# Patient Record
Sex: Female | Born: 1940
Health system: Southern US, Community
[De-identification: ages and names within clinical notes are randomized; demographics above are authoritative.]

## PROBLEM LIST (undated history)

## (undated) DIAGNOSIS — D509 Iron deficiency anemia, unspecified: Secondary | ICD-10-CM

## (undated) DIAGNOSIS — H3530 Unspecified macular degeneration: Secondary | ICD-10-CM

## (undated) DIAGNOSIS — F0390 Unspecified dementia without behavioral disturbance: Secondary | ICD-10-CM

## (undated) DIAGNOSIS — M810 Age-related osteoporosis without current pathological fracture: Secondary | ICD-10-CM

## (undated) DIAGNOSIS — M1990 Unspecified osteoarthritis, unspecified site: Secondary | ICD-10-CM

## (undated) DIAGNOSIS — Z973 Presence of spectacles and contact lenses: Secondary | ICD-10-CM

## (undated) DIAGNOSIS — S32010A Wedge compression fracture of first lumbar vertebra, initial encounter for closed fracture: Secondary | ICD-10-CM

## (undated) HISTORY — PX: COLONOSCOPY: SHX174

## (undated) HISTORY — PX: CARDIAC CATHETERIZATION: SHX172

## (undated) HISTORY — PX: TUBAL LIGATION: SHX77

## (undated) HISTORY — PX: FOOT SURGERY: SHX648

## (undated) HISTORY — DX: Iron deficiency anemia, unspecified: D50.9

---

## 1961-10-15 HISTORY — PX: GANGLION CYST EXCISION: SHX1691

## 1998-02-10 ENCOUNTER — Other Ambulatory Visit: Admission: RE | Admit: 1998-02-10 | Discharge: 1998-02-10 | Payer: Self-pay | Admitting: Obstetrics & Gynecology

## 1999-04-04 ENCOUNTER — Other Ambulatory Visit: Admission: RE | Admit: 1999-04-04 | Discharge: 1999-04-04 | Payer: Self-pay | Admitting: Obstetrics & Gynecology

## 1999-04-12 ENCOUNTER — Emergency Department (HOSPITAL_COMMUNITY): Admission: EM | Admit: 1999-04-12 | Discharge: 1999-04-12 | Payer: Self-pay

## 1999-08-21 ENCOUNTER — Observation Stay (HOSPITAL_COMMUNITY): Admission: RE | Admit: 1999-08-21 | Discharge: 1999-08-22 | Payer: Self-pay | Admitting: Orthopedic Surgery

## 2001-03-20 ENCOUNTER — Other Ambulatory Visit: Admission: RE | Admit: 2001-03-20 | Discharge: 2001-03-20 | Payer: Self-pay | Admitting: Obstetrics & Gynecology

## 2002-06-16 ENCOUNTER — Other Ambulatory Visit: Admission: RE | Admit: 2002-06-16 | Discharge: 2002-06-16 | Payer: Self-pay | Admitting: Obstetrics & Gynecology

## 2003-07-15 ENCOUNTER — Other Ambulatory Visit: Admission: RE | Admit: 2003-07-15 | Discharge: 2003-07-15 | Payer: Self-pay | Admitting: Obstetrics & Gynecology

## 2004-07-12 ENCOUNTER — Other Ambulatory Visit: Admission: RE | Admit: 2004-07-12 | Discharge: 2004-07-12 | Payer: Self-pay | Admitting: Obstetrics & Gynecology

## 2004-11-17 ENCOUNTER — Ambulatory Visit: Payer: Self-pay | Admitting: Internal Medicine

## 2004-12-08 ENCOUNTER — Ambulatory Visit: Payer: Self-pay | Admitting: Internal Medicine

## 2004-12-29 ENCOUNTER — Ambulatory Visit: Payer: Self-pay | Admitting: Internal Medicine

## 2005-03-09 ENCOUNTER — Ambulatory Visit: Payer: Self-pay | Admitting: Internal Medicine

## 2005-05-24 ENCOUNTER — Encounter: Admission: RE | Admit: 2005-05-24 | Discharge: 2005-05-24 | Payer: Self-pay | Admitting: Orthopedic Surgery

## 2005-05-28 ENCOUNTER — Encounter: Admission: RE | Admit: 2005-05-28 | Discharge: 2005-05-28 | Payer: Self-pay | Admitting: Orthopedic Surgery

## 2005-07-16 ENCOUNTER — Other Ambulatory Visit: Admission: RE | Admit: 2005-07-16 | Discharge: 2005-07-16 | Payer: Self-pay | Admitting: Obstetrics & Gynecology

## 2007-09-29 ENCOUNTER — Encounter: Payer: Self-pay | Admitting: Internal Medicine

## 2007-09-29 ENCOUNTER — Ambulatory Visit: Payer: Self-pay | Admitting: Internal Medicine

## 2007-09-29 DIAGNOSIS — M810 Age-related osteoporosis without current pathological fracture: Secondary | ICD-10-CM

## 2007-09-29 DIAGNOSIS — M25476 Effusion, unspecified foot: Secondary | ICD-10-CM

## 2007-09-29 DIAGNOSIS — M25473 Effusion, unspecified ankle: Secondary | ICD-10-CM

## 2007-09-29 DIAGNOSIS — R609 Edema, unspecified: Secondary | ICD-10-CM

## 2007-09-29 DIAGNOSIS — D649 Anemia, unspecified: Secondary | ICD-10-CM

## 2007-09-30 ENCOUNTER — Encounter: Payer: Self-pay | Admitting: Internal Medicine

## 2007-09-30 ENCOUNTER — Telehealth: Payer: Self-pay | Admitting: Internal Medicine

## 2007-09-30 LAB — CONVERTED CEMR LAB
BUN: 15 mg/dL (ref 6–23)
Basophils Absolute: 0.1 10*3/uL (ref 0.0–0.1)
Basophils Relative: 0.8 % (ref 0.0–1.0)
CO2: 29 meq/L (ref 19–32)
CRP, High Sensitivity: <1 — ABNORMAL LOW (ref 0.00–5.00)
Calcium: 8.7 mg/dL (ref 8.4–10.5)
Chloride: 107 meq/L (ref 96–112)
Creatinine, Ser: 0.7 mg/dL (ref 0.4–1.2)
Eosinophils Absolute: 2.5 10*3/uL — ABNORMAL HIGH (ref 0.0–0.6)
Eosinophils Relative: 32 % — ABNORMAL HIGH (ref 0.0–5.0)
GFR calc Af Amer: 108 mL/min
GFR calc non Af Amer: 89 mL/min
Glucose, Bld: 91 mg/dL (ref 70–99)
HCT: 32.2 % — ABNORMAL LOW (ref 36.0–46.0)
Hemoglobin: 10.5 g/dL — ABNORMAL LOW (ref 12.0–15.0)
Iron: 15 ug/dL — ABNORMAL LOW (ref 42–145)
Lymphocytes Relative: 28.2 % (ref 12.0–46.0)
MCHC: 32.6 g/dL (ref 30.0–36.0)
MCV: 87.7 fL (ref 78.0–100.0)
Monocytes Absolute: 0.7 10*3/uL (ref 0.2–0.7)
Monocytes Relative: 8.4 % (ref 3.0–11.0)
Neutro Abs: 2.4 10*3/uL (ref 1.4–7.7)
Neutrophils Relative %: 30.6 % — ABNORMAL LOW (ref 43.0–77.0)
Platelets: 340 10*3/uL (ref 150–400)
Potassium: 4.3 meq/L (ref 3.5–5.1)
RBC: 3.67 M/uL — ABNORMAL LOW (ref 3.87–5.11)
RDW: 14.8 % — ABNORMAL HIGH (ref 11.5–14.6)
Sodium: 142 meq/L (ref 135–145)
Vit D, 1,25-Dihydroxy: 83 (ref 30–89)
WBC: 7.9 10*3/uL (ref 4.5–10.5)

## 2007-10-02 ENCOUNTER — Telehealth: Payer: Self-pay | Admitting: Internal Medicine

## 2007-10-15 ENCOUNTER — Telehealth: Payer: Self-pay | Admitting: Family Medicine

## 2008-01-12 ENCOUNTER — Ambulatory Visit: Payer: Self-pay | Admitting: Internal Medicine

## 2008-01-12 ENCOUNTER — Encounter: Payer: Self-pay | Admitting: *Deleted

## 2008-01-12 DIAGNOSIS — R634 Abnormal weight loss: Secondary | ICD-10-CM | POA: Insufficient documentation

## 2008-01-12 DIAGNOSIS — R079 Chest pain, unspecified: Secondary | ICD-10-CM

## 2008-01-12 LAB — CONVERTED CEMR LAB
BUN: 12 mg/dL (ref 6–23)
Basophils Absolute: 0 10*3/uL (ref 0.0–0.1)
Basophils Relative: 0.3 % (ref 0.0–1.0)
CK-MB: 2.6 ng/mL (ref 0.3–4.0)
CO2: 31 meq/L (ref 19–32)
Calcium: 8.7 mg/dL (ref 8.4–10.5)
Chloride: 103 meq/L (ref 96–112)
Creatinine, Ser: 0.7 mg/dL (ref 0.4–1.2)
Eosinophils Absolute: 0.2 10*3/uL (ref 0.0–0.7)
Eosinophils Relative: 1.5 % (ref 0.0–5.0)
GFR calc Af Amer: 108 mL/min
GFR calc non Af Amer: 89 mL/min
Glucose, Bld: 94 mg/dL (ref 70–99)
HCT: 42.1 % (ref 36.0–46.0)
Hemoglobin: 13.4 g/dL (ref 12.0–15.0)
Hgb A1c MFr Bld: 5.8 % (ref 4.6–6.0)
Lymphocytes Relative: 8.5 % — ABNORMAL LOW (ref 12.0–46.0)
MCHC: 31.9 g/dL (ref 30.0–36.0)
MCV: 90 fL (ref 78.0–100.0)
Monocytes Absolute: 1.6 10*3/uL — ABNORMAL HIGH (ref 0.1–1.0)
Monocytes Relative: 11.3 % (ref 3.0–12.0)
Neutro Abs: 10.8 10*3/uL — ABNORMAL HIGH (ref 1.4–7.7)
Neutrophils Relative %: 78.4 % — ABNORMAL HIGH (ref 43.0–77.0)
Platelets: 258 10*3/uL (ref 150–400)
Potassium: 3.6 meq/L (ref 3.5–5.1)
RBC: 4.68 M/uL (ref 3.87–5.11)
RDW: 13.3 % (ref 11.5–14.6)
Sodium: 141 meq/L (ref 135–145)
TSH: 1.97 microintl units/mL (ref 0.35–5.50)
Total CK: 204 units/L (ref 7–177)
Vit D, 1,25-Dihydroxy: 77 (ref 30–89)
WBC: 13.8 10*3/uL — ABNORMAL HIGH (ref 4.5–10.5)

## 2008-01-14 ENCOUNTER — Telehealth: Payer: Self-pay | Admitting: *Deleted

## 2008-07-23 ENCOUNTER — Encounter: Payer: Self-pay | Admitting: *Deleted

## 2008-11-13 LAB — CONVERTED CEMR LAB: Pap Smear: NORMAL

## 2009-02-18 ENCOUNTER — Telehealth: Payer: Self-pay | Admitting: Internal Medicine

## 2009-03-15 ENCOUNTER — Telehealth (INDEPENDENT_AMBULATORY_CARE_PROVIDER_SITE_OTHER): Payer: Self-pay | Admitting: *Deleted

## 2009-05-02 ENCOUNTER — Encounter: Payer: Self-pay | Admitting: Internal Medicine

## 2009-06-22 ENCOUNTER — Encounter (INDEPENDENT_AMBULATORY_CARE_PROVIDER_SITE_OTHER): Payer: Self-pay | Admitting: *Deleted

## 2009-06-29 ENCOUNTER — Ambulatory Visit: Payer: Self-pay | Admitting: Internal Medicine

## 2009-07-12 ENCOUNTER — Ambulatory Visit: Payer: Self-pay | Admitting: Internal Medicine

## 2009-08-12 ENCOUNTER — Ambulatory Visit: Payer: Self-pay | Admitting: Internal Medicine

## 2009-08-12 LAB — CONVERTED CEMR LAB: PTH: 17.8 pg/mL (ref 14.0–72.0)

## 2009-08-17 LAB — CONVERTED CEMR LAB
Alkaline Phosphatase: 52 units/L (ref 39–117)
BUN: 20 mg/dL (ref 6–23)
Basophils Absolute: 0.1 10*3/uL (ref 0.0–0.1)
Bilirubin, Direct: 0 mg/dL (ref 0.0–0.3)
CO2: 31 meq/L (ref 19–32)
Calcium: 8.7 mg/dL (ref 8.4–10.5)
Creatinine, Ser: 0.7 mg/dL (ref 0.4–1.2)
Eosinophils Absolute: 1.5 10*3/uL — ABNORMAL HIGH (ref 0.0–0.7)
Folate: >20 ng/mL
Glucose, Bld: 80 mg/dL (ref 70–99)
Iron: 127 ug/dL (ref 42–145)
Lymphocytes Relative: 23.1 % (ref 12.0–46.0)
MCHC: 33.3 g/dL (ref 30.0–36.0)
Monocytes Absolute: 0.5 10*3/uL (ref 0.1–1.0)
Neutrophils Relative %: 49.6 % (ref 43.0–77.0)
Platelets: 243 10*3/uL (ref 150.0–400.0)
RBC: 4.37 M/uL (ref 3.87–5.11)
RDW: 12.8 % (ref 11.5–14.6)
Saturation Ratios: 35 % (ref 20.0–50.0)
Total Bilirubin: 1 mg/dL (ref 0.3–1.2)
Transferrin: 259.5 mg/dL (ref 212.0–360.0)

## 2009-10-14 LAB — CONVERTED CEMR LAB: Pap Smear: NORMAL

## 2009-11-09 ENCOUNTER — Telehealth: Payer: Self-pay | Admitting: Internal Medicine

## 2009-12-26 ENCOUNTER — Telehealth: Payer: Self-pay | Admitting: Internal Medicine

## 2009-12-28 ENCOUNTER — Ambulatory Visit: Payer: Self-pay | Admitting: Internal Medicine

## 2009-12-28 ENCOUNTER — Telehealth: Payer: Self-pay | Admitting: Internal Medicine

## 2009-12-29 ENCOUNTER — Encounter: Payer: Self-pay | Admitting: Internal Medicine

## 2010-04-12 ENCOUNTER — Telehealth: Payer: Self-pay | Admitting: Internal Medicine

## 2010-04-12 ENCOUNTER — Ambulatory Visit: Payer: Self-pay

## 2010-04-12 ENCOUNTER — Encounter: Payer: Self-pay | Admitting: Internal Medicine

## 2010-04-12 DIAGNOSIS — M79609 Pain in unspecified limb: Secondary | ICD-10-CM

## 2010-06-20 ENCOUNTER — Telehealth: Payer: Self-pay | Admitting: *Deleted

## 2010-08-21 ENCOUNTER — Encounter: Payer: Self-pay | Admitting: Internal Medicine

## 2010-08-21 ENCOUNTER — Ambulatory Visit: Payer: Self-pay | Admitting: Internal Medicine

## 2010-08-21 DIAGNOSIS — I8310 Varicose veins of unspecified lower extremity with inflammation: Secondary | ICD-10-CM

## 2010-08-21 LAB — CONVERTED CEMR LAB
ALT: 31 units/L (ref 0–35)
AST: 45 units/L — ABNORMAL HIGH (ref 0–37)
Alkaline Phosphatase: 49 units/L (ref 39–117)
BUN: 19 mg/dL (ref 6–23)
Basophils Absolute: 0.1 10*3/uL (ref 0.0–0.1)
Bilirubin, Direct: 0.1 mg/dL (ref 0.0–0.3)
Calcium: 9 mg/dL (ref 8.4–10.5)
Eosinophils Relative: 15 % — ABNORMAL HIGH (ref 0.0–5.0)
GFR calc non Af Amer: 85.62 mL/min (ref >60)
Glucose, Bld: 76 mg/dL (ref 70–99)
HCT: 35.7 % — ABNORMAL LOW (ref 36.0–46.0)
HDL: 77.5 mg/dL (ref >39.00)
LDL Cholesterol: 91 mg/dL (ref 0–99)
Lymphocytes Relative: 30 % (ref 12.0–46.0)
Lymphs Abs: 2.3 10*3/uL (ref 0.7–4.0)
Monocytes Relative: 8.7 % (ref 3.0–12.0)
Platelets: 278 10*3/uL (ref 150.0–400.0)
Potassium: 4.5 meq/L (ref 3.5–5.1)
RDW: 15.8 % — ABNORMAL HIGH (ref 11.5–14.6)
Sodium: 142 meq/L (ref 135–145)
Total Bilirubin: 0.7 mg/dL (ref 0.3–1.2)
VLDL: 7.2 mg/dL (ref 0.0–40.0)
WBC: 7.8 10*3/uL (ref 4.5–10.5)

## 2010-11-05 ENCOUNTER — Encounter: Payer: Self-pay | Admitting: Internal Medicine

## 2010-11-16 NOTE — Progress Notes (Signed)
Summary: REQ FOR CPX APPT  Phone Note Call from Patient Call back at Home Phone [phone]   Caller: Patient Call For: Stacie Glaze MD Reason for Call: Talk to Doctor Action Taken: Patient advised to call 911 Summary of Call: pt. wants to see if she can come in for a CPX before 09-11-10. She would like to see if she can get an appt. for the first week of November & if it could be done in the morning as she has to do labs and physicals all in the same day. Please call and let her know. Initial call taken by: Georgian Co,  June 20, 2010 2:19 PM  Follow-up for Phone Call        Insurance may not pay if before 10/29 - last cpx 08/12/2009. ok to schedule any time after that date.  forward to rhonda to schedule. KIK Follow-up by: Duard Brady LPN,  June 20, 2010 3:06 PM  Additional Follow-up for Phone Call Additional follow up Details #1::        I called pt and scheduled a cpx appt w/ Dr Lovell Sheehan for Monday, 11/7  @  11:15am  (pt will come in fasting for labwork)...... Pt was informed that this was a work-in and that this would be sent to [facility], LPN for final approval (if there is a conflict in schedule, I will contact to advise).   Additional Follow-up by: Debbra Riding,  June 21, 2010 8:25 AM    Additional Follow-up for Phone Call Additional follow up Details #2::    perfect!! Follow-up by: Willy Eddy, LPN,  June 27, 2010 8:01 AM

## 2010-11-16 NOTE — Progress Notes (Signed)
Summary: continues with diarrhea  Phone Note Call from Patient   Caller: Patient Call For: Stacie Glaze MD Summary of Call: Pt continues with diarrhea, and did not have control of her bowels during the night last night.  Taking Zofran, but no RX for diarrhea.  No fever, and vomiting has stopped.  Lost 8 lbs in 3 days.   [phone] Initial call taken by: Lynann Beaver CMA,  December 28, 2009 8:31 AM  Follow-up for Phone Call        per dr Lovell Sheehan- may have lomotil 1 after each loose stool not to exceed 6per day- have husband come and pick up stool container for o and p, cu ltrue, c diff,giardia Follow-up by: Willy Eddy, LPN,  December 28, 2009 8:53 AM    New/Updated Medications: LOMOTIL 2.5-0.025 MG TABS (DIPHENOXYLATE-ATROPINE) one by mouth after each BM not to exceed 6 pr day. Prescriptions: LOMOTIL 2.5-0.025 MG TABS (DIPHENOXYLATE-ATROPINE) one by mouth after each BM not to exceed 6 pr day.  #30 x 0   Entered by:   Lynann Beaver CMA   Authorized by:   Stacie Glaze MD   Signed by:   Lynann Beaver CMA on 12/28/2009   Method used:   Telephoned to ...       [facility]  [address] [phone]* (retail)       [address]       [place], [place]  [address]       Ph: [phone] or [phone]       Fax: [phone]   RxID:   [id number]  Pt notified.  Appended Document: continues with diarrhea Pt has appt with the Dentist today to replace a broken crown.  Wants to know if she is contagious, and if she should cancel. [phone]

## 2010-11-16 NOTE — Progress Notes (Signed)
Summary: Vomiting and Diarrhea  Phone Note Call from Patient   Caller: Patient Call For: Stacie Glaze MD Reason for Call: Acute Illness Complaint: Nausea/Vomiting/Diarrhea Summary of Call: Pt woke up at 1 am with nausea, vomiting and diarrhea.  No fever.  Used a Phenergan Supp that did not help.  Would like RX for diarrhea and vomiting called to [facility] [facility] [address] [place] [phone] Initial call taken by: Lynann Beaver CMA,  December 26, 2009 9:01 AM  Follow-up for Phone Call        clear liquid for 48 hours may have generic zofran 4 mg 1 every 6 hours as needed #12- per dr Lovell Sheehan Follow-up by: Willy Eddy, LPN,  December 26, 2009 10:00 AM    New/Updated Medications: ZOFRAN 4 MG TABS (ONDANSETRON HCL) one by mouth q 6 hours as needed nausea / vomiting. Prescriptions: ZOFRAN 4 MG TABS (ONDANSETRON HCL) one by mouth q 6 hours as needed nausea / vomiting.  #12 x 0   Entered by:   Lynann Beaver CMA   Authorized by:   Stacie Glaze MD   Signed by:   Lynann Beaver CMA on 12/26/2009   Method used:   Electronically to        [address] [phone]* (retail)       [address]       [place], [place]  [address]       Ph: [phone] or [phone]       Fax: [phone]   RxID:   [id number]  Pt notified.

## 2010-11-16 NOTE — Assessment & Plan Note (Signed)
Summary: CPX (PT WILL COME IN FASTING) // RS   Vital Signs:  Patient profile:   70 year old female Height:      65 inches Weight:      154 pounds BMI:     25.72 Temp:     98.1 degrees F oral Pulse rate:   72 / minute Resp:     14 per minute BP sitting:   120 / 70  (left arm)  Vitals Entered By: Willy Eddy, LPN (August 21, 2010 11:36 AM) CC: annual visit for disease managment Is Patient Diabetic? No   Primary Care Janiece Scovill:  Stacie Glaze MD  CC:  annual visit for disease managment.  History of Present Illness: the pt has occasional upper chest discomfort that will responde to tums but ocassionally has a cold sweat.... and feels nervous... these symptoms last a few minutes the pt may have PAD The pt was asked about all immunizations, health maint. services that are appropriate to their age and was given guidance on diet exercize  and weight management   Preventive Screening-Counseling & Management  Alcohol-Tobacco     Smoking Status: never     Tobacco Counseling: not indicated; no tobacco use  Problems Prior to Update: 1)  Leg Pain, Left  (ICD-729.5) 2)  Preventive Health Care  (ICD-V70.0) 3)  Chest Pain Unspecified  (ICD-786.50) 4)  Weight Loss, Recent  (ICD-783.21) 5)  Osteopenia  (ICD-733.90) 6)  Iron Deficiency Anemia Secondary To Blood Loss  (ICD-280.0) 7)  Effusion of Ankle and Foot Joint  (ICD-719.07) 8)  Ankle Edema  (ICD-782.3) 9)  Edema  (ICD-782.3)  Current Problems (verified): 1)  Leg Pain, Left  (ICD-729.5) 2)  Preventive Health Care  (ICD-V70.0) 3)  Chest Pain Unspecified  (ICD-786.50) 4)  Weight Loss, Recent  (ICD-783.21) 5)  Osteopenia  (ICD-733.90) 6)  Iron Deficiency Anemia Secondary To Blood Loss  (ICD-280.0) 7)  Effusion of Ankle and Foot Joint  (ICD-719.07) 8)  Ankle Edema  (ICD-782.3) 9)  Edema  (ICD-782.3)  Medications Prior to Update: 1)  Multivitamins   Tabs (Multiple Vitamin) .... Take 1 By Mouth Qd 2)  Super B Complex   Tabs (B Complex-C) .Marland Kitchen.. 1 Once Daily 3)  Caltrate 600 1500 Mg Tabs (Calcium Carbonate) .... Once Daily 4)  Promethazine Hcl 25 Mg Supp (Promethazine Hcl) .... Insert Rectally Q 6 Hrs As Needed Nausea and Vomiting 5)  Zofran 4 Mg Tabs (Ondansetron Hcl) .... One By Mouth Q 6 Hours As Needed Nausea / Vomiting. 6)  Lomotil 2.5-0.025 Mg Tabs (Diphenoxylate-Atropine) .... One By Mouth After Each Bm Not To Exceed 6 Pr Day.  Current Medications (verified): 1)  Multivitamins   Tabs (Multiple Vitamin) .... Take 1 By Mouth Qd 2)  Super B Complex  Tabs (B Complex-C) .Marland Kitchen.. 1 Once Daily 3)  Caltrate 600 1500 Mg Tabs (Calcium Carbonate) .... Once Daily 4)  Icaps Areds Formula  Tabs (Multiple Vitamins-Minerals) .Marland Kitchen.. 1 Two Times A Day 5)  Vitamin C 1000 Mg Tabs (Ascorbic Acid) .... Once Daily 6)  Vitamin D3 1000 Unit Tabs (Cholecalciferol) .... Once Daily 7)  Potassium Gluconate 595 Mg Tabs (Potassium Gluconate) .Marland Kitchen.. 1 Once Daily 8)  Super B Complex  Tabs (B Complex-C) .Marland Kitchen.. 1 Once Daily 9)  Biotin 1000 Mcg Tabs (Biotin) .Marland Kitchen.. 1 Once Daily 10)  Magnesium 200 Mg Tabs (Magnesium) .Marland Kitchen.. 1 Once Daily 11)  Fish Oil 1000 Mg Caps (Omega-3 Fatty Acids) .Marland Kitchen.. 1 Two Times A Day 12)  Magnesium 200 Mg Tabs (Magnesium) .... Once Daily  Allergies: 1)  ! Codeine  Past History:  Social History: Last updated: 09/29/2007 Married Never Smoked Alcohol use-no  Risk Factors: Smoking Status: never (08/21/2010)  Past medical, surgical, family and social histories (including risk factors) reviewed, and no changes noted (except as noted below).  Past Medical History: Reviewed history from 09/29/2007 and no changes required. menopause  Past Surgical History: Reviewed history from 09/29/2007 and no changes required. Denies surgical history  Family History: Reviewed history and no changes required.  Social History: Reviewed history from 09/29/2007 and no changes required. Married Never Smoked Alcohol  use-no  Review of Systems  The patient denies anorexia, fever, weight loss, weight gain, vision loss, decreased hearing, hoarseness, chest pain, syncope, dyspnea on exertion, peripheral edema, prolonged cough, headaches, hemoptysis, abdominal pain, melena, hematochezia, severe indigestion/heartburn, hematuria, incontinence, genital sores, muscle weakness, suspicious skin lesions, transient blindness, difficulty walking, depression, unusual weight change, abnormal bleeding, enlarged lymph nodes, angioedema, and breast masses.    Physical Exam  General:  Well-developed,well-nourished,in no acute distress; alert,appropriate and cooperative throughout examination Head:  Normocephalic and atraumatic without obvious abnormalities. No apparent alopecia or balding. Eyes:  pupils equal and pupils round.   Ears:  R ear normal and L ear normal.   Nose:  no external deformity and no nasal discharge.   Mouth:  Oral mucosa and oropharynx without lesions or exudates.  Teeth in good repair. Neck:  No deformities, masses, or tenderness noted. Chest Wall:  No deformities, masses, or tenderness noted. Lungs:  normal respiratory effort and no wheezes.   Heart:  normal rate and no murmur.   Abdomen:  soft and non-tender.   Msk:  normal ROM.   Extremities:  trace left pedal edema and trace right pedal edema.   Neurologic:  alert & oriented X3 and DTRs symmetrical and normal.     Impression & Recommendations:  Problem # 1:  PREVENTIVE HEALTH CARE (ICD-V70.0)  Orders: EKG w/ Interpretation (93000)  Mammogram: normal (10/14/2009) Pap smear: normal (10/14/2009) [facility]oscopy: [facility]   (07/12/2009) Td Booster: Historical (09/15/2007)   Flu Vax: Historical (08/15/2010)   Pneumovax: Pneumovax (State) (08/21/2010) TSH: 1.71 (08/12/2009)   HgbA1C: 5.8 (01/12/2008)   Next mammogram due:: 10/2010 (08/21/2010) Next Colonoscopy due:: 07/2019 (07/12/2009)  Discussed using sunscreen,  use of alcohol, drug use, self breast exam, routine dental care, routine eye care, schedule for GYN exam, routine physical exam, seat belts, multiple vitamins, osteoporosis prevention, adequate calcium intake in diet, recommendations for immunizations, mammograms and Pap smears.  Discussed exercise and checking cholesterol.  Discussed gun safety, safe sex, and contraception.  Problem # 2:  ASYMPTOMATIC VARICOSE VEINS (ICD-454.9)  Complete Medication List: 1)  Multivitamins Tabs (Multiple vitamin) .... Take 1 by mouth qd 2)  Super B Complex Tabs (B complex-c) .Marland Kitchen.. 1 once daily 3)  Caltrate 600 1500 Mg Tabs (Calcium carbonate) .... Once daily 4)  Icaps Areds Formula Tabs (Multiple vitamins-minerals) .Marland Kitchen.. 1 two times a day 5)  Vitamin C 1000 Mg Tabs (Ascorbic acid) .... Once daily 6)  Vitamin D3 1000 Unit Tabs (Cholecalciferol) .... Once daily 7)  Potassium Gluconate 595 Mg Tabs (Potassium gluconate) .Marland Kitchen.. 1 once daily 8)  Super B Complex Tabs (B complex-c) .Marland Kitchen.. 1 once daily 9)  Biotin 1000 Mcg Tabs (Biotin) .Marland Kitchen.. 1 once daily 10)  Magnesium 200 Mg Tabs (Magnesium) .Marland Kitchen.. 1 once daily 11)  Fish Oil 1000 Mg Caps (Omega-3 fatty acids) .Marland Kitchen.. 1 two  times a day 12)  Magnesium 200 Mg Tabs (Magnesium) .... Once daily 13)  Clobetasol Propionate 0.05 % Oint (Clobetasol propionate) .... Apply as needed  Other Orders: State-Pneumococcal Vaccine (16109U) Admin 1st Vaccine (04540) Venipuncture (98119) TLB-Lipid Panel (80061-LIPID) TLB-BMP (Basic Metabolic Panel-BMET) (80048-METABOL) TLB-CBC Platelet - w/Differential (85025-CBCD) TLB-Hepatic/Liver Function Pnl (80076-HEPATIC) TLB-TSH (Thyroid Stimulating Hormone) (84443-TSH)   Patient Instructions: 1)  compression stocking in  [place] 2)  small snack between meals 3)  then if the sweats and chest discomfort persist... please call and lets do a stress test  4)  Please schedule a follow-up appointment in 3 months. Prescriptions: CLOBETASOL PROPIONATE  0.05 % OINT (CLOBETASOL PROPIONATE) apply as needed  #60 gm x 1   Entered and Authorized by:   Stacie Glaze MD   Signed by:   Stacie Glaze MD on 08/21/2010   Method used:   [facility] to        [facility]  [address] [facility] [address] [phone]* (retail)       [address]       [place], [place]  [address]       Ph: [phone] or [phone]       Fax: [phone]   RxID:   [id number]    Orders Added: 1)  EKG w/ Interpretation [93000] 2)  State-Pneumococcal Vaccine [90732S] 3)  Admin 1st Vaccine [90471] 4)  Est. Patient 65& > [99397] 5)  Venipuncture [36415] 6)  TLB-Lipid Panel [80061-LIPID] 7)  TLB-BMP (Basic Metabolic Panel-BMET) [80048-METABOL] 8)  TLB-CBC Platelet - w/Differential [85025-CBCD] 9)  TLB-Hepatic/Liver Function Pnl [80076-HEPATIC] 10)  TLB-TSH (Thyroid Stimulating Hormone) [87564-PPI]   Immunization History:  Influenza Immunization History:    Influenza:  historical (08/15/2010)  Immunizations Administered:  Pneumonia Vaccine:    Vaccine Type: Pneumovax (State)    Site: left deltoid    Mfr: Merck    Dose: 0.5 ml    Route: IM    Given by: Willy Eddy, LPN    Exp. Date: 02/08/2012    Lot #: [id number]    VIS given: 09/19/09 version given August 21, 2010.   Immunization History:  Influenza Immunization History:    Influenza:  Historical (08/15/2010)  Immunizations Administered:  Pneumonia Vaccine:    Vaccine Type: Pneumovax (State)    Site: left deltoid    Mfr: Merck    Dose: 0.5 ml    Route: IM    Given by: Willy Eddy, LPN    Exp. Date: 02/08/2012    Lot #: [id number]    VIS given: 09/19/09 version given August 21, 2010.     Preventive Care Screening  Mammogram:    Date:  10/14/2009    Next Due:  10/2010    Results:  normal   Pap Smear:    Date:  10/14/2009    Next Due:  10/2010    Results:  normal   Last Pneumovax:    Date:  08/21/2010    Results:  Pneumovax (State)  Last Flu Shot:    Date:   08/15/2010    Results:  Historical   Appended Document: Orders Update    Clinical Lists Changes  Orders: Added new Service order of Specimen Handling (95188) - Signed Added new Test order of TLB-Lipid Panel (80061-LIPID) - Signed Added new Test order of TLB-BMP (Basic Metabolic Panel-BMET) (80048-METABOL) - Signed Added new Test order of TLB-CBC Platelet - w/Differential (85025-CBCD) - Signed Added new Test order of TLB-Hepatic/Liver  Function Pnl (80076-HEPATIC) - Signed Added new Test order of TLB-TSH (Thyroid Stimulating Hormone) (84443-TSH) - Signed

## 2010-11-16 NOTE — Progress Notes (Signed)
Summary: GI virus  Phone Note From Other Clinic   Caller: Patient Call For: Stacie Glaze MD Reason for Call: Acute Illness Complaint: Nausea/Vomiting/Diarrhea Action Taken: Provider Notified Summary of Call: Pt. started with severe headache last night with nausea and vomiting all night.  She would like RX for headache and Nausea.  No fever today yet. [facility] [address] [phone] Initial call taken by: Lynann Beaver CMA,  November 09, 2009 8:30 AM  Follow-up for Phone Call        per dr Lovell Sheehan- headache coming from dehydration- may have pheneergan supp 25 1 every 6 hours as needed #9-drink gatoraid for dehydration Follow-up by: Willy Eddy, LPN,  November 09, 2009 9:20 AM    New/Updated Medications: PROMETHAZINE HCL 25 MG SUPP (PROMETHAZINE HCL) Insert rectally q 6 hrs as needed nausea and vomiting Prescriptions: PROMETHAZINE HCL 25 MG SUPP (PROMETHAZINE HCL) Insert rectally q 6 hrs as needed nausea and vomiting  #9 x 0   Entered by:   Lynann Beaver CMA   Authorized by:   Stacie Glaze MD   Signed by:   Lynann Beaver CMA on 11/09/2009   Method used:   [facility]        [facility] [address] [phone]* (retail)       [address]       [place], [place]  [address]       Ph: [phone] or [phone]       Fax: [phone]   RxID:   [id number]  Pt. notified.

## 2010-11-16 NOTE — Progress Notes (Signed)
Summary: ov for swollen legs & left leg painful  Phone Note Call from Patient Call back at Home Phone [phone]   Reason for Call: Insurance Question Summary of Call: Thought she had hurt leg from exercise.  Had jury duty yesterday.  Ankles huge last night.  Left swollen all way up leg.  Left leg hurts all up & down.  Sore since last week & now more painful.  Can't put pressure left leg.  No particular area of concern.  Veins around ankles that were blue are red.  Aleve helps sometimes, but not a lot.  Not on diet pill or anything.  Parsley tea no help with the swelling.  Request ov today or tomorrow.   Initial call taken by: Rudy Jew, RN,  April 12, 2010 1:07 PM  Follow-up for Phone Call        per dr Leonidas Romberg today Follow-up by: Willy Eddy, LPN,  April 12, 2010 1:31 PM  New Problems: LEG PAIN, LEFT (ICD-729.5)   New Problems: LEG PAIN, LEFT (ICD-729.5)  Appended Document: ov for swollen legs & left leg painful doppler was negative- per dr Lovell Sheehan try motrin (pt prefers aleve) stay off of it and elevate and if not a lot better by friday call back

## 2010-11-16 NOTE — Miscellaneous (Signed)
Summary: Orders Update  Clinical Lists Changes  Orders: Added new Test order of Venous Duplex Lower Extremity (Venous Duplex Lower) - Signed 

## 2011-03-03 ENCOUNTER — Ambulatory Visit (INDEPENDENT_AMBULATORY_CARE_PROVIDER_SITE_OTHER): Payer: Medicare Other | Admitting: Family Medicine

## 2011-03-03 VITALS — BP 118/62 | HR 84 | Temp 98.4°F | Wt 154.0 lb

## 2011-03-03 DIAGNOSIS — H109 Unspecified conjunctivitis: Secondary | ICD-10-CM

## 2011-03-03 DIAGNOSIS — H10029 Other mucopurulent conjunctivitis, unspecified eye: Secondary | ICD-10-CM

## 2011-03-03 MED ORDER — TOBRAMYCIN 0.3 % OP SOLN: 1.0000 [drp] | OPHTHALMIC | Status: AC

## 2011-03-03 NOTE — Patient Instructions (Signed)

## 2011-03-03 NOTE — Progress Notes (Signed)
  Subjective:    Patient ID: Julia Palmer, female    DOB: 1941/07/16, 71 y.o.   MRN: [id number]  HPI Patient seen with redness left eye greater than right. Started yesterday. No injury. No contact use. No blurred vision. Has some pruritus. No eye pain. Increased matting this morning. Had some recent sinus drainage and occasional productive cough. No fever.   Review of Systems  HENT: Positive for voice change. Negative for ear pain and sore throat.   Eyes: Positive for discharge, redness and itching. Negative for photophobia, pain and visual disturbance.  Respiratory: Positive for cough. Negative for shortness of breath.        Objective:   Physical Exam  Constitutional: She appears well-developed and well-nourished. No distress.  HENT:  Right Ear: External ear normal.  Left Ear: External ear normal.  Mouth/Throat: Oropharynx is clear and moist. No oropharyngeal exudate.  Eyes: Pupils are equal, round, and reactive to light.       Patient has bilateral conjunctival erythema left greater than right. She has some yellow crusted mattering from both. No corneal defects.  Cardiovascular: Normal rate, regular rhythm and normal heart sounds.   Pulmonary/Chest: Effort normal and breath sounds normal. No respiratory distress. She has no wheezes. She has no rales.  Skin: No rash noted.          Assessment & Plan:  Bacterial conjunctivitis. Tobramycin eye drops and warm compresses.

## 2011-03-06 ENCOUNTER — Telehealth: Payer: Self-pay | Admitting: *Deleted

## 2011-03-06 MED ORDER — AZITHROMYCIN 500 MG PO TABS: 500.0000 mg | ORAL_TABLET | Freq: Every day | ORAL | Status: AC

## 2011-03-06 NOTE — Telephone Encounter (Signed)
Notified pt all meds have been sent in.

## 2011-03-06 NOTE — Telephone Encounter (Signed)
I have sent in a prescription for azithromycin please recommend that she use Zyrtec-D 12 hours twice daily

## 2011-03-06 NOTE — Telephone Encounter (Signed)
I have sent in azithromycin and recommended she use over-the-counter Zyrtec-D

## 2011-03-06 NOTE — Telephone Encounter (Signed)
Ear ache, congestion, pink eye, and coughing, cheeks are swollen x one week. [facility])

## 2011-06-15 ENCOUNTER — Telehealth: Payer: Self-pay | Admitting: *Deleted

## 2011-06-15 ENCOUNTER — Other Ambulatory Visit: Payer: Self-pay | Admitting: *Deleted

## 2011-06-15 NOTE — Telephone Encounter (Signed)
Pt informed and rest/stress myoview sent to terri for referral

## 2011-06-15 NOTE — Telephone Encounter (Signed)
Due to her age and risk factors  With new onset exertional chest pain she should have a myoview stress test and if the pain worsens or is associated with sweating or nausea she should go to the ER ASAP

## 2011-06-15 NOTE — Telephone Encounter (Signed)
Pt states she has had some chest tightness and achiness since last November.  Did see Dr. Lovell Sheehan and had an EKG, but was walking this am and had some pain and tightness.  Is beginning to worry her now, and is asking for advice, and to see Dr. Lovell Sheehan.

## 2011-07-05 ENCOUNTER — Telehealth: Payer: Self-pay | Admitting: *Deleted

## 2011-07-05 ENCOUNTER — Ambulatory Visit (HOSPITAL_COMMUNITY): Payer: Medicare Other | Attending: Internal Medicine | Admitting: Radiology

## 2011-07-05 DIAGNOSIS — R0602 Shortness of breath: Secondary | ICD-10-CM

## 2011-07-05 DIAGNOSIS — R079 Chest pain, unspecified: Secondary | ICD-10-CM | POA: Insufficient documentation

## 2011-07-05 MED ORDER — TECHNETIUM TC 99M TETROFOSMIN IV KIT
11.0000 | PACK | Freq: Once | INTRAVENOUS | Status: AC | PRN
  Administered 2011-07-05: 11 via INTRAVENOUS

## 2011-07-05 MED ORDER — TECHNETIUM TC 99M TETROFOSMIN IV KIT
33.0000 | PACK | Freq: Once | INTRAVENOUS | Status: AC | PRN
  Administered 2011-07-05: 33 via INTRAVENOUS

## 2011-07-05 NOTE — Telephone Encounter (Signed)
Pt is asking for a RX for poison ivy, please.

## 2011-07-05 NOTE — Progress Notes (Signed)
[facility] SITE 3 NUCLEAR MED [address] [place] [address] [phone]  Cardiology Nuclear Med Study  Julia Palmer is a 70 y.o. female [id number] [birth year]   Nuclear Med Background Indication for Stress Test:  Evaluation for Ischemia History:  No previous documented CAD Cardiac Risk Factors: none  Symptoms:  Chest Pain with Exertion (last date of chest discomfort 07/05/11), Chest Pressure.  (last date of chest discomfort 07/05/11), Diaphoresis, DOE, Nausea and SOB   Nuclear Pre-Procedure Caffeine/Decaff Intake:  None NPO After: 7:00pm   Lungs:  clear IV 0.9% NS with Angio Cath:  20g  IV Site: L Wrist x 1, tolerated well IV Started by:  Irean Hong, RN  Chest Size (in):  38 Cup Size: B  Height: 5\' 5"  (1.651 m)  Weight:  154 lb (69.854 kg)  BMI:  Body mass index is 25.63 kg/(m^2). Tech Comments:  n/a    Nuclear Med Study 1 or 2 day study: 1 day  Stress Test Type:  Stress  Reading MD: Willa Rough, MD  Order Authorizing Provider:  Darryll Capers, MD  Resting Radionuclide: Technetium 74m Tetrofosmin  Resting Radionuclide Dose: 10.9 mCi   Stress Radionuclide:  Technetium 45m Tetrofosmin  Stress Radionuclide Dose: 33 mCi           Stress Protocol Rest HR: 62 Stress HR: 133  Rest BP: 116 Stress BP: 153  Exercise Time (min): 6:48min METS: 7.70   Predicted Max HR: 152 bpm % Max HR: 87.5 bpm Rate Pressure Product: 21308   Dose of Adenosine (mg):  n/a Dose of Lexiscan: n/a mg  Dose of Atropine (mg): n/a Dose of Dobutamine: n/a mcg/kg/min (at max HR)  Stress Test Technologist: Frederick Peers, EMT-P  Nuclear Technologist:  Domenic Polite, CNMT     Rest Procedure:  Myocardial perfusion imaging was performed at rest 45 minutes following the intravenous administration of Technetium 76m Tetrofosmin. Rest ECG: NSR  Stress Procedure:  The patient exercised for 6:8mins.  The patient stopped due to sob and c/o chest pressure that subsided within 1-2  minutes in recovery.  There were non specific ST-T wave changes.  Technetium 9m Tetrofosmin was injected at peak exercise and myocardial perfusion imaging was performed after a brief delay. Stress ECG: No significant ST segment change suggestive of ischemia.  QPS Raw Data Images:  Normal; no motion artifact; normal heart/lung ratio. Stress Images:  Normal homogeneous uptake in all areas of the myocardium. Rest Images:  Normal homogeneous uptake in all areas of the myocardium. Subtraction (SDS):  No evidence of ischemia. Transient Ischemic Dilatation (Normal <1.22):  1.01 Lung/Heart Ratio (Normal <0.45):  .32  Quantitative Gated Spect Images QGS EDV:  90 ml QGS ESV:  30 ml QGS cine images:  Normal Wall Motion QGS EF: 63%  Impression Exercise Capacity:  Fair exercise capacity. BP Response:  Normal blood pressure response. Clinical Symptoms:  Patient had chest pressure. ECG Impression:  No significant ST segment change suggestive of ischemia. Comparison with Prior Nuclear Study: No previous nuclear study performed  Overall Impression:  Normal stress nuclear study.  Willa Rough

## 2011-07-06 ENCOUNTER — Telehealth: Payer: Self-pay | Admitting: *Deleted

## 2011-07-06 MED ORDER — PREDNISONE (PAK) 10 MG PO TABS: 10.0000 mg | ORAL_TABLET | ORAL | Status: DC

## 2011-07-06 NOTE — Telephone Encounter (Signed)
Notified pt. 

## 2011-07-06 NOTE — Telephone Encounter (Signed)
Pt informed and will see dr Lovell Sheehan at [phone] on tuesday

## 2011-07-06 NOTE — Telephone Encounter (Signed)
Prednisone dose pack 10 mg 12 day

## 2011-07-06 NOTE — Telephone Encounter (Signed)
[facility] called to let us know this pt already had a steroid dose pack from a different MD yesterday.  Cancelled our order for now.

## 2011-07-06 NOTE — Telephone Encounter (Signed)
Pt would like to now results of stress test please contact.

## 2011-07-10 ENCOUNTER — Encounter: Payer: Self-pay | Admitting: Internal Medicine

## 2011-07-10 ENCOUNTER — Ambulatory Visit (INDEPENDENT_AMBULATORY_CARE_PROVIDER_SITE_OTHER): Payer: Medicare Other | Admitting: Internal Medicine

## 2011-07-10 DIAGNOSIS — R079 Chest pain, unspecified: Secondary | ICD-10-CM

## 2011-07-10 DIAGNOSIS — L255 Unspecified contact dermatitis due to plants, except food: Secondary | ICD-10-CM

## 2011-07-10 DIAGNOSIS — L237 Allergic contact dermatitis due to plants, except food: Secondary | ICD-10-CM

## 2011-07-10 MED ORDER — METHYLPREDNISOLONE ACETATE 80 MG/ML IJ SUSP
120.0000 mg | Freq: Once | INTRAMUSCULAR | Status: AC
  Administered 2011-07-10: 120 mg via INTRAMUSCULAR

## 2011-07-10 NOTE — Progress Notes (Signed)
Addended by: Willy Eddy on: 07/10/2011 04:21 PM   Modules accepted: Orders

## 2011-07-10 NOTE — Progress Notes (Signed)
Subjective:    Patient ID: Julia Palmer, female    DOB: 04-11-1941, 70 y.o.   MRN: [id number]  HPI Patient is a 70 year old white female with no significant history of prior coronary heart disease although she does have family history significant for peripheral vascular disease and stroke in her father.  She has had a history of iron deficiency anemia secondary to blood loss at which time she presented with edema and extreme fatigue.  Past several weeks she has consistent exertional shortness of breath she states like it as a severe pressure on her mid chest she points to the mid sternal area it is reproducible with exercise on a bicycle with walking fast and with walking up stairs.  It is consistent in its pattern.  She had a Cardiolite last week which was negative both from an EKG and from a Myoview imaging.  But her pain is persistent she does not have any dark or tarry stools she does not have any excessive gastroesophageal reflux she does state that she has restless sleep and her husband observes that she tosses and turns but does not snore.  She has some numbness in her fingertips and have noticed that they sometimes turned blue with exercise and she has noticed some numbness and tingling in her feet.     Review of Systems  Constitutional: Negative for activity change, appetite change, fatigue and unexpected weight change.  HENT: Negative for ear pain, congestion, neck pain, postnasal drip and sinus pressure.   Eyes: Negative for redness and visual disturbance.  Respiratory: Positive for chest tightness and shortness of breath. Negative for cough, wheezing and stridor.   Gastrointestinal: Negative for abdominal pain and abdominal distention.  Genitourinary: Negative for dysuria, frequency and menstrual problem.  Musculoskeletal: Negative for myalgias, joint swelling and arthralgias.  Skin: Negative for rash and wound.  Neurological: Negative for dizziness, weakness and  headaches.  Hematological: Negative for adenopathy. Does not bruise/bleed easily.  Psychiatric/Behavioral: Negative for sleep disturbance and decreased concentration.       Past Medical History  Diagnosis Date  . Menopause    History reviewed. No pertinent past surgical history.  reports that she has never smoked. She does not have any smokeless tobacco history on file. She reports that she does not drink alcohol or use illicit drugs. family history includes Cancer in her sister; Dementia in her mother; and Stroke in her father. Allergies  Allergen Reactions  . Codeine     REACTION: Nausea, Vomitting    Objective:   Physical Exam  Nursing note and vitals reviewed. Constitutional: She is oriented to person, place, and time. She appears well-developed and well-nourished. No distress.  HENT:  Head: Normocephalic and atraumatic.  Right Ear: External ear normal.  Left Ear: External ear normal.  Nose: Nose normal.  Mouth/Throat: Oropharynx is clear and moist.  Eyes: Conjunctivae and EOM are normal. Pupils are equal, round, and reactive to light.  Neck: Normal range of motion. Neck supple. No JVD present. No tracheal deviation present. No thyromegaly present.  Cardiovascular: Normal rate, regular rhythm, normal heart sounds and intact distal pulses.   No murmur heard. Pulmonary/Chest: Effort normal and breath sounds normal. She has no wheezes. She exhibits no tenderness.  Abdominal: Soft. Bowel sounds are normal.  Musculoskeletal: Normal range of motion. She exhibits no edema and no tenderness.  Lymphadenopathy:    She has no cervical adenopathy.  Neurological: She is alert and oriented to person, place, and time. She has normal reflexes.  No cranial nerve deficit.  Skin: Skin is warm and dry. She is not diaphoretic.  Psychiatric: She has a normal mood and affect. Her behavior is normal.          Assessment & Plan:  I discussed the case with Dr. Graciela Husbands and we both believe that  with her pattern of exertional chest pain one is worried about ischemic heart disease.  Given her history of blood loss anemia even though she is not symptomatic with melena or dark stools there is also the possibility that severe anemia may be a precipitating cause of her exertional chest tightness with abnormal Cardiolite.  We will order a stat CBC differential today as well as a d-dimer and if the CBC differential showed a significant anemia we'll pursue replacement of blood prior to her cardiac catheterization if the d-dimer is positive we will consider a CT of the chest angiography prior to cardiac catheterization however if both of the CBC and the d-dimer are normal I would recommend a diagnostic catheterization for determination of the source of her chest pain.

## 2011-07-10 NOTE — Assessment & Plan Note (Signed)
Patient has reproducible chest pain with activity such as walking briskly going upstairs and riding a bicycle in a spin class.  She had a Cardiolite done which showed a normal blood pressure curve and a normal EKG and Cardiolite images however she did have chest pain reproducible at that time she continues to have persistent reproducible chest pain which is worrisome for a false negative Cardiolite.

## 2011-07-10 NOTE — Patient Instructions (Signed)
You have an appointment in the the AM

## 2011-07-11 ENCOUNTER — Ambulatory Visit (INDEPENDENT_AMBULATORY_CARE_PROVIDER_SITE_OTHER): Payer: Medicare Other | Admitting: Cardiovascular Disease

## 2011-07-11 ENCOUNTER — Encounter: Payer: Self-pay | Admitting: *Deleted

## 2011-07-11 ENCOUNTER — Encounter: Payer: Self-pay | Admitting: Cardiovascular Disease

## 2011-07-11 VITALS — BP 145/81 | HR 70 | Ht 66.0 in | Wt 153.1 lb

## 2011-07-11 DIAGNOSIS — R079 Chest pain, unspecified: Secondary | ICD-10-CM

## 2011-07-11 LAB — CBC WITH DIFFERENTIAL/PLATELET
Basophils Absolute: 0.1 10*3/uL (ref 0.0–0.1)
Lymphocytes Relative: 19.6 % (ref 12.0–46.0)
Monocytes Relative: 6.2 % (ref 3.0–12.0)
Platelets: 420 10*3/uL — ABNORMAL HIGH (ref 150.0–400.0)
RDW: 18 % — ABNORMAL HIGH (ref 11.5–14.6)
WBC: 9.7 10*3/uL (ref 4.5–10.5)

## 2011-07-11 LAB — BASIC METABOLIC PANEL
Chloride: 107 mEq/L (ref 96–112)
Potassium: 4.9 mEq/L (ref 3.5–5.1)
Sodium: 143 mEq/L (ref 135–145)

## 2011-07-11 LAB — D-DIMER, QUANTITATIVE: D-Dimer, Quant: 0.45 ug/mL-FEU (ref 0.00–0.48)

## 2011-07-11 MED ORDER — NITROGLYCERIN 0.4 MG SL SUBL: 0.4000 mg | SUBLINGUAL_TABLET | SUBLINGUAL | Status: DC | PRN

## 2011-07-11 MED ORDER — ASPIRIN EC 81 MG PO TBEC: 81.0000 mg | DELAYED_RELEASE_TABLET | Freq: Every day | ORAL | Status: AC

## 2011-07-11 NOTE — Progress Notes (Signed)
History of Present Illness:69 yo female with history of iron deficiency anemia with recent c/o dyspnea and chest tightness. She was seen in primary care by Dr. Lovell Sheehan. Stress myoview  07/05/11 with no evidence of ischemia by EKG or on stress nuclear images. She has continued to have lifestyle limiting dyspnea and chest tightness. The case was discussed between Dr Lovell Sheehan and Dr. Graciela Husbands yesterday. She is here today for further workup and discussion in regards to potential cardiac cath.   She has no previous cardiac history. She denies history of HTN, HLD or DM. She tells me that she has always been active. Over the last few months, she has noticed dyspnea with exercise, tightness in her chest. She is also dyspneic when walking up stairs. This is a clear change as she has been active her entire life. No dizziness, near syncope or syncope. She did get chest pain while exercising for the stress myoview last week. Her EKG showed upsloping ST depression.   Past Medical History  Diagnosis Date  . Menopause   . Iron deficiency anemia     Past Surgical History  Procedure Date  . Foot surgery     Current Outpatient Prescriptions  Medication Sig Dispense Refill  . Ascorbic Acid (VITAMIN C) 1000 MG tablet Take 1,000 mg by mouth daily.        . B Complex-C (SUPER B COMPLEX PO) Take by mouth daily.        . Biotin 1000 MCG tablet Take 1,000 mcg by mouth daily.        . Calcium Carbonate (CALTRATE 600 PO) Take by mouth daily.        . Cholecalciferol 1000 UNITS tablet Take 1,000 Units by mouth daily.        . clobetasol (TEMOVATE) 0.05 % cream Apply topically as needed.        . fish oil-omega-3 fatty acids 1000 MG capsule Take 2 g by mouth daily.        . Magnesium 200 MG TABS Take by mouth daily.        . Multiple Vitamins-Minerals (ICAPS MV PO) Take by mouth daily.        . Multiple Vitamins-Minerals (MULTIVITAMIN,TX-MINERALS) tablet Take 1 tablet by mouth daily.        Marland Kitchen POTASSIUM GLUCONATE PO Take by  mouth daily.        . predniSONE (STERAPRED UNI-PAK) 10 MG tablet       . triamterene-hydrochlorothiazide (MAXZIDE-25) 37.5-25 MG per tablet        Current Facility-Administered Medications  Medication Dose Route Frequency Provider Last Rate Last Dose  . methylPREDNISolone acetate (DEPO-MEDROL) injection 120 mg  120 mg Intramuscular Once Carrie Mew   120 mg at 07/10/11 1618    Allergies  Allergen Reactions  . Codeine     REACTION: Nausea, Vomitting    History   Social History  . Marital Status: Married    Spouse Name: N/A    Number of Children: 1  . Years of Education: N/A   Occupational History  . [employer]   Social History Main Topics  . Smoking status: Never Smoker   . Smokeless tobacco: Not on file  . Alcohol Use: No  . Drug Use: No  . Sexually Active: Yes   Other Topics Concern  . Not on file   Social History Narrative  . No narrative on file    Family History  Problem Relation Age of Onset  . Dementia  Mother   . Stroke Father   . Cancer Sister     breast    Review of Systems:  As stated in the HPI and otherwise negative.   BP 145/81  Pulse 70  Ht 5\' 6"  (1.676 m)  Wt 153 lb 1.9 oz (69.455 kg)  BMI 24.71 kg/m2  Physical Examination: General: Well developed, well nourished, NAD HEENT: OP clear, mucus membranes moist SKIN: warm, dry. No rashes. Neuro: No focal deficits Musculoskeletal: Muscle strength 5/5 all ext Psychiatric: Mood and affect normal Neck: No JVD, no carotid bruits, no thyromegaly, no lymphadenopathy. Lungs:Clear bilaterally, no wheezes, rhonci, crackles Cardiovascular: Regular rate and rhythm. No murmurs, gallops or rubs. Abdomen:Soft. Bowel sounds present. Non-tender.  Extremities: No lower extremity edema. Pulses are 2 + in the bilateral DP/PT.  EKG:NSR, rate 68 bpm.

## 2011-07-11 NOTE — Patient Instructions (Signed)
Your physician recommends that you schedule a follow-up appointment in: 3-4 weeks  Your physician has requested that you have a cardiac catheterization. Cardiac catheterization is used to diagnose and/or treat various heart conditions. Doctors may recommend this procedure for a number of different reasons. The most common reason is to evaluate chest pain. Chest pain can be a symptom of coronary artery disease (CAD), and cardiac catheterization can show whether plaque is narrowing or blocking your heart's arteries. This procedure is also used to evaluate the valves, as well as measure the blood flow and oxygen levels in different parts of your heart. For further information please visit [email or web address] Please follow instruction sheet, as given. Scheduled for July 18, 2011.

## 2011-07-11 NOTE — Progress Notes (Signed)
Addended by: Early Chars on: 07/11/2011 02:09 PM   Modules accepted: Orders

## 2011-07-11 NOTE — Assessment & Plan Note (Signed)
She has exertional chest pressure and dyspnea. Even though her stress test was negative, her clinical symptoms are worrisome. I think we need to exclude CAD. I have recommended a cardiac cath. She agrees to proceed. Risks and benefits reviewed with pt. Will plan for 07/18/11 in the outpt cath lab at [facility].

## 2011-07-18 ENCOUNTER — Inpatient Hospital Stay (HOSPITAL_BASED_OUTPATIENT_CLINIC_OR_DEPARTMENT_OTHER)
Admission: RE | Admit: 2011-07-18 | Discharge: 2011-07-18 | Disposition: A | Discharge status: Home / Self Care | Payer: Medicare Other | Source: Ambulatory Visit | Attending: Cardiovascular Disease | Admitting: Cardiovascular Disease

## 2011-07-18 ENCOUNTER — Encounter: Payer: Self-pay | Admitting: *Deleted

## 2011-07-18 DIAGNOSIS — R079 Chest pain, unspecified: Secondary | ICD-10-CM

## 2011-07-19 NOTE — Cardiovascular Report (Signed)
  NAMEEVOLETTE, PENDELL NO.:  [id number]  MEDICAL RECORD NO.:  [id number]  LOCATION:                                 FACILITY:  PHYSICIAN:  Verne Carrow, MDDATE OF BIRTH:  23-Jun-1941  DATE OF PROCEDURE:  07/18/2011 DATE OF DISCHARGE:                           CARDIAC CATHETERIZATION   PRIMARY CARE PHYSICIAN:  Stacie Glaze, MD  PROCEDURES PERFORMED: 1. Left heart catheterization. 2. Selective coronary angiography. 3. Left ventricular angiogram.  OPERATOR:  Verne Carrow, MD  INDICATIONS:  This is a 70 year old Caucasian female with a history of iron-deficiency anemia and vascular disease in her family who has had recent episodes of exertional chest pressure.  The patient had a negative stress Myoview, which did not suggest ischemia.  She continued to have chest pain with minimal exertion.  This chest pain resolved with rest.  The pain is substernal in location.  I saw the patient in the office and discussed cardiac catheterization to ultimately exclude coronary artery disease as the cause of her symptoms.  PROCEDURE IN DETAIL:  The patient was brought to the outpatient cardiac catheterization laboratory after signing informed consent for the procedure.  The right groin was prepped and draped in sterile fashion. Lidocaine 1% was used for local anesthesia.  A 4-French sheath was inserted into the right femoral artery without difficulty.  Standard diagnostic catheters were used to perform selective coronary angiography.  A pigtail catheter was used to perform a left ventricular angiogram.  The patient tolerated the procedure well.  There were no immediate complications.  The patient was taken to the recovery area in stable condition.  HEMODYNAMIC FINDINGS:  Central aortic pressure 133/72.  Left ventricular pressure 132/13 over 25.  ANGIOGRAPHIC FINDINGS: 1. The left main coronary artery bifurcated into the circumflex and     the left  anterior descending artery.  There was no evidence of     disease in this vessel. 2. Circumflex artery was a moderate-sized vessel that gave off several     small-caliber obtuse marginal branches.  There was no disease in     this system.  This is a nondominant vessel. 3. The left anterior descending artery was a large vessel that coursed     to the apex and gave off a moderate-sized diagonal branch.  There     was no disease in this system.  There was a large septal     perforating branch. 4. The right coronary artery is a large dominant vessel with no     evidence of disease. 5. Left ventricular angiogram was performed in the RAO projection and     showed normal left ventricular systolic function with ejection     fraction of 55%.  IMPRESSION: 1. No angiographic evidence of coronary artery disease. 2. Normal left ventricular systolic function.  RECOMMENDATIONS:  No further cardiac workup at this time.     Verne Carrow, MD     CM/MEDQ  D:  07/18/2011  T:  07/18/2011  Job:  [id number]  cc:   Stacie Glaze, MD  Electronically Signed by Verne Carrow MD on 07/19/2011 09:30:07 AM

## 2011-08-02 ENCOUNTER — Encounter: Payer: Self-pay | Admitting: Cardiovascular Disease

## 2011-08-03 ENCOUNTER — Encounter: Payer: Self-pay | Admitting: Cardiovascular Disease

## 2011-08-03 ENCOUNTER — Ambulatory Visit (INDEPENDENT_AMBULATORY_CARE_PROVIDER_SITE_OTHER): Payer: Medicare Other | Admitting: Cardiovascular Disease

## 2011-08-03 VITALS — BP 110/68 | HR 70 | Resp 18 | Ht 66.0 in | Wt 151.8 lb

## 2011-08-03 DIAGNOSIS — R079 Chest pain, unspecified: Secondary | ICD-10-CM

## 2011-08-03 NOTE — Patient Instructions (Signed)
Your physician recommends that you schedule a follow-up appointment as needed  

## 2011-08-03 NOTE — Progress Notes (Signed)
History of Present Illness:69 yo female with history of iron deficiency anemia who I saw as a new patient 07/11/11 for evaluation of dyspnea and chest tightness. She was seen in primary care by Dr. Lovell Sheehan. Stress myoview 07/05/11 with no evidence of ischemia by EKG or on stress nuclear images. She continued to have lifestyle limiting dyspnea and chest tightness. Based on her symptoms that were worrisome for unstable angina, I arranged a left heart cath. This was performed on 07/18/11. Her coronaries are normal.  She is here today for follow up. No new complaints. She has been feeling well. Occasional chest pressure.    Past Medical History  Diagnosis Date  . Menopause   . Iron deficiency anemia     Past Surgical History  Procedure Date  . Foot surgery     Current Outpatient Prescriptions  Medication Sig Dispense Refill  . Ascorbic Acid (VITAMIN C) 1000 MG tablet Take 1,000 mg by mouth daily.        Marland Kitchen aspirin EC 81 MG tablet Take 1 tablet (81 mg total) by mouth daily.  150 tablet  0  . B Complex-C (SUPER B COMPLEX PO) Take by mouth daily.        . Biotin 1000 MCG tablet Take 1,000 mcg by mouth daily.        . Calcium Carbonate (CALTRATE 600 PO) Take by mouth daily.        . fish oil-omega-3 fatty acids 1000 MG capsule Take 2 g by mouth daily.        . Magnesium 200 MG TABS Take by mouth daily.        . Multiple Vitamins-Minerals (ICAPS MV PO) Take by mouth daily.        . Multiple Vitamins-Minerals (MULTIVITAMIN,TX-MINERALS) tablet Take 1 tablet by mouth daily.        Marland Kitchen POTASSIUM GLUCONATE PO Take by mouth daily.        Marland Kitchen triamterene-hydrochlorothiazide (MAXZIDE-25) 37.5-25 MG per tablet       . nitroGLYCERIN (NITROSTAT) 0.4 MG SL tablet Place 1 tablet (0.4 mg total) under the tongue every 5 (five) minutes as needed for chest pain.  25 tablet  6    Allergies  Allergen Reactions  . Codeine     REACTION: Nausea, Vomitting    History   Social History  . Marital Status: Married   Spouse Name: N/A    Number of Children: 1  . Years of Education: N/A   Occupational History  . [employer]   Social History Main Topics  . Smoking status: Never Smoker   . Smokeless tobacco: Not on file  . Alcohol Use: No  . Drug Use: No  . Sexually Active: Yes   Other Topics Concern  . Not on file   Social History Narrative  . No narrative on file    Family History  Problem Relation Age of Onset  . Dementia Mother   . Stroke Father   . Cancer Sister     breast    Review of Systems:  As stated in the HPI and otherwise negative.   BP 110/68  Pulse 70  Resp 18  Ht 5\' 6"  (1.676 m)  Wt 151 lb 12.8 oz (68.856 kg)  BMI 24.50 kg/m2  Physical Examination: General: Well developed, well nourished, NAD HEENT: OP clear, mucus membranes moist SKIN: warm, dry. No rashes. Neuro: No focal deficits Musculoskeletal: Muscle strength 5/5 all ext Psychiatric: Mood and affect normal Neck: No JVD,  no carotid bruits, no thyromegaly, no lymphadenopathy. Lungs:Clear bilaterally, no wheezes, rhonci, crackles Cardiovascular: Regular rate and rhythm. No murmurs, gallops or rubs. Abdomen:Soft. Bowel sounds present. Non-tender.  Extremities: No lower extremity edema. Pulses are 2 + in the bilateral DP/PT.  Cardiac Cath 07/18/11:  1. The left main coronary artery bifurcated into the circumflex and       the left anterior descending artery.  There was no evidence of       disease in this vessel.   2. Circumflex artery was a moderate-sized vessel that gave off several       small-caliber obtuse marginal branches.  There was no disease in       this system.  This is a nondominant vessel.   3. The left anterior descending artery was a large vessel that coursed       to the apex and gave off a moderate-sized diagonal branch.  There       was no disease in this system.  There was a large septal       perforating branch.   4. The right coronary artery is a large dominant vessel  with no       evidence of disease.   5. Left ventricular angiogram was performed in the RAO projection and       showed normal left ventricular systolic function with ejection       fraction of 55%.

## 2011-08-03 NOTE — Assessment & Plan Note (Signed)
No evidence of CAD. Non-cardiac chest pain. No further workup.

## 2011-08-24 ENCOUNTER — Ambulatory Visit (INDEPENDENT_AMBULATORY_CARE_PROVIDER_SITE_OTHER): Payer: Medicare Other | Admitting: Internal Medicine

## 2011-08-24 ENCOUNTER — Encounter: Payer: Self-pay | Admitting: Internal Medicine

## 2011-08-24 DIAGNOSIS — Z79899 Other long term (current) drug therapy: Secondary | ICD-10-CM

## 2011-08-24 DIAGNOSIS — Z733 Stress, not elsewhere classified: Secondary | ICD-10-CM

## 2011-08-24 DIAGNOSIS — D649 Anemia, unspecified: Secondary | ICD-10-CM

## 2011-08-24 DIAGNOSIS — I7300 Raynaud's syndrome without gangrene: Secondary | ICD-10-CM

## 2011-08-24 DIAGNOSIS — R252 Cramp and spasm: Secondary | ICD-10-CM | POA: Insufficient documentation

## 2011-08-24 DIAGNOSIS — F439 Reaction to severe stress, unspecified: Secondary | ICD-10-CM

## 2011-08-24 DIAGNOSIS — Z0000 Encounter for general adult medical examination without abnormal findings: Secondary | ICD-10-CM

## 2011-08-24 LAB — CBC WITH DIFFERENTIAL/PLATELET
Basophils Absolute: 0.1 10*3/uL (ref 0.0–0.1)
Eosinophils Relative: 33.8 % — ABNORMAL HIGH (ref 0.0–5.0)
HCT: 33.9 % — ABNORMAL LOW (ref 36.0–46.0)
Hemoglobin: 10.8 g/dL — ABNORMAL LOW (ref 12.0–15.0)
Lymphs Abs: 1.8 10*3/uL (ref 0.7–4.0)
MCV: 82.3 fl (ref 78.0–100.0)
Monocytes Absolute: 0.5 10*3/uL (ref 0.1–1.0)
Neutro Abs: 3.6 10*3/uL (ref 1.4–7.7)
Platelets: 321 10*3/uL (ref 150.0–400.0)
RDW: 20.1 % — ABNORMAL HIGH (ref 11.5–14.6)

## 2011-08-24 LAB — HEPATIC FUNCTION PANEL
ALT: 40 U/L — ABNORMAL HIGH (ref 0–35)
Albumin: 2.9 g/dL — ABNORMAL LOW (ref 3.5–5.2)
Total Protein: 5.6 g/dL — ABNORMAL LOW (ref 6.0–8.3)

## 2011-08-24 LAB — POCT URINALYSIS DIPSTICK
Bilirubin, UA: NEGATIVE
Blood, UA: NEGATIVE
Glucose, UA: NEGATIVE
Nitrite, UA: NEGATIVE
Spec Grav, UA: >=1.03
pH, UA: 5.5

## 2011-08-24 LAB — CALCIUM: Calcium: 8.3 mg/dL — ABNORMAL LOW (ref 8.4–10.5)

## 2011-08-24 LAB — LIPID PANEL
HDL: 67.2 mg/dL (ref >39.00)
Triglycerides: 48 mg/dL (ref 0.0–149.0)

## 2011-08-24 LAB — MAGNESIUM: Magnesium: 2.1 mg/dL (ref 1.5–2.5)

## 2011-08-24 LAB — TSH: TSH: 2.36 u[IU]/mL (ref 0.35–5.50)

## 2011-08-24 LAB — BASIC METABOLIC PANEL
BUN: 18 mg/dL (ref 6–23)
CO2: 27 mEq/L (ref 19–32)
Calcium: 8.3 mg/dL — ABNORMAL LOW (ref 8.4–10.5)
Creatinine, Ser: 0.8 mg/dL (ref 0.4–1.2)
Glucose, Bld: 80 mg/dL (ref 70–99)

## 2011-08-24 MED ORDER — PRESERVISION AREDS 2 PO CAPS: 2.0000 | ORAL_CAPSULE | Freq: Every day | ORAL | Status: DC

## 2011-08-24 NOTE — Patient Instructions (Signed)
The patient is instructed to continue all medications as prescribed. Schedule followup with check out clerk upon leaving the clinic  

## 2011-08-27 ENCOUNTER — Other Ambulatory Visit (HOSPITAL_COMMUNITY): Payer: Self-pay | Admitting: Obstetrics & Gynecology

## 2011-08-27 DIAGNOSIS — Z1231 Encounter for screening mammogram for malignant neoplasm of breast: Secondary | ICD-10-CM

## 2011-08-27 NOTE — Progress Notes (Signed)
Subjective:    Patient ID: Julia Palmer, female    DOB: 05-22-1941, 70 y.o.   MRN: [id number]  HPI Patient presents for complete physical examination she has recently been followed for atypical chest pains general cardiac evaluation with no etiology.  Her husband is with her to discuss some of her symptomology including increased stress and recurrent leg cramps.  She said the cramps began in her lower extremity but now are more commonly in her thighs they're more common at the end of the day.  She also does have some Raynaud's symptomology in her hands bilaterally.  There is no history of peripheral vascular disease and had a rule out of cardiovascular disease recently.  She does have history of anemia   Review of Systems  Constitutional: Negative for activity change, appetite change and fatigue.  HENT: Negative for ear pain, congestion, neck pain, postnasal drip and sinus pressure.   Eyes: Negative for redness and visual disturbance.  Respiratory: Negative for cough, shortness of breath and wheezing.   Gastrointestinal: Negative for abdominal pain and abdominal distention.  Genitourinary: Negative for dysuria, frequency and menstrual problem.  Musculoskeletal: Positive for myalgias and arthralgias. Negative for joint swelling.       Cramping in thigh  Skin: Negative for rash and wound.  Neurological: Negative for dizziness, weakness and headaches.  Hematological: Negative for adenopathy. Does not bruise/bleed easily.  Psychiatric/Behavioral: Negative for sleep disturbance and decreased concentration.   Past Medical History  Diagnosis Date  . Menopause   . Iron deficiency anemia    Past Surgical History  Procedure Date  . Foot surgery     reports that she has never smoked. She does not have any smokeless tobacco history on file. She reports that she does not drink alcohol or use illicit drugs. family history includes Cancer in her sister; Dementia in her mother; and  Stroke in her father. Allergies  Allergen Reactions  . Codeine     REACTION: Nausea, Vomitting       Objective:   Physical Exam  Nursing note and vitals reviewed. Constitutional: She is oriented to person, place, and time. She appears well-developed and well-nourished. No distress.  HENT:  Head: Normocephalic and atraumatic.  Right Ear: External ear normal.  Left Ear: External ear normal.  Nose: Nose normal.  Mouth/Throat: Oropharynx is clear and moist.  Eyes: Conjunctivae and EOM are normal. Pupils are equal, round, and reactive to light.  Neck: Normal range of motion. Neck supple. No JVD present. No tracheal deviation present. No thyromegaly present.  Cardiovascular: Normal rate, regular rhythm, normal heart sounds and intact distal pulses.   No murmur heard. Pulmonary/Chest: Effort normal and breath sounds normal. She has no wheezes. She exhibits no tenderness.  Abdominal: Soft. Bowel sounds are normal.  Musculoskeletal: Normal range of motion. She exhibits edema. She exhibits no tenderness.  Lymphadenopathy:    She has no cervical adenopathy.  Neurological: She is alert and oriented to person, place, and time. She has normal reflexes. No cranial nerve deficit.  Skin: Skin is warm and dry. She is not diaphoretic.  Psychiatric: She has a normal mood and affect. Her behavior is normal.          Assessment & Plan:   This is a routine physical examination for this healthy  Female. Reviewed all health maintenance protocols including mammography colonoscopy bone density and reviewed appropriate screening labs. Her immunization history was reviewed as well as her current medications and allergies refills of her chronic  medications were given and the plan for yearly health maintenance was discussed all orders and referrals were made as appropriate.  ELH E. on the leg pain may be multifactorial we will check an iron CBC with differential calcium as well as potassium.  She does take  a diuretic on a "as-needed basis" but states that she has not been using it recently she designate to decreased fluid intake she has been unrestricted diet and exercise program.  She has mild peripheral edema history of anemia this could also contribute to her symptomology a CBC with differential will be measured today as well we discussed the continued use of an aspirin daily as well as reviewed her medications with her. We also discussed stress reduction.

## 2011-08-30 ENCOUNTER — Other Ambulatory Visit: Payer: Self-pay | Admitting: *Deleted

## 2011-09-03 ENCOUNTER — Ambulatory Visit
Admission: RE | Admit: 2011-09-03 | Discharge: 2011-09-03 | Disposition: A | Discharge status: Home / Self Care | Payer: Medicare Other | Source: Ambulatory Visit | Attending: Internal Medicine | Admitting: Internal Medicine

## 2011-09-13 ENCOUNTER — Telehealth: Payer: Self-pay

## 2011-09-13 MED ORDER — TRIAMTERENE-HCTZ 37.5-25 MG PO TABS: 1.0000 | ORAL_TABLET | Freq: Every day | ORAL | Status: DC

## 2011-09-13 NOTE — Telephone Encounter (Signed)
Patient is aware and refill sent to the pharmacy

## 2011-09-13 NOTE — Telephone Encounter (Signed)
Take 1/2 maxide every day

## 2011-09-13 NOTE — Telephone Encounter (Signed)
Pt would like the results of her ultrasound from 08/2011.  Pls advise.

## 2011-09-13 NOTE — Telephone Encounter (Signed)
Pt informed that u.s was ok except for polyp and you want that repeated in 6-12 months to check stability- ,but now pt is c/o ankles and feet being swollen every afternoon after taking off shoes- only takes maxzide every 3-4 days. Please advise. bp is ok per pt

## 2011-09-26 ENCOUNTER — Ambulatory Visit (HOSPITAL_COMMUNITY)
Admission: RE | Admit: 2011-09-26 | Discharge: 2011-09-26 | Disposition: A | Discharge status: Home / Self Care | Payer: Medicare Other | Source: Ambulatory Visit | Attending: Obstetrics & Gynecology | Admitting: Obstetrics & Gynecology

## 2011-09-26 DIAGNOSIS — Z13820 Encounter for screening for osteoporosis: Secondary | ICD-10-CM | POA: Insufficient documentation

## 2011-09-26 DIAGNOSIS — Z1231 Encounter for screening mammogram for malignant neoplasm of breast: Secondary | ICD-10-CM | POA: Insufficient documentation

## 2011-09-26 DIAGNOSIS — Z780 Asymptomatic menopausal state: Secondary | ICD-10-CM | POA: Insufficient documentation

## 2011-10-05 ENCOUNTER — Encounter: Payer: Self-pay | Admitting: Family Medicine

## 2011-10-05 ENCOUNTER — Ambulatory Visit (INDEPENDENT_AMBULATORY_CARE_PROVIDER_SITE_OTHER): Payer: Medicare Other | Admitting: Family Medicine

## 2011-10-05 VITALS — BP 120/70 | Temp 98.4°F | Wt 155.0 lb

## 2011-10-05 DIAGNOSIS — R609 Edema, unspecified: Secondary | ICD-10-CM

## 2011-10-05 DIAGNOSIS — R600 Localized edema: Secondary | ICD-10-CM

## 2011-10-05 MED ORDER — FUROSEMIDE 20 MG PO TABS: 20.0000 mg | ORAL_TABLET | Freq: Every day | ORAL | Status: DC

## 2011-10-05 NOTE — Patient Instructions (Signed)
Peripheral Edema You have swelling in your legs (peripheral edema). This swelling is due to excess accumulation of salt and water in your body. Edema may be a sign of heart, kidney or liver disease, or a side effect of a medication. It may also be due to problems in the leg veins. Elevating your legs and using special support stockings may be very helpful, if the cause of the swelling is due to poor venous circulation. Avoid long periods of standing, whatever the cause. Treatment of edema depends on identifying the cause. Chips, pretzels, pickles and other salty foods should be avoided. Restricting salt in your diet is almost always needed. Water pills (diuretics) are often used to remove the excess salt and water from your body via urine. These medicines prevent the kidney from reabsorbing sodium. This increases urine flow. Diuretic treatment may also result in lowering of potassium levels in your body. Potassium supplements may be needed if you have to use diuretics daily. Daily weights can help you keep track of your progress in clearing your edema. You should call your caregiver for follow up care as recommended. SEEK IMMEDIATE MEDICAL CARE IF:   You have increased swelling, pain, redness, or heat in your legs.   You develop shortness of breath, especially when lying down.   You develop chest or abdominal pain, weakness, or fainting.   You have a fever.  Document Released: 11/08/2004 Document Revised: 06/13/2011 Document Reviewed: 10/19/2009 [employer] Patient Information 2012 [employer]. 

## 2011-10-05 NOTE — Progress Notes (Signed)
Subjective:    Patient ID: Julia Palmer, female    DOB: August 23, 1941, 70 y.o.   MRN: [id number]  HPI 70year-old white female, nonsmoker, and 3 weeks lower extremities swelling. Dr. Lovell Sheehan started her on Maxzide 37.5-25 one half tablet daily. He has not helped her swelling. She's had complete lab work, urinalysis, EKG that were all within normal limits. She had a cardiac cath in October 2012 and has had a stable cardiac clearance from cardiologist. She denies any increase in sodium intake. Reports a similar incident happened in 200 and8, at resolve spontaneously. She has been using support hose with temporary relief.   Review of Systems  Constitutional: Negative.   HENT: Negative.   Respiratory: Negative.   Cardiovascular: Positive for leg swelling.       Bilateral lower extremity edema. 1+ pitting  Genitourinary: Negative.   Musculoskeletal: Negative.   Neurological: Negative.    Past Medical History  Diagnosis Date  . Menopause   . Iron deficiency anemia     History   Social History  . Marital Status: Married    Spouse Name: N/A    Number of Children: 1  . Years of Education: N/A   Occupational History  . [employer]   Social History Main Topics  . Smoking status: Never Smoker   . Smokeless tobacco: Not on file  . Alcohol Use: No  . Drug Use: No  . Sexually Active: Yes   Other Topics Concern  . Not on file   Social History Narrative  . No narrative on file    Past Surgical History  Procedure Date  . Foot surgery     Family History  Problem Relation Age of Onset  . Dementia Mother   . Stroke Father   . Cancer Sister     breast    Allergies  Allergen Reactions  . Codeine     REACTION: Nausea, Vomitting    Current Outpatient Prescriptions on File Prior to Visit  Medication Sig Dispense Refill  . Ascorbic Acid (VITAMIN C) 1000 MG tablet Take 1,000 mg by mouth daily.        Marland Kitchen aspirin EC 81 MG tablet Take 1 tablet (81 mg total) by  mouth daily.  150 tablet  0  . B Complex-C (SUPER B COMPLEX PO) Take by mouth daily.        . Biotin 1000 MCG tablet Take 1,000 mcg by mouth daily.        . Calcium Carbonate (CALTRATE 600 PO) Take by mouth daily.        . fish oil-omega-3 fatty acids 1000 MG capsule Take 2 g by mouth daily.        . Magnesium 200 MG TABS Take by mouth daily.        . Multiple Vitamins-Minerals (PRESERVISION AREDS 2) CAPS Take 2 capsules by mouth daily.      . nitroGLYCERIN (NITROSTAT) 0.4 MG SL tablet Place 1 tablet (0.4 mg total) under the tongue every 5 (five) minutes as needed for chest pain.  25 tablet  6  . POTASSIUM GLUCONATE PO Take by mouth daily.        Marland Kitchen triamterene-hydrochlorothiazide (MAXZIDE-25) 37.5-25 MG per tablet Take 1 each (1 tablet total) by mouth daily.  90 tablet  0    BP 120/70  Temp(Src) 98.4 F (36.9 C) (Oral)  Wt 155 lb (70.308 kg)chart   Objective:   Physical Exam  Constitutional: She is oriented to person, place, and  time. She appears well-developed and well-nourished.  Cardiovascular: Normal rate and regular rhythm.   Pulmonary/Chest: Effort normal and breath sounds normal.  Abdominal: Soft. Bowel sounds are normal.  Neurological: She is alert and oriented to person, place, and time.  Skin: Skin is dry.  Psychiatric: She has a normal mood and affect.          Assessment & Plan:    Assessment: Peripheral edema  Plan: From a cardiac and renal standpoint the patient is stable. I have ordered a lower extremity Doppler study of the legs bilaterally to assess vascularity appearing will consider referral to vascular indicated at that time. Temporary supply of Lasix 20 mg one by mouth daily x5 days given, only as a trial.

## 2011-10-08 NOTE — Progress Notes (Signed)
Addended by: Beverely Low on: 10/08/2011 09:26 AM   Modules accepted: Orders

## 2011-10-11 NOTE — Progress Notes (Signed)
Addended by: Beverely Low on: 10/11/2011 04:28 PM   Modules accepted: Orders

## 2011-10-12 ENCOUNTER — Encounter (INDEPENDENT_AMBULATORY_CARE_PROVIDER_SITE_OTHER): Payer: Medicare Other | Admitting: *Deleted

## 2011-10-12 ENCOUNTER — Encounter: Payer: Medicare Other | Admitting: *Deleted

## 2011-10-12 DIAGNOSIS — R609 Edema, unspecified: Secondary | ICD-10-CM

## 2011-10-12 DIAGNOSIS — R600 Localized edema: Secondary | ICD-10-CM

## 2011-10-17 ENCOUNTER — Telehealth: Payer: Self-pay

## 2011-10-17 NOTE — Telephone Encounter (Signed)
Pt aware.

## 2011-10-17 NOTE — Telephone Encounter (Signed)
Message copied by Beverely Low on Wed Oct 17, 2011  4:34 PM ------      Message from: Adline Mango B      Created: Wed Oct 17, 2011 11:33 AM       Lower extremity doppler normal. If symptoms persist, she will need a referral to vascular.

## 2011-10-24 ENCOUNTER — Encounter: Payer: Medicare Other | Admitting: Cardiology

## 2011-11-23 ENCOUNTER — Ambulatory Visit (INDEPENDENT_AMBULATORY_CARE_PROVIDER_SITE_OTHER): Payer: Medicare Other | Admitting: Internal Medicine

## 2011-11-23 ENCOUNTER — Encounter: Payer: Self-pay | Admitting: Internal Medicine

## 2011-11-23 VITALS — BP 124/74 | HR 76 | Temp 98.3°F | Resp 16 | Ht 66.0 in | Wt 162.0 lb

## 2011-11-23 DIAGNOSIS — I83893 Varicose veins of bilateral lower extremities with other complications: Secondary | ICD-10-CM

## 2011-11-23 DIAGNOSIS — M79609 Pain in unspecified limb: Secondary | ICD-10-CM

## 2011-11-23 DIAGNOSIS — I83813 Varicose veins of bilateral lower extremities with pain: Secondary | ICD-10-CM

## 2011-11-23 DIAGNOSIS — I8390 Asymptomatic varicose veins of unspecified lower extremity: Secondary | ICD-10-CM

## 2011-11-23 DIAGNOSIS — R609 Edema, unspecified: Secondary | ICD-10-CM

## 2011-11-23 LAB — BASIC METABOLIC PANEL
BUN: 19 mg/dL (ref 6–23)
Creatinine, Ser: 0.6 mg/dL (ref 0.4–1.2)
GFR: 97.72 mL/min (ref >60.00)
Potassium: 3.9 mEq/L (ref 3.5–5.1)

## 2011-11-23 MED ORDER — FUROSEMIDE 40 MG PO TABS: 40.0000 mg | ORAL_TABLET | Freq: Every day | ORAL | Status: DC

## 2011-11-23 MED ORDER — POTASSIUM CHLORIDE ER 8 MEQ PO TBCR: 8.0000 meq | EXTENDED_RELEASE_TABLET | Freq: Two times a day (BID) | ORAL | Status: DC

## 2011-11-23 NOTE — Patient Instructions (Addendum)
The patient is instructed to continue all medications as prescribed. Schedule followup with check out clerk upon leaving the clinic  

## 2011-11-23 NOTE — Progress Notes (Signed)
Subjective:    Patient ID: Julia Palmer, female    DOB: 1941-07-17, 71 y.o.   MRN: [id number]  HPI  Patient is a 71 year old female who is physically active she does a spin class on a regular basis and has worked very hard to keep her weight in control who presents with increased lower extremity edema. 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 The swelling is painful causes her calf is Shannon's and feet to experience pain and causes her feet to be tight shoes.  No history of evaluation for arterial disease or venous obstructions and those studies were normal she does have a +2 pitting edema of her lower extremities to the mid calf  Review of Systems  Constitutional: Negative for activity change, appetite change and fatigue.  HENT: Negative for ear pain, congestion, neck pain, postnasal drip and sinus pressure.   Eyes: Negative for redness and visual disturbance.  Respiratory: Negative for cough, shortness of breath and wheezing.   Cardiovascular: Positive for leg swelling.  Gastrointestinal: Negative for abdominal pain and abdominal distention.  Genitourinary: Negative for dysuria, frequency and menstrual problem.  Musculoskeletal: Negative for myalgias, joint swelling and arthralgias.  Skin: Negative for rash and wound.  Neurological: Negative for dizziness, weakness and headaches.  Hematological: Negative for adenopathy. Does not bruise/bleed easily.  Psychiatric/Behavioral: Negative for sleep disturbance and decreased concentration.   Past Medical History  Diagnosis Date  . Menopause   . Iron deficiency anemia     History   Social History  . Marital Status: Married    Spouse Name:  N/A    Number of Children: 1  . Years of Education: N/A   Occupational History  . [employer]   Social History Main Topics  . Smoking status: Never Smoker   . Smokeless tobacco: Not on file  . Alcohol Use: No  . Drug Use: No  . Sexually Active: Yes   Other Topics Concern  . Not on file   Social History Narrative  . No narrative on file    Past Surgical History  Procedure Date  . Foot surgery     Family History  Problem Relation Age of Onset  . Dementia Mother   . Stroke Father   . Cancer Sister     breast    Allergies  Allergen Reactions  . Codeine     REACTION: Nausea, Vomitting    Current Outpatient Prescriptions on File Prior to Visit  Medication Sig Dispense Refill  . Ascorbic Acid (VITAMIN C) 1000 MG tablet Take 1,000 mg by mouth daily.        Marland Kitchen aspirin EC 81 MG tablet Take 1 tablet (81 mg total) by mouth daily.  150 tablet  0  . B Complex-C (SUPER B COMPLEX PO) Take by mouth daily.        . Biotin 1000 MCG tablet Take 1,000 mcg by mouth daily.        . Calcium Carbonate (CALTRATE 600 PO) Take by mouth daily.        . fish oil-omega-3 fatty acids 1000 MG capsule Take 2 g by mouth daily.        Marland Kitchen  Magnesium 200 MG TABS Take by mouth daily.        . Multiple Vitamins-Minerals (PRESERVISION AREDS 2) CAPS Take 2 capsules by mouth daily.      . nitroGLYCERIN (NITROSTAT) 0.4 MG SL tablet Place 1 tablet (0.4 mg total) under the tongue every 5 (five) minutes as needed for chest pain.  25 tablet  6    BP 124/74  Pulse 76  Temp 98.3 F (36.8 C)  Resp 16  Ht 5\' 6"  (1.676 m)  Wt 162 lb (73.483 kg)  BMI 26.15 kg/m2       Objective:   Physical Exam  Constitutional: She is oriented to person, place, and time. She appears well-developed and well-nourished. No distress.  HENT:  Head: Normocephalic and atraumatic.  Right Ear: External ear normal.  Left Ear: External ear normal.  Nose: Nose normal.  Mouth/Throat: Oropharynx is clear and moist.    Eyes: Conjunctivae and EOM are normal. Pupils are equal, round, and reactive to light.  Neck: Normal range of motion. Neck supple. No JVD present. No tracheal deviation present. No thyromegaly present.  Cardiovascular: Normal rate, regular rhythm, normal heart sounds and intact distal pulses.   No murmur heard. Pulmonary/Chest: Effort normal and breath sounds normal. She has no wheezes. She exhibits no tenderness.  Abdominal: Soft. Bowel sounds are normal.  Musculoskeletal: She exhibits edema and tenderness.  Lymphadenopathy:    She has no cervical adenopathy.  Neurological: She is alert and oriented to person, place, and time. She has normal reflexes. No cranial nerve deficit.  Skin: Skin is warm and dry. She is not diaphoretic.  Psychiatric: She has a normal mood and affect. Her behavior is normal.          Assessment & Plan:  Probable dependent edema but will rule out nephrotic syndrome since there was slight protein on dip urine with 24-hour urine for protein as well as repeat the basic metabolic panel.  She was probably has incompetent veins and venous stasis we discussed the use of Jobst stockings for compression and will prescribe thigh-high Jobst stockings because of her particular anatomy.  We also will change her from Maxzide to Lasix 40 mg by mouth daily and potassium 8 mEq twice daily.  We'll followup with a PNET and reexam in 3 months after she has been wearing her Jobst stockings. If there is significant improvement in pain and swelling she would be a candidate for evaluation by a vein clinic

## 2011-11-27 LAB — PROTEIN, URINE, 24 HOUR
Protein, 24H Urine: 75 mg/d (ref 50–100)
Protein, Urine: 3 mg/dL

## 2011-11-29 NOTE — Progress Notes (Signed)
Pt informed and insturcted to wear compression hose daily

## 2011-12-21 ENCOUNTER — Ambulatory Visit: Payer: Medicare Other | Admitting: Internal Medicine

## 2012-01-23 ENCOUNTER — Encounter: Payer: Self-pay | Admitting: Internal Medicine

## 2012-01-23 ENCOUNTER — Ambulatory Visit (INDEPENDENT_AMBULATORY_CARE_PROVIDER_SITE_OTHER): Payer: Medicare Other | Admitting: Internal Medicine

## 2012-01-23 VITALS — BP 130/80 | HR 72 | Temp 98.2°F | Resp 16 | Ht 66.0 in | Wt 153.0 lb

## 2012-01-23 DIAGNOSIS — I10 Essential (primary) hypertension: Secondary | ICD-10-CM

## 2012-01-23 DIAGNOSIS — T887XXA Unspecified adverse effect of drug or medicament, initial encounter: Secondary | ICD-10-CM

## 2012-01-23 DIAGNOSIS — E785 Hyperlipidemia, unspecified: Secondary | ICD-10-CM

## 2012-01-23 DIAGNOSIS — D649 Anemia, unspecified: Secondary | ICD-10-CM

## 2012-01-23 LAB — BASIC METABOLIC PANEL
BUN: 24 mg/dL — ABNORMAL HIGH (ref 6–23)
Calcium: 8.2 mg/dL — ABNORMAL LOW (ref 8.4–10.5)
Creatinine, Ser: 0.7 mg/dL (ref 0.4–1.2)
GFR: 92.64 mL/min (ref >60.00)
Glucose, Bld: 68 mg/dL — ABNORMAL LOW (ref 70–99)

## 2012-01-23 LAB — CBC WITH DIFFERENTIAL/PLATELET
Basophils Relative: 0.9 % (ref 0.0–3.0)
Eosinophils Relative: 20.1 % — ABNORMAL HIGH (ref 0.0–5.0)
Monocytes Relative: 7.2 % (ref 3.0–12.0)
Neutrophils Relative %: 43.8 % (ref 43.0–77.0)
Platelets: 310 10*3/uL (ref 150.0–400.0)
RBC: 3.75 Mil/uL — ABNORMAL LOW (ref 3.87–5.11)
WBC: 6.5 10*3/uL (ref 4.5–10.5)

## 2012-01-23 LAB — LIPID PANEL
Cholesterol: 187 mg/dL (ref 0–200)
HDL: 78.4 mg/dL (ref >39.00)
LDL Cholesterol: 104 mg/dL — ABNORMAL HIGH (ref 0–99)
Triglycerides: 24 mg/dL (ref 0.0–149.0)
VLDL: 4.8 mg/dL (ref 0.0–40.0)

## 2012-01-23 LAB — HEPATIC FUNCTION PANEL
ALT: 28 U/L (ref 0–35)
AST: 39 U/L — ABNORMAL HIGH (ref 0–37)
Alkaline Phosphatase: 36 U/L — ABNORMAL LOW (ref 39–117)
Bilirubin, Direct: 0.1 mg/dL (ref 0.0–0.3)
Total Bilirubin: 0.5 mg/dL (ref 0.3–1.2)

## 2012-01-23 NOTE — Progress Notes (Signed)
Subjective:    Patient ID: Julia Palmer, female    DOB: June 05, 1941, 71 y.o.   MRN: [id number]  HPI  The pt has been on potassium and a fluid pill Has been wearing the compression hose Monitoring for HTN and PAD Has a hammertoe on the rigHT fot  Review of Systems  Constitutional: Negative for activity change, appetite change and fatigue.  HENT: Negative for ear pain, congestion, neck pain, postnasal drip and sinus pressure.   Eyes: Negative for redness and visual disturbance.  Respiratory: Negative for cough, shortness of breath and wheezing.   Gastrointestinal: Negative for abdominal pain and abdominal distention.  Genitourinary: Negative for dysuria, frequency and menstrual problem.  Musculoskeletal: Negative for myalgias, joint swelling and arthralgias.  Skin: Negative for rash and wound.  Neurological: Negative for dizziness, weakness and headaches.  Hematological: Negative for adenopathy. Does not bruise/bleed easily.  Psychiatric/Behavioral: Negative for sleep disturbance and decreased concentration.   Past Medical History  Diagnosis Date  . Menopause   . Iron deficiency anemia     History   Social History  . Marital Status: Married    Spouse Name: N/A    Number of Children: 1  . Years of Education: N/A   Occupational History  . [employer]   Social History Main Topics  . Smoking status: Never Smoker   . Smokeless tobacco: Not on file  . Alcohol Use: No  . Drug Use: No  . Sexually Active: Yes   Other Topics Concern  . Not on file   Social History Narrative  . No narrative on file    Past Surgical History  Procedure Date  . Foot surgery     Family History  Problem Relation Age of Onset  . Dementia Mother   . Stroke Father   . Cancer Sister     breast    Allergies  Allergen Reactions  . Codeine     REACTION: Nausea, Vomitting    Current Outpatient Prescriptions on File Prior to Visit  Medication Sig Dispense Refill  .  Ascorbic Acid (VITAMIN C) 1000 MG tablet Take 1,000 mg by mouth daily.        Marland Kitchen aspirin EC 81 MG tablet Take 1 tablet (81 mg total) by mouth daily.  150 tablet  0  . B Complex-C (SUPER B COMPLEX PO) Take by mouth daily.        . Biotin 1000 MCG tablet Take 1,000 mcg by mouth daily.        . Calcium Carbonate (CALTRATE 600 PO) Take by mouth daily.        . fish oil-omega-3 fatty acids 1000 MG capsule Take 2 g by mouth daily.        . furosemide (LASIX) 40 MG tablet Take 1 tablet (40 mg total) by mouth daily.  30 tablet  3  . Magnesium 200 MG TABS Take by mouth daily.        . Multiple Vitamins-Minerals (PRESERVISION AREDS 2) CAPS Take 2 capsules by mouth daily.      . nitroGLYCERIN (NITROSTAT) 0.4 MG SL tablet Place 1 tablet (0.4 mg total) under the tongue every 5 (five) minutes as needed for chest pain.  25 tablet  6  . potassium chloride (KLOR-CON) 8 MEQ tablet Take 1 tablet (8 mEq total) by mouth 2 (two) times daily.  60 tablet  11    BP 130/80  Pulse 72  Temp 98.2 F (36.8 C)  Resp 16  Ht  5\' 6"  (1.676 m)  Wt 153 lb (69.4 kg)  BMI 24.69 kg/m2       Objective:   Physical Exam  Nursing note and vitals reviewed. Constitutional: She is oriented to person, place, and time. She appears well-developed and well-nourished. No distress.  HENT:  Head: Normocephalic and atraumatic.  Right Ear: External ear normal.  Left Ear: External ear normal.  Nose: Nose normal.  Mouth/Throat: Oropharynx is clear and moist.  Eyes: Conjunctivae and EOM are normal. Pupils are equal, round, and reactive to light.  Neck: Normal range of motion. Neck supple. No JVD present. No tracheal deviation present. No thyromegaly present.  Cardiovascular: Normal rate, regular rhythm, normal heart sounds and intact distal pulses.   No murmur heard. Pulmonary/Chest: Effort normal and breath sounds normal. She has no wheezes. She exhibits no tenderness.  Abdominal: Soft. Bowel sounds are normal.  Musculoskeletal:  Normal range of motion. She exhibits no edema and no tenderness.  Lymphadenopathy:    She has no cervical adenopathy.  Neurological: She is alert and oriented to person, place, and time. She has normal reflexes. No cranial nerve deficit.  Skin: Skin is warm and dry. She is not diaphoretic.  Psychiatric: She has a normal mood and affect. Her behavior is normal.          Assessment & Plan:  The patient is a 71 year old female with hypertension ask her insufficiency to lower extremities particularly venous insufficiency with chronic venous edema.  She's been wearing the support hose with good success marked improvement in the edema and leg pain.  Weight is stable blood pressure is stable her current medication she's tolerating the diuretic today we will monitor her for a history of anemia with a CBC differential will monitor potassium and renal function with a complete blood count she is also do screening lipid and liver panel

## 2012-01-23 NOTE — Patient Instructions (Signed)
The patient is instructed to continue all medications as prescribed. Schedule followup with check out clerk upon leaving the clinic  

## 2012-01-25 ENCOUNTER — Other Ambulatory Visit: Payer: Medicare Other

## 2012-01-25 NOTE — Progress Notes (Signed)
Pt informed and will come in this pm for lab

## 2012-01-28 LAB — IRON: Iron: 37 ug/dL — ABNORMAL LOW (ref 42–145)

## 2012-01-29 ENCOUNTER — Other Ambulatory Visit: Payer: Self-pay | Admitting: *Deleted

## 2012-01-29 MED ORDER — IRON POLYSACCH CMPLX-B12-FA 150-0.025-1 MG PO CAPS: 150.0000 mg | ORAL_CAPSULE | Freq: Every day | ORAL | Status: DC

## 2012-01-29 NOTE — Progress Notes (Signed)
Pt informed and med sent to pharmacy 

## 2012-02-04 ENCOUNTER — Telehealth: Payer: Self-pay | Admitting: *Deleted

## 2012-02-04 MED ORDER — AZITHROMYCIN 250 MG PO TABS: ORAL_TABLET | ORAL | Status: AC

## 2012-02-04 NOTE — Telephone Encounter (Signed)
z pack per dr jenkins 

## 2012-02-04 NOTE — Telephone Encounter (Signed)
Pt is complaining of sinus infection, congestion, and green drainage x 1-2 weeks.  Would like RX for sinus infections.  She has been using OTC meds.

## 2012-02-04 NOTE — Telephone Encounter (Signed)
Notified pt. 

## 2012-03-23 ENCOUNTER — Other Ambulatory Visit: Payer: Self-pay | Admitting: Internal Medicine

## 2012-07-22 ENCOUNTER — Other Ambulatory Visit: Payer: Self-pay | Admitting: Internal Medicine

## 2012-08-26 ENCOUNTER — Ambulatory Visit (INDEPENDENT_AMBULATORY_CARE_PROVIDER_SITE_OTHER): Payer: Medicare Other | Admitting: Internal Medicine

## 2012-08-26 ENCOUNTER — Encounter: Payer: Self-pay | Admitting: Internal Medicine

## 2012-08-26 VITALS — BP 110/70 | HR 68 | Temp 98.3°F | Resp 14 | Ht 66.0 in | Wt 156.0 lb

## 2012-08-26 DIAGNOSIS — R252 Cramp and spasm: Secondary | ICD-10-CM

## 2012-08-26 DIAGNOSIS — Z0000 Encounter for general adult medical examination without abnormal findings: Secondary | ICD-10-CM

## 2012-08-26 DIAGNOSIS — Z79899 Other long term (current) drug therapy: Secondary | ICD-10-CM

## 2012-08-26 DIAGNOSIS — R609 Edema, unspecified: Secondary | ICD-10-CM

## 2012-08-26 DIAGNOSIS — R600 Localized edema: Secondary | ICD-10-CM

## 2012-08-26 DIAGNOSIS — D500 Iron deficiency anemia secondary to blood loss (chronic): Secondary | ICD-10-CM

## 2012-08-26 LAB — IRON: Iron: 23 ug/dL — ABNORMAL LOW (ref 42–145)

## 2012-08-26 LAB — POCT URINALYSIS DIPSTICK
Protein, UA: NEGATIVE
Spec Grav, UA: 1.025
Urobilinogen, UA: 0.2
pH, UA: 5.5

## 2012-08-26 LAB — LIPID PANEL
Cholesterol: 177 mg/dL (ref 0–200)
HDL: 66.5 mg/dL (ref >39.00)
LDL Cholesterol: 101 mg/dL — ABNORMAL HIGH (ref 0–99)
Triglycerides: 49 mg/dL (ref 0.0–149.0)

## 2012-08-26 LAB — CBC WITH DIFFERENTIAL/PLATELET
Basophils Absolute: 0.1 10*3/uL (ref 0.0–0.1)
Eosinophils Absolute: 1.5 10*3/uL — ABNORMAL HIGH (ref 0.0–0.7)
HCT: 30.7 % — ABNORMAL LOW (ref 36.0–46.0)
Lymphs Abs: 1.6 10*3/uL (ref 0.7–4.0)
MCV: 78.3 fl (ref 78.0–100.0)
Monocytes Absolute: 0.4 10*3/uL (ref 0.1–1.0)
Neutrophils Relative %: 40.3 % — ABNORMAL LOW (ref 43.0–77.0)
Platelets: 381 10*3/uL (ref 150.0–400.0)
RDW: 16.9 % — ABNORMAL HIGH (ref 11.5–14.6)

## 2012-08-26 LAB — BASIC METABOLIC PANEL
BUN: 21 mg/dL (ref 6–23)
Calcium: 8.4 mg/dL (ref 8.4–10.5)
Chloride: 108 mEq/L (ref 96–112)
Creatinine, Ser: 0.7 mg/dL (ref 0.4–1.2)
GFR: 92.49 mL/min (ref >60.00)

## 2012-08-26 LAB — HEPATIC FUNCTION PANEL
ALT: 36 U/L — ABNORMAL HIGH (ref 0–35)
Total Bilirubin: 0.6 mg/dL (ref 0.3–1.2)

## 2012-08-26 LAB — TSH: TSH: 2.75 u[IU]/mL (ref 0.35–5.50)

## 2012-08-26 NOTE — Patient Instructions (Addendum)
Dr Raynald Kemp in [place] "practical paleo"

## 2012-08-26 NOTE — Progress Notes (Signed)
Subjective:    Patient ID: Julia Palmer, female    DOB: Apr 19, 1941, 71 y.o.   MRN: [id number]  HPI Patient is a 71 year old female with a history of atypical chest pain a normal Cardiolite in 2012 cardiovascular workup.  She presents with some persistent intermittent chest discomfort moderate edema of the lower extremity history of iron deficiency anemia history of leg cramps.  She takes a big amount of supplements cpx  Review of Systems  Constitutional: Negative for activity change, appetite change and fatigue.  HENT: Negative for ear pain, congestion, neck pain, postnasal drip and sinus pressure.   Eyes: Negative for redness and visual disturbance.  Respiratory: Positive for chest tightness. Negative for cough, shortness of breath and wheezing.   Cardiovascular: Positive for chest pain and leg swelling.  Gastrointestinal: Negative for abdominal pain and abdominal distention.  Genitourinary: Negative for dysuria, frequency and menstrual problem.  Musculoskeletal: Negative for myalgias, joint swelling and arthralgias.  Skin: Negative for rash and wound.  Neurological: Negative for dizziness, weakness and headaches.  Hematological: Negative for adenopathy. Does not bruise/bleed easily.  Psychiatric/Behavioral: Negative for sleep disturbance and decreased concentration.   Past Medical History  Diagnosis Date  . Menopause   . Iron deficiency anemia     History   Social History  . Marital Status: Married    Spouse Name: N/A    Number of Children: 1  . Years of Education: N/A   Occupational History  . [employer]   Social History Main Topics  . Smoking status: Never Smoker   . Smokeless tobacco: Not on file  . Alcohol Use: No  . Drug Use: No  . Sexually Active: Yes   Other Topics Concern  . Not on file   Social History Narrative  . No narrative on file    Past Surgical History  Procedure Date  . Foot surgery     Family History  Problem  Relation Age of Onset  . Dementia Mother   . Stroke Father   . Cancer Sister     breast    Allergies  Allergen Reactions  . Codeine     REACTION: Nausea, Vomitting    Current Outpatient Prescriptions on File Prior to Visit  Medication Sig Dispense Refill  . Ascorbic Acid (VITAMIN C) 1000 MG tablet Take 1,000 mg by mouth daily.        . B Complex-C (SUPER B COMPLEX PO) Take by mouth daily.        . Biotin 1000 MCG tablet Take 1,000 mcg by mouth daily.        . Calcium Carbonate (CALTRATE 600 PO) Take 500 mg by mouth daily.       . fish oil-omega-3 fatty acids 1000 MG capsule Take 2 g by mouth daily.        . furosemide (LASIX) 40 MG tablet take 1 tablet by mouth once daily  90 tablet  3  . Magnesium 200 MG TABS Take by mouth daily.        . Multiple Vitamins-Minerals (PRESERVISION AREDS 2) CAPS Take 2 capsules by mouth daily.      . potassium chloride (KLOR-CON) 8 MEQ tablet Take 1 tablet (8 mEq total) by mouth 2 (two) times daily.  60 tablet  11  . [DISCONTINUED] nitroGLYCERIN (NITROSTAT) 0.4 MG SL tablet Place 1 tablet (0.4 mg total) under the tongue every 5 (five) minutes as needed for chest pain.  25 tablet  6  BP 110/70  Pulse 68  Temp 98.3 F (36.8 C)  Resp 14  Ht 5\' 6"  (1.676 m)  Wt 156 lb (70.761 kg)  BMI 25.18 kg/m2       Objective:   Physical Exam  Nursing note and vitals reviewed. Constitutional: She is oriented to person, place, and time. She appears well-developed and well-nourished. No distress.  HENT:  Head: Normocephalic and atraumatic.  Right Ear: External ear normal.  Left Ear: External ear normal.  Nose: Nose normal.  Mouth/Throat: Oropharynx is clear and moist.  Eyes: Conjunctivae normal and EOM are normal. Pupils are equal, round, and reactive to light.  Neck: Normal range of motion. Neck supple. No JVD present. No tracheal deviation present. No thyromegaly present.  Cardiovascular: Normal rate, regular rhythm, normal heart sounds and intact  distal pulses.   No murmur heard. Pulmonary/Chest: Effort normal and breath sounds normal. She has no wheezes. She exhibits no tenderness.  Abdominal: Soft. Bowel sounds are normal.  Musculoskeletal: Normal range of motion. She exhibits edema and tenderness.  Lymphadenopathy:    She has no cervical adenopathy.  Neurological: She is alert and oriented to person, place, and time. She has normal reflexes. No cranial nerve deficit.  Skin: Skin is warm and dry. She is not diaphoretic.  Psychiatric: She has a normal mood and affect. Her behavior is normal.          Assessment & Plan:   This is a routine physical examination for this healthy  Female. Reviewed all health maintenance protocols including mammography colonoscopy bone density and reviewed appropriate screening labs. Her immunization history was reviewed as well as her current medications and allergies refills of her chronic medications were given and the plan for yearly health maintenance was discussed all orders and referrals were made as appropriate.   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 She is approved for surgery. Patient has history of iron deficiency anemia we'll monitor her CBC patient has a history of leg cramps on potassium we'll monitor potassium and adjust her dose as appropriate

## 2012-08-29 ENCOUNTER — Other Ambulatory Visit (HOSPITAL_COMMUNITY): Payer: Self-pay | Admitting: Obstetrics & Gynecology

## 2012-08-29 DIAGNOSIS — Z1231 Encounter for screening mammogram for malignant neoplasm of breast: Secondary | ICD-10-CM

## 2012-09-26 ENCOUNTER — Ambulatory Visit (HOSPITAL_COMMUNITY)
Admission: RE | Admit: 2012-09-26 | Discharge: 2012-09-26 | Disposition: A | Discharge status: Home / Self Care | Payer: Medicare Other | Source: Ambulatory Visit | Attending: Obstetrics & Gynecology | Admitting: Obstetrics & Gynecology

## 2012-09-26 ENCOUNTER — Ambulatory Visit (HOSPITAL_COMMUNITY): Payer: Medicare Other

## 2012-09-26 DIAGNOSIS — Z1231 Encounter for screening mammogram for malignant neoplasm of breast: Secondary | ICD-10-CM | POA: Insufficient documentation

## 2012-10-16 ENCOUNTER — Encounter: Payer: Self-pay | Admitting: Internal Medicine

## 2012-12-04 ENCOUNTER — Encounter: Payer: Self-pay | Admitting: Internal Medicine

## 2013-04-15 ENCOUNTER — Ambulatory Visit (INDEPENDENT_AMBULATORY_CARE_PROVIDER_SITE_OTHER): Payer: Medicare Other | Admitting: Family

## 2013-04-15 ENCOUNTER — Encounter: Payer: Self-pay | Admitting: Family

## 2013-04-15 ENCOUNTER — Telehealth: Payer: Self-pay | Admitting: Internal Medicine

## 2013-04-15 DIAGNOSIS — M7989 Other specified soft tissue disorders: Secondary | ICD-10-CM

## 2013-04-15 DIAGNOSIS — T6391XA Toxic effect of contact with unspecified venomous animal, accidental (unintentional), initial encounter: Secondary | ICD-10-CM

## 2013-04-15 DIAGNOSIS — T63461A Toxic effect of venom of wasps, accidental (unintentional), initial encounter: Secondary | ICD-10-CM

## 2013-04-15 MED ORDER — METHYLPREDNISOLONE 4 MG PO KIT: PACK | ORAL | Status: AC

## 2013-04-15 MED ORDER — METHYLPREDNISOLONE ACETATE 40 MG/ML IJ SUSP
80.0000 mg | Freq: Once | INTRAMUSCULAR | Status: AC
  Administered 2013-04-15: 80 mg via INTRAMUSCULAR

## 2013-04-15 NOTE — Progress Notes (Signed)
Subjective:    Patient ID: Julia Palmer, female    DOB: Jan 28, 1941, 72 y.o.   MRN: [id number]  HPI Pt is a 72 year old white female who presents to PCP after being stung by a bee on R middle finger on Monday. Pt states swelling and redness to R middle finger started on Monday and has since spread to to her wrist and lower arm. Pt states she has noticed redness to neck this AM. Denies any shortness of breath or chest tightness. Pt reports taking Claritin and Benadryl at home, however swelling and redness has continued.    Review of Systems  Constitutional: Negative.   HENT: Negative.   Eyes: Negative.   Respiratory: Negative.   Cardiovascular: Negative.   Gastrointestinal: Negative.   Endocrine: Negative.   Genitourinary: Negative.   Musculoskeletal: Negative.   Skin: Positive for color change.  Allergic/Immunologic: Negative.   Neurological: Negative.   Hematological: Negative.   Psychiatric/Behavioral: Negative.    Past Medical History  Diagnosis Date  . Menopause   . Iron deficiency anemia     History   Social History  . Marital Status: Married    Spouse Name: N/A    Number of Children: 1  . Years of Education: N/A   Occupational History  . [employer]   Social History Main Topics  . Smoking status: Never Smoker   . Smokeless tobacco: Not on file  . Alcohol Use: No  . Drug Use: No  . Sexually Active: Yes   Other Topics Concern  . Not on file   Social History Narrative  . No narrative on file    Past Surgical History  Procedure Laterality Date  . Foot surgery      Family History  Problem Relation Age of Onset  . Dementia Mother   . Stroke Father   . Cancer Sister     breast    Allergies  Allergen Reactions  . Codeine     REACTION: Nausea, Vomitting    Current Outpatient Prescriptions on File Prior to Visit  Medication Sig Dispense Refill  . Ascorbic Acid (VITAMIN C) 1000 MG tablet Take 1,000 mg by mouth daily.        . B  Complex-C (SUPER B COMPLEX PO) Take by mouth daily.        . Biotin 1000 MCG tablet Take 1,000 mcg by mouth daily.        . Calcium Carbonate (CALTRATE 600 PO) Take 500 mg by mouth daily.       . fish oil-omega-3 fatty acids 1000 MG capsule Take 2 g by mouth daily.        . furosemide (LASIX) 40 MG tablet take 1 tablet by mouth once daily  90 tablet  3  . Magnesium 200 MG TABS Take by mouth daily.        . Multiple Vitamins-Minerals (PRESERVISION AREDS 2) CAPS Take 2 capsules by mouth daily.      . nitroGLYCERIN (NITROSTAT) 0.4 MG SL tablet Place 0.4 mg under the tongue every 5 (five) minutes as needed.      . potassium chloride (KLOR-CON) 8 MEQ tablet Take 1 tablet (8 mEq total) by mouth 2 (two) times daily.  60 tablet  11   No current facility-administered medications on file prior to visit.    BP 120/80  Pulse 79  Wt 161 lb (73.029 kg)  BMI 26 kg/m2  SpO2 97%chart    Objective:   Physical Exam  Constitutional: She is oriented to person, place, and time. She appears well-developed and well-nourished.  HENT:  Head: Normocephalic and atraumatic.  Eyes: Pupils are equal, round, and reactive to light.  Neck: Normal range of motion.  Cardiovascular: Normal rate.   Pulmonary/Chest: Effort normal and breath sounds normal.  Abdominal: Soft. Bowel sounds are normal.  Musculoskeletal: Normal range of motion.  Neurological: She is alert and oriented to person, place, and time.  Skin: Skin is warm and dry. There is erythema.  Erythema and edema noted to R hand and extending to wrist. R middle finger has 2-3 vesicular lesions; finger is ecchymotic in color. Mild erythema noted to neck.          Assessment & Plan:  1. Bee Sting 2. Swelling of R finger  IM medrol shot administered to pt in office.  Pt prescribed Medrol pack and instructed to continue. Claritin and Benadryl at home. Pt instructed to follow up with PCP if symptoms worsen or persist.   Note by [facility], FNP Student

## 2013-04-15 NOTE — Telephone Encounter (Signed)
Patient Information:  Caller Name: Michaelyn  Phone: [phone]  Patient: Julia Palmer, Julia Palmer  Gender: Female  DOB: 27-Dec-1940  Age: 72 Years  PCP: Darryll Capers (Adults only)  Office Follow Up:  Does the office need to follow up with this patient?: No  Instructions For The Office: N/A  RN Note:  pt also reports she tried baking soda paste to the sting  Symptoms  Reason For Call & Symptoms: pt reports she was stung on the middle finger of right finger.  Pt states hand is swollen to the wrist.  There are little tiny bumps around the sting.  Finger is red  Reviewed Health History In EMR: Yes  Reviewed Medications In EMR: Yes  Reviewed Allergies In EMR: Yes  Reviewed Surgeries / Procedures: Yes  Date of Onset of Symptoms: 04/13/2013  Treatments Tried: Benadryl, Claritin  Treatments Tried Worked: No  Guideline(s) Used:  Warden/ranger  Disposition Per Guideline:   See Today in Office  Reason For Disposition Reached:   Red or very tender (to touch) area, getting larger over 48 hours after the sting  Advice Given:  N/A  Patient Will Follow Care Advice:  YES  Appointment Scheduled:  04/15/2013 09:45:00 Appointment Scheduled Provider:  Adline Mango (Family Practice)

## 2013-04-15 NOTE — Patient Instructions (Addendum)
Bee, Wasp, or Hornet Sting °Your caregiver has diagnosed you as having an insect sting. An insect sting appears as a red lump in the skin that sometimes has a tiny hole in the center, or it may have a stinger in the center of the wound. The most common stings are from wasps, hornets and bees. °Individuals have different reactions to insect stings. °· A normal reaction may cause pain, swelling, and redness around the sting site. °· A localized allergic reaction may cause swelling and redness that extends beyond the sting site. °· A large local reaction may continue to develop over the next 12 to 36 hours. °· On occasion, the reactions can be severe (anaphylactic reaction). An anaphylactic reaction may cause wheezing; difficulty breathing; chest pain; fainting; raised, itchy, red patches on the skin; a sick feeling to your stomach (nausea); vomiting; cramping; or diarrhea. If you have had an anaphylactic reaction to an insect sting in the past, you are more likely to have one again. °HOME CARE INSTRUCTIONS  °· With bee stings, a small sac of poison is left in the wound. Brushing across this with something such as a credit card, or anything similar, will help remove this and decrease the amount of the reaction. This same procedure will not help a wasp sting as they do not leave behind a stinger and poison sac. °· Apply a cold compress for 10 to 20 minutes every hour for 1 to 2 days, depending on severity, to reduce swelling and itching. °· To lessen pain, a paste made of water and baking soda may be rubbed on the bite or sting and left on for 5 minutes. °· To relieve itching and swelling, you may use take medication or apply medicated creams or lotions as directed. °· Only take over-the-counter or prescription medicines for pain, discomfort, or fever as directed by your caregiver. °· Wash the sting site daily with soap and water. Apply antibiotic ointment on the sting site as directed. °· If you suffered a severe  reaction: °· If you did not require hospitalization, an adult will need to stay with you for 24 hours in case the symptoms return. °· You may need to wear a medical bracelet or necklace stating the allergy. °· You and your family need to learn when and how to use an anaphylaxis kit or epinephrine injection. °· If you have had a severe reaction before, always carry your anaphylaxis kit with you. °SEEK MEDICAL CARE IF:  °· None of the above helps within 2 to 3 days. °· The area becomes red, warm, tender, and swollen beyond the area of the bite or sting. °· You have an oral temperature above 102° F (38.9° C). °SEEK IMMEDIATE MEDICAL CARE IF:  °You have symptoms of an allergic reaction which are: °· Wheezing. °· Difficulty breathing. °· Chest pain. °· Lightheadedness or fainting. °· Itchy, raised, red patches on the skin. °· Nausea, vomiting, cramping or diarrhea. °ANY OF THESE SYMPTOMS MAY REPRESENT A SERIOUS PROBLEM THAT IS AN EMERGENCY. Do not wait to see if the symptoms will go away. Get medical help right away. Call your local emergency services (911 in [place] DO NOT drive yourself to the hospital. °MAKE SURE YOU:  °· Understand these instructions. °· Will watch your condition. °· Will get help right away if you are not doing well or get worse. °Document Released: 10/01/2005 Document Revised: 12/24/2011 Document Reviewed: 03/18/2010 °ExitCare® Patient Information ©2014 [employer]. ° °

## 2013-04-20 ENCOUNTER — Telehealth: Payer: Self-pay | Admitting: Internal Medicine

## 2013-04-20 NOTE — Telephone Encounter (Signed)
[facility] Triage Call Report Triage Record Num: [id number] Operator: Valene Bors Patient Name: Julia Palmer Call Date & Time: 04/17/2013 10:59:26AM Patient Phone: [phone] PCP: Darryll Capers Patient Gender: Female PCP Fax : [phone] Patient DOB: 12/30/1940 [facility] Name[facility] Reason for Call: Caller: [name]/Patient; PCP: Darryll Capers (Adults only); CB#: [phone]; Call regarding Wasp sting this morning on R side of lip and R side of face and upper lip is swollen. She applied baking soda paste and sucking on ice. Swallowing and breathing fine. Seen in office on 04/15/13 for wasp sting to R hand and gave Prednisone shot and is on dose pack. Triage and Care advice per Bites and Stings Protocol and ER advised for "History of severe reaction to a similar bite/sting". Protocol(s) Used: Bites and Stings - Insects or Spiders Recommended Outcome per Protocol: See Provider within 4 hours Reason for Outcome: Bee, yellow jacket, hornet or wasp sting AND localized symptoms worsening after 12 hours of self care Care Advice: ~ 07/

## 2013-05-07 ENCOUNTER — Telehealth: Payer: Self-pay | Admitting: *Deleted

## 2013-05-07 NOTE — Telephone Encounter (Signed)
Pt here on 05/04/2013 for pt and c/o hematuria- urine done and had small leukocyte per dr Lovell Sheehan- start ceftin 250 bid for 14 days- [facility]

## 2013-08-24 ENCOUNTER — Other Ambulatory Visit: Payer: Self-pay | Admitting: Internal Medicine

## 2013-08-28 ENCOUNTER — Encounter: Payer: Medicare Other | Admitting: Internal Medicine

## 2013-09-07 ENCOUNTER — Ambulatory Visit (INDEPENDENT_AMBULATORY_CARE_PROVIDER_SITE_OTHER): Payer: Medicare Other | Admitting: Internal Medicine

## 2013-09-07 ENCOUNTER — Encounter: Payer: Self-pay | Admitting: Internal Medicine

## 2013-09-07 VITALS — BP 120/78 | HR 72 | Temp 98.2°F | Resp 16 | Ht 66.0 in | Wt 150.0 lb

## 2013-09-07 DIAGNOSIS — E785 Hyperlipidemia, unspecified: Secondary | ICD-10-CM

## 2013-09-07 DIAGNOSIS — I7300 Raynaud's syndrome without gangrene: Secondary | ICD-10-CM

## 2013-09-07 DIAGNOSIS — R609 Edema, unspecified: Secondary | ICD-10-CM

## 2013-09-07 DIAGNOSIS — R252 Cramp and spasm: Secondary | ICD-10-CM

## 2013-09-07 DIAGNOSIS — D500 Iron deficiency anemia secondary to blood loss (chronic): Secondary | ICD-10-CM

## 2013-09-07 DIAGNOSIS — Z0000 Encounter for general adult medical examination without abnormal findings: Secondary | ICD-10-CM

## 2013-09-07 DIAGNOSIS — T887XXA Unspecified adverse effect of drug or medicament, initial encounter: Secondary | ICD-10-CM

## 2013-09-07 LAB — TSH: TSH: 2.03 u[IU]/mL (ref 0.35–5.50)

## 2013-09-07 LAB — LIPID PANEL
Cholesterol: 161 mg/dL (ref 0–200)
HDL: 72.5 mg/dL (ref >39.00)
Total CHOL/HDL Ratio: 2
Triglycerides: 39 mg/dL (ref 0.0–149.0)
VLDL: 7.8 mg/dL (ref 0.0–40.0)

## 2013-09-07 LAB — CBC WITH DIFFERENTIAL/PLATELET
Basophils Absolute: 0 10*3/uL (ref 0.0–0.1)
Eosinophils Absolute: 0.6 10*3/uL (ref 0.0–0.7)
HCT: 36.6 % (ref 36.0–46.0)
Hemoglobin: 12.1 g/dL (ref 12.0–15.0)
Lymphocytes Relative: 28.2 % (ref 12.0–46.0)
Lymphs Abs: 1.5 10*3/uL (ref 0.7–4.0)
MCV: 86.6 fl (ref 78.0–100.0)
Monocytes Absolute: 0.4 10*3/uL (ref 0.1–1.0)
Monocytes Relative: 7.5 % (ref 3.0–12.0)
Neutro Abs: 2.7 10*3/uL (ref 1.4–7.7)
Platelets: 234 10*3/uL (ref 150.0–400.0)
RDW: 21.2 % — ABNORMAL HIGH (ref 11.5–14.6)

## 2013-09-07 LAB — BASIC METABOLIC PANEL
BUN: 21 mg/dL (ref 6–23)
CO2: 27 mEq/L (ref 19–32)
Calcium: 8.9 mg/dL (ref 8.4–10.5)
Creatinine, Ser: 0.7 mg/dL (ref 0.4–1.2)
GFR: 95.49 mL/min (ref >60.00)
Glucose, Bld: 77 mg/dL (ref 70–99)
Sodium: 139 mEq/L (ref 135–145)

## 2013-09-07 LAB — HEPATIC FUNCTION PANEL
Albumin: 3.8 g/dL (ref 3.5–5.2)
Alkaline Phosphatase: 38 U/L — ABNORMAL LOW (ref 39–117)
Total Protein: 6.6 g/dL (ref 6.0–8.3)

## 2013-09-07 NOTE — Progress Notes (Signed)
Pre visit review using our clinic review tool, if applicable. No additional management support is needed unless otherwise documented below in the visit note. 

## 2013-09-07 NOTE — Patient Instructions (Signed)
The patient is instructed to continue all medications as prescribed. Schedule followup with check out clerk upon leaving the clinic   Check potassium and magnesium on my chart

## 2013-09-07 NOTE — Progress Notes (Signed)
Subjective:    Patient ID: Julia Palmer, female    DOB: 1940/11/28, 72 y.o.   MRN: [id number]  HPI Medicare wellness Red palms at night, no red feet Had surgery for hammer toes Hx of raynauds that has worsened    Review of Systems  Constitutional: Negative for activity change, appetite change and fatigue.  HENT: Negative for congestion, ear pain, postnasal drip and sinus pressure.   Eyes: Negative for redness and visual disturbance.  Respiratory: Negative for cough, shortness of breath and wheezing.   Cardiovascular: Positive for leg swelling.  Gastrointestinal: Negative for abdominal pain and abdominal distention.  Genitourinary: Negative for dysuria, frequency and menstrual problem.  Musculoskeletal: Negative for arthralgias, joint swelling, myalgias and neck pain.  Skin: Positive for pallor. Negative for rash and wound.       Extremities and palms  Cool are clear and red  Neurological: Negative for dizziness, weakness and headaches.  Hematological: Negative for adenopathy. Does not bruise/bleed easily.  Psychiatric/Behavioral: Negative for sleep disturbance and decreased concentration.   Past Medical History  Diagnosis Date  . Menopause   . Iron deficiency anemia     History   Social History  . Marital Status: Married    Spouse Name: N/A    Number of Children: 1  . Years of Education: N/A   Occupational History  . [employer]   Social History Main Topics  . Smoking status: Never Smoker   . Smokeless tobacco: Not on file  . Alcohol Use: No  . Drug Use: No  . Sexual Activity: Yes   Other Topics Concern  . Not on file   Social History Narrative  . No narrative on file    Past Surgical History  Procedure Laterality Date  . Foot surgery      Family History  Problem Relation Age of Onset  . Dementia Mother   . Stroke Father   . Cancer Sister     breast    Allergies  Allergen Reactions  . Codeine     REACTION: Nausea, Vomitting     Current Outpatient Prescriptions on File Prior to Visit  Medication Sig Dispense Refill  . Ascorbic Acid (VITAMIN C) 1000 MG tablet Take 1,000 mg by mouth daily.        . B Complex-C (SUPER B COMPLEX PO) Take by mouth daily.        . Biotin 1000 MCG tablet Take 1,000 mcg by mouth daily.        . Calcium Carbonate (CALTRATE 600 PO) Take 500 mg by mouth daily.       . fish oil-omega-3 fatty acids 1000 MG capsule Take 2 g by mouth daily.        . potassium chloride (KLOR-CON) 8 MEQ tablet Take 1 tablet (8 mEq total) by mouth 2 (two) times daily.  60 tablet  11   No current facility-administered medications on file prior to visit.    BP 120/78  Pulse 72  Temp(Src) 98.2 F (36.8 C)  Resp 16  Ht 5\' 6"  (1.676 m)  Wt 150 lb (68.04 kg)  BMI 24.22 kg/m2       Objective:   Physical Exam  Nursing note and vitals reviewed. Constitutional: She appears well-developed and well-nourished.  HENT:  Head: Normocephalic and atraumatic.  Neck: Normal range of motion. Neck supple.  Cardiovascular:  Murmur heard. Pulmonary/Chest: Effort normal and breath sounds normal.  Abdominal: Soft. Bowel sounds are normal.  Musculoskeletal: She exhibits edema  and tenderness.  Skin: There is erythema.          Assessment & Plan:  Palmar erythema we'll complete a workup including an ANA cold agglutinins liver functions.  Screen for cholesterol today.  Monitoring for digital side effects of supplements with history of peripheral edema and history of iron deficiency anemia we'll screen with LIpid, TSH and complete metabolic panel  For the cramping monitor for magnesium and potassium Subjective:    Julia Palmer is a 72 y.o. female who presents for Medicare Annual/Subsequent preventive examination.  Preventive Screening-Counseling & Management  Tobacco History  Smoking status  . Never Smoker   Smokeless tobacco  . Not on file     Problems Prior to Visit 1.   Current Problems  (verified) Patient Active Problem List   Diagnosis Date Noted  . Leg cramps 08/24/2011  . Raynaud phenomenon 08/24/2011  . Varicose veins of lower extremities with inflammation 08/21/2010  . LEG PAIN, LEFT 04/12/2010  . WEIGHT LOSS, RECENT 01/12/2008  . CHEST PAIN UNSPECIFIED 01/12/2008  . IRON DEFICIENCY ANEMIA SECONDARY TO BLOOD LOSS 09/29/2007  . EFFUSION OF ANKLE AND FOOT JOINT 09/29/2007  . OSTEOPENIA 09/29/2007  . Edema 09/29/2007    Medications Prior to Visit Current Outpatient Prescriptions on File Prior to Visit  Medication Sig Dispense Refill  . Ascorbic Acid (VITAMIN C) 1000 MG tablet Take 1,000 mg by mouth daily.        . B Complex-C (SUPER B COMPLEX PO) Take by mouth daily.        . Biotin 1000 MCG tablet Take 1,000 mcg by mouth daily.        . Calcium Carbonate (CALTRATE 600 PO) Take 500 mg by mouth daily.       . fish oil-omega-3 fatty acids 1000 MG capsule Take 2 g by mouth daily.        . potassium chloride (KLOR-CON) 8 MEQ tablet Take 1 tablet (8 mEq total) by mouth 2 (two) times daily.  60 tablet  11   No current facility-administered medications on file prior to visit.    Current Medications (verified) Current Outpatient Prescriptions  Medication Sig Dispense Refill  . Ascorbic Acid (VITAMIN C) 1000 MG tablet Take 1,000 mg by mouth daily.        . B Complex-C (SUPER B COMPLEX PO) Take by mouth daily.        . Biotin 1000 MCG tablet Take 1,000 mcg by mouth daily.        . Calcium Carbonate (CALTRATE 600 PO) Take 500 mg by mouth daily.       . Cholecalciferol (VITAMIN D-3 PO) Take 1 tablet by mouth daily.      . fish oil-omega-3 fatty acids 1000 MG capsule Take 2 g by mouth daily.        . multivitamin-lutein (OCUVITE-LUTEIN) CAPS capsule Take 1 capsule by mouth daily.      . potassium chloride (KLOR-CON) 8 MEQ tablet Take 1 tablet (8 mEq total) by mouth 2 (two) times daily.  60 tablet  11   No current facility-administered medications for this visit.      Allergies (verified) Codeine   PAST HISTORY  Family History Family History  Problem Relation Age of Onset  . Dementia Mother   . Stroke Father   . Cancer Sister     breast    Social History History  Substance Use Topics  . Smoking status: Never Smoker   . Smokeless tobacco: Not on  file  . Alcohol Use: No     Are there smokers in your home (other than you)? No  Risk Factors Current exercise habits: The patient does not participate in regular exercise at present.  Dietary issues discussed: stable   Cardiac risk factors: advanced age (older than 76 for men, 46 for women).  Depression Screen (Note: if answer to either of the following is "Yes", a more complete depression screening is indicated)   Over the past two weeks, have you felt down, depressed or hopeless? No  Over the past two weeks, have you felt little interest or pleasure in doing things? No  Have you lost interest or pleasure in daily life? No  Do you often feel hopeless? No  Do you cry easily over simple problems? No  Activities of Daily Living In your present state of health, do you have any difficulty performing the following activities?:  Driving? No Managing money?  No Feeding yourself? No Getting from bed to chair? No Climbing a flight of stairs? No Preparing food and eating?: No Bathing or showering? No Getting dressed: No Getting to the toilet? No Using the toilet:No Moving around from place to place: No In the past year have you fallen or had a near fall?:No   Are you sexually active?  Yes  Do you have more than one partner?  No  Hearing Difficulties: No Do you often ask people to speak up or repeat themselves? No Do you experience ringing or noises in your ears? No Do you have difficulty understanding soft or whispered voices? No   Do you feel that you have a problem with memory? No  Do you often misplace items? No  Do you feel safe at home?  Yes  Cognitive Testing  Alert? Yes   Normal Appearance?Yes  Oriented to person? Yes  Place? Yes   Time? Yes  Recall of three objects?  Yes  Can perform simple calculations? Yes  Displays appropriate judgment?Yes  Can read the correct time from a watch face?Yes   Advanced Directives have been discussed with the patient? Yes  List the Names of Other Physician/Practitioners you currently use: 1.    Indicate any recent Medical Services you may have received from other than [facility] providers in the past year (date may be approximate).  Immunization History  Administered Date(s) Administered  . Influenza Split 07/31/2012  . Influenza Whole 07/23/2008, 08/12/2009, 08/15/2010  . Influenza, High Dose Seasonal PF 08/03/2013  . Pneumococcal Polysaccharide-23 08/21/2010  . Td 09/15/2007    Screening Tests Health Maintenance  Topic Date Due  . Colonoscopy  08/26/1992  . Zostavax  08/26/2002  . Influenza Vaccine  05/15/2014  . Tetanus/tdap  09/14/2017  . Pneumococcal Polysaccharide Vaccine Age 87 And Over  Completed    All answers were reviewed with the patient and necessary referrals were made:  Carrie Mew, MD   09/07/2013   History reviewed: allergies, current medications, past family history, past medical history, past social history, past surgical history and problem list  Review of Systems Pertinent items are noted in HPI.    Objective:     Vision by Snellen chart: right eye:20/20, left eye:20/20  Body mass index is 24.22 kg/(m^2). BP 120/78  Pulse 72  Temp(Src) 98.2 F (36.8 C)  Resp 16  Ht 5\' 6"  (1.676 m)  Wt 150 lb (68.04 kg)  BMI 24.22 kg/m2  BP 120/78  Pulse 72  Temp(Src) 98.2 F (36.8 C)  Resp 16  Ht 5\' 6"  (  1.676 m)  Wt 150 lb (68.04 kg)  BMI 24.22 kg/m2  General Appearance:    Alert, cooperative, no distress, appears stated age  Head:    Normocephalic, without obvious abnormality, atraumatic  Eyes:    PERRL, conjunctiva/corneas clear, EOM's intact, fundi    benign, both eyes   Ears:    Normal TM's and external ear canals, both ears  Nose:   Nares normal, septum midline, mucosa normal, no drainage    or sinus tenderness  Throat:   Lips, mucosa, and tongue normal; teeth and gums normal  Neck:   Supple, symmetrical, trachea midline, no adenopathy;    thyroid:  no enlargement/tenderness/nodules; no carotid   bruit or JVD  Back:     Symmetric, no curvature, ROM normal, no CVA tenderness  Lungs:     Clear to auscultation bilaterally, respirations unlabored  Chest Wall:    No tenderness or deformity   Heart:    Regular rate and rhythm, S1 and S2 normal, no murmur, rub   or gallop  Breast Exam:    No tenderness, masses, or nipple abnormality  Abdomen:     Soft, non-tender, bowel sounds active all four quadrants,    no masses, no organomegaly  Genitalia:    Normal female without lesion, discharge or tenderness  Rectal:    Normal tone, normal prostate, no masses or tenderness;   guaiac negative stool  Extremities:   Extremities normal, atraumatic, no cyanosis or edema  Pulses:   2+ and symmetric all extremities  Skin:   Skin color, texture, turgor normal, no rashes or lesions  Lymph nodes:   Cervical, supraclavicular, and axillary nodes normal  Neurologic:   CNII-XII intact, normal strength, sensation and reflexes    throughout       Assessment:      This is a routine physical examination for this healthy  Female. Reviewed all health maintenance protocols including mammography colonoscopy bone density and reviewed appropriate screening labs. Her immunization history was reviewed as well as her current medications and allergies refills of her chronic medications were given and the plan for yearly health maintenance was discussed all orders and referrals were made as appropriate.      Plan:     During the course of the visit the patient was educated and counseled about appropriate screening and preventive services including:    Pneumococcal vaccine    Influenza vaccine  Diet review for nutrition referral? Yes ____  Not Indicated __x__   Patient Instructions (the written plan) was given to the patient.  Medicare Attestation I have personally reviewed: The patient's medical and social history Their use of alcohol, tobacco or illicit drugs Their current medications and supplements The patient's functional ability including ADLs,fall risks, home safety risks, cognitive, and hearing and visual impairment Diet and physical activities Evidence for depression or mood disorders  The patient's weight, height, BMI, and visual acuity have been recorded in the chart.  I have made referrals, counseling, and provided education to the patient based on review of the above and I have provided the patient with a written personalized care plan for preventive services.     Carrie Mew, MD   09/07/2013

## 2013-09-08 ENCOUNTER — Other Ambulatory Visit (HOSPITAL_COMMUNITY): Payer: Self-pay | Admitting: Obstetrics & Gynecology

## 2013-09-08 DIAGNOSIS — Z1231 Encounter for screening mammogram for malignant neoplasm of breast: Secondary | ICD-10-CM

## 2013-09-08 LAB — ANA: Anti Nuclear Antibody(ANA): NEGATIVE

## 2013-09-10 LAB — COLD AGGLUTININ TITER

## 2013-09-17 ENCOUNTER — Encounter: Payer: Self-pay | Admitting: Internal Medicine

## 2013-09-17 DIAGNOSIS — R748 Abnormal levels of other serum enzymes: Secondary | ICD-10-CM

## 2013-09-18 ENCOUNTER — Other Ambulatory Visit: Payer: Self-pay | Admitting: *Deleted

## 2013-09-18 MED ORDER — POTASSIUM CHLORIDE ER 10 MEQ PO TBCR: 10.0000 meq | EXTENDED_RELEASE_TABLET | Freq: Every day | ORAL | Status: DC

## 2013-09-25 ENCOUNTER — Ambulatory Visit
Admission: RE | Admit: 2013-09-25 | Discharge: 2013-09-25 | Disposition: A | Discharge status: Home / Self Care | Payer: Medicare Other | Source: Ambulatory Visit | Attending: Internal Medicine | Admitting: Internal Medicine

## 2013-09-25 DIAGNOSIS — R748 Abnormal levels of other serum enzymes: Secondary | ICD-10-CM

## 2013-09-29 ENCOUNTER — Ambulatory Visit (HOSPITAL_COMMUNITY)
Admission: RE | Admit: 2013-09-29 | Discharge: 2013-09-29 | Disposition: A | Discharge status: Home / Self Care | Payer: Medicare Other | Source: Ambulatory Visit | Attending: Obstetrics & Gynecology | Admitting: Obstetrics & Gynecology

## 2013-09-29 DIAGNOSIS — Z1231 Encounter for screening mammogram for malignant neoplasm of breast: Secondary | ICD-10-CM | POA: Insufficient documentation

## 2014-01-06 ENCOUNTER — Other Ambulatory Visit (HOSPITAL_COMMUNITY): Payer: Self-pay | Admitting: Obstetrics & Gynecology

## 2014-01-06 DIAGNOSIS — N951 Menopausal and female climacteric states: Secondary | ICD-10-CM

## 2014-01-11 ENCOUNTER — Ambulatory Visit (HOSPITAL_COMMUNITY)
Admission: RE | Admit: 2014-01-11 | Discharge: 2014-01-11 | Disposition: A | Discharge status: Home / Self Care | Payer: Medicare Other | Source: Ambulatory Visit | Attending: Obstetrics & Gynecology | Admitting: Obstetrics & Gynecology

## 2014-01-11 DIAGNOSIS — Z13820 Encounter for screening for osteoporosis: Secondary | ICD-10-CM | POA: Insufficient documentation

## 2014-01-11 DIAGNOSIS — N951 Menopausal and female climacteric states: Secondary | ICD-10-CM

## 2014-01-11 DIAGNOSIS — Z780 Asymptomatic menopausal state: Secondary | ICD-10-CM | POA: Insufficient documentation

## 2014-02-16 ENCOUNTER — Encounter: Payer: Self-pay | Admitting: Internal Medicine

## 2014-03-16 ENCOUNTER — Other Ambulatory Visit: Payer: Self-pay | Admitting: Internal Medicine

## 2014-05-21 ENCOUNTER — Encounter (HOSPITAL_BASED_OUTPATIENT_CLINIC_OR_DEPARTMENT_OTHER): Payer: Self-pay | Admitting: *Deleted

## 2014-05-21 NOTE — Progress Notes (Signed)
Healthy lady-has not had to take lasix and kcl over week-if she takes it before surgery-need istat-she says she will not need it Had her rt ft dome last yr

## 2014-05-26 ENCOUNTER — Other Ambulatory Visit: Payer: Self-pay | Admitting: Physician Assistant

## 2014-05-27 ENCOUNTER — Encounter (HOSPITAL_BASED_OUTPATIENT_CLINIC_OR_DEPARTMENT_OTHER): Payer: Medicare Other | Admitting: Certified Registered"

## 2014-05-27 ENCOUNTER — Encounter (HOSPITAL_BASED_OUTPATIENT_CLINIC_OR_DEPARTMENT_OTHER)
Admission: RE | Disposition: A | Discharge status: Home / Self Care | Payer: Self-pay | Source: Ambulatory Visit | Attending: Orthopedic Surgery

## 2014-05-27 ENCOUNTER — Ambulatory Visit (HOSPITAL_BASED_OUTPATIENT_CLINIC_OR_DEPARTMENT_OTHER)
Admission: RE | Admit: 2014-05-27 | Discharge: 2014-05-27 | Disposition: A | Discharge status: Home / Self Care | Payer: Medicare Other | Source: Ambulatory Visit | Attending: Orthopedic Surgery | Admitting: Orthopedic Surgery

## 2014-05-27 ENCOUNTER — Encounter (HOSPITAL_BASED_OUTPATIENT_CLINIC_OR_DEPARTMENT_OTHER): Payer: Self-pay | Admitting: Certified Registered"

## 2014-05-27 ENCOUNTER — Ambulatory Visit (HOSPITAL_BASED_OUTPATIENT_CLINIC_OR_DEPARTMENT_OTHER): Payer: Medicare Other | Admitting: Certified Registered"

## 2014-05-27 DIAGNOSIS — M2010 Hallux valgus (acquired), unspecified foot: Secondary | ICD-10-CM | POA: Insufficient documentation

## 2014-05-27 DIAGNOSIS — M2040 Other hammer toe(s) (acquired), unspecified foot: Secondary | ICD-10-CM | POA: Insufficient documentation

## 2014-05-27 DIAGNOSIS — D509 Iron deficiency anemia, unspecified: Secondary | ICD-10-CM | POA: Insufficient documentation

## 2014-05-27 DIAGNOSIS — M6240 Contracture of muscle, unspecified site: Secondary | ICD-10-CM | POA: Diagnosis not present

## 2014-05-27 DIAGNOSIS — Z885 Allergy status to narcotic agent status: Secondary | ICD-10-CM | POA: Diagnosis not present

## 2014-05-27 DIAGNOSIS — Q66219 Congenital metatarsus primus varus, unspecified foot: Secondary | ICD-10-CM | POA: Diagnosis not present

## 2014-05-27 DIAGNOSIS — M2012 Hallux valgus (acquired), left foot: Secondary | ICD-10-CM

## 2014-05-27 DIAGNOSIS — M810 Age-related osteoporosis without current pathological fracture: Secondary | ICD-10-CM | POA: Diagnosis not present

## 2014-05-27 HISTORY — DX: Age-related osteoporosis without current pathological fracture: M81.0

## 2014-05-27 HISTORY — DX: Presence of spectacles and contact lenses: Z97.3

## 2014-05-27 HISTORY — PX: BUNIONECTOMY WITH WEIL OSTEOTOMY: SHX5604

## 2014-05-27 LAB — POCT HEMOGLOBIN-HEMACUE: HEMOGLOBIN: 13.5 g/dL (ref 12.0–15.0)

## 2014-05-27 SURGERY — BUNIONECTOMY WITH WEIL OSTEOTOMY
Anesthesia: General | Site: Foot | Laterality: Left

## 2014-05-27 MED ORDER — ACETAMINOPHEN 500 MG PO TABS
ORAL_TABLET | ORAL | Status: AC
  Filled 2014-05-27: qty 2

## 2014-05-27 MED ORDER — ACETAMINOPHEN 500 MG PO TABS
1000.0000 mg | ORAL_TABLET | Freq: Once | ORAL | Status: AC
  Administered 2014-05-27: 1000 mg via ORAL

## 2014-05-27 MED ORDER — CEFAZOLIN SODIUM-DEXTROSE 2-3 GM-% IV SOLR
INTRAVENOUS | Status: AC
  Filled 2014-05-27: qty 50

## 2014-05-27 MED ORDER — PROMETHAZINE HCL 12.5 MG PO TABS: 12.5000 mg | ORAL_TABLET | ORAL | Status: DC | PRN

## 2014-05-27 MED ORDER — 0.9 % SODIUM CHLORIDE (POUR BTL) OPTIME
TOPICAL | Status: DC | PRN
  Administered 2014-05-27: 200 mL

## 2014-05-27 MED ORDER — SCOPOLAMINE 1 MG/3DAYS TD PT72
1.0000 | MEDICATED_PATCH | TRANSDERMAL | Status: DC
  Administered 2014-05-27: 1.5 mg via TRANSDERMAL

## 2014-05-27 MED ORDER — CHLORHEXIDINE GLUCONATE 4 % EX LIQD: 60.0000 mL | Freq: Once | CUTANEOUS | Status: DC

## 2014-05-27 MED ORDER — SCOPOLAMINE 1 MG/3DAYS TD PT72
MEDICATED_PATCH | TRANSDERMAL | Status: AC
  Filled 2014-05-27: qty 1

## 2014-05-27 MED ORDER — CEFAZOLIN SODIUM-DEXTROSE 2-3 GM-% IV SOLR
2.0000 g | INTRAVENOUS | Status: AC
  Administered 2014-05-27: 2 g via INTRAVENOUS

## 2014-05-27 MED ORDER — FENTANYL CITRATE 0.05 MG/ML IJ SOLN
INTRAMUSCULAR | Status: AC
  Filled 2014-05-27: qty 2

## 2014-05-27 MED ORDER — BUPIVACAINE-EPINEPHRINE (PF) 0.5% -1:200000 IJ SOLN
INTRAMUSCULAR | Status: DC | PRN
  Administered 2014-05-27: 30 mL via PERINEURAL

## 2014-05-27 MED ORDER — PROPOFOL 10 MG/ML IV BOLUS
INTRAVENOUS | Status: DC | PRN
  Administered 2014-05-27: 30 mg via INTRAVENOUS
  Administered 2014-05-27: 150 mg via INTRAVENOUS

## 2014-05-27 MED ORDER — SCOPOLAMINE 1 MG/3DAYS TD PT72
MEDICATED_PATCH | TRANSDERMAL | Status: DC | PRN
  Administered 2014-05-27: 1 via TRANSDERMAL

## 2014-05-27 MED ORDER — MIDAZOLAM HCL 2 MG/2ML IJ SOLN
INTRAMUSCULAR | Status: AC
  Filled 2014-05-27: qty 2

## 2014-05-27 MED ORDER — BACITRACIN ZINC 500 UNIT/GM EX OINT
TOPICAL_OINTMENT | CUTANEOUS | Status: DC | PRN
  Administered 2014-05-27: 1 via TOPICAL

## 2014-05-27 MED ORDER — ONDANSETRON HCL 4 MG/2ML IJ SOLN
INTRAMUSCULAR | Status: DC | PRN
  Administered 2014-05-27: 4 mg via INTRAVENOUS

## 2014-05-27 MED ORDER — BACITRACIN ZINC 500 UNIT/GM EX OINT
TOPICAL_OINTMENT | CUTANEOUS | Status: AC
  Filled 2014-05-27: qty 28.35

## 2014-05-27 MED ORDER — PROPOFOL 10 MG/ML IV EMUL
INTRAVENOUS | Status: AC
  Filled 2014-05-27: qty 100

## 2014-05-27 MED ORDER — SODIUM CHLORIDE 0.9 % IV SOLN
INTRAVENOUS | Status: DC
Start: 2014-05-27 — End: 2014-05-27

## 2014-05-27 MED ORDER — LACTATED RINGERS IV SOLN
INTRAVENOUS | Status: DC
  Administered 2014-05-27 (×2): via INTRAVENOUS

## 2014-05-27 MED ORDER — DEXAMETHASONE SODIUM PHOSPHATE 10 MG/ML IJ SOLN
INTRAMUSCULAR | Status: DC | PRN
  Administered 2014-05-27: 10 mg via INTRAVENOUS

## 2014-05-27 MED ORDER — FENTANYL CITRATE 0.05 MG/ML IJ SOLN
50.0000 ug | INTRAMUSCULAR | Status: DC | PRN
  Administered 2014-05-27: 100 ug via INTRAVENOUS

## 2014-05-27 MED ORDER — FENTANYL CITRATE 0.05 MG/ML IJ SOLN
INTRAMUSCULAR | Status: AC
  Filled 2014-05-27: qty 6

## 2014-05-27 MED ORDER — MIDAZOLAM HCL 2 MG/2ML IJ SOLN
1.0000 mg | INTRAMUSCULAR | Status: DC | PRN
  Administered 2014-05-27: 1 mg via INTRAVENOUS

## 2014-05-27 SURGICAL SUPPLY — 77 items
BANDAGE ESMARK 6X9 LF (GAUZE/BANDAGES/DRESSINGS) IMPLANT
BIT DRILL 1.7 CANN W/AO CONN (BIT) IMPLANT
BIT DRILL 1.7 LOW PROFILE (BIT) ×2 IMPLANT
BLADE AVERAGE 25MMX9MM (BLADE) ×1
BLADE AVERAGE 25X9 (BLADE) ×2 IMPLANT
BLADE OSC/SAG .038X5.5 CUT EDG (BLADE) ×3 IMPLANT
BLADE SURG 15 STRL LF DISP TIS (BLADE) ×3 IMPLANT
BLADE SURG 15 STRL SS (BLADE) ×9
BNDG CMPR 9X4 STRL LF SNTH (GAUZE/BANDAGES/DRESSINGS)
BNDG CMPR 9X6 STRL LF SNTH (GAUZE/BANDAGES/DRESSINGS) ×1
BNDG COHESIVE 4X5 TAN STRL (GAUZE/BANDAGES/DRESSINGS) ×3 IMPLANT
BNDG CONFORM 3 STRL LF (GAUZE/BANDAGES/DRESSINGS) ×3 IMPLANT
BNDG ESMARK 4X9 LF (GAUZE/BANDAGES/DRESSINGS) IMPLANT
BNDG ESMARK 6X9 LF (GAUZE/BANDAGES/DRESSINGS) ×3
BNDG GAUZE ELAST 4 BULKY (GAUZE/BANDAGES/DRESSINGS) IMPLANT
CHLORAPREP W/TINT 26ML (MISCELLANEOUS) ×3 IMPLANT
CLOSURE WOUND 1/2 X4 (GAUZE/BANDAGES/DRESSINGS)
COVER TABLE BACK 60X90 (DRAPES) ×3 IMPLANT
CUFF TOURNIQUET SINGLE 24IN (TOURNIQUET CUFF) ×2 IMPLANT
CUFF TOURNIQUET SINGLE 34IN LL (TOURNIQUET CUFF) IMPLANT
DRAPE EXTREMITY T 121X128X90 (DRAPE) ×3 IMPLANT
DRAPE OEC MINIVIEW 54X84 (DRAPES) ×3 IMPLANT
DRAPE U-SHAPE 47X51 STRL (DRAPES) ×3 IMPLANT
DRSG EMULSION OIL 3X3 NADH (GAUZE/BANDAGES/DRESSINGS) ×3 IMPLANT
DRSG PAD ABDOMINAL 8X10 ST (GAUZE/BANDAGES/DRESSINGS) ×2 IMPLANT
ELECT REM PT RETURN 9FT ADLT (ELECTROSURGICAL) ×3
ELECTRODE REM PT RTRN 9FT ADLT (ELECTROSURGICAL) ×1 IMPLANT
GAUZE SPONGE 4X4 12PLY STRL (GAUZE/BANDAGES/DRESSINGS) ×6 IMPLANT
GLOVE BIO SURGEON STRL SZ7 (GLOVE) ×3 IMPLANT
GLOVE BIO SURGEON STRL SZ8 (GLOVE) ×3 IMPLANT
GLOVE BIOGEL PI IND STRL 7.0 (GLOVE) ×1 IMPLANT
GLOVE BIOGEL PI IND STRL 8 (GLOVE) ×1 IMPLANT
GLOVE BIOGEL PI INDICATOR 7.0 (GLOVE) ×4
GLOVE BIOGEL PI INDICATOR 8 (GLOVE) ×2
GLOVE ECLIPSE 6.5 STRL STRAW (GLOVE) ×2 IMPLANT
GLOVE EXAM NITRILE MD LF STRL (GLOVE) ×2 IMPLANT
GOWN STRL REUS W/ TWL LRG LVL3 (GOWN DISPOSABLE) ×2 IMPLANT
GOWN STRL REUS W/ TWL XL LVL3 (GOWN DISPOSABLE) ×1 IMPLANT
GOWN STRL REUS W/TWL LRG LVL3 (GOWN DISPOSABLE) ×6
GOWN STRL REUS W/TWL XL LVL3 (GOWN DISPOSABLE) ×3
GUIDEWIRE .08 (WIRE) ×2 IMPLANT
K-WIRE .054X4 (WIRE) ×2 IMPLANT
KIT INSTRUMENT PRO-TOE VO (KITS) ×2 IMPLANT
NDL HYPO 25X1 1.5 SAFETY (NEEDLE) IMPLANT
NEEDLE HYPO 22GX1.5 SAFETY (NEEDLE) IMPLANT
NEEDLE HYPO 25X1 1.5 SAFETY (NEEDLE) IMPLANT
NS IRRIG 1000ML POUR BTL (IV SOLUTION) ×3 IMPLANT
PACK BASIN DAY SURGERY FS (CUSTOM PROCEDURE TRAY) ×3 IMPLANT
PAD CAST 4YDX4 CTTN HI CHSV (CAST SUPPLIES) IMPLANT
PADDING CAST COTTON 4X4 STRL (CAST SUPPLIES)
PENCIL BUTTON HOLSTER BLD 10FT (ELECTRODE) ×2 IMPLANT
PROTOE 2.4 (Toe) ×2 IMPLANT
SANITIZER HAND PURELL 535ML FO (MISCELLANEOUS) ×3 IMPLANT
SCREW CANN 2.3X28 (Screw) ×2 IMPLANT
SCREW CORTICAL 2.3X14MM (Screw) ×2 IMPLANT
SCREW CORTICAL 2.3X20 (Screw) ×2 IMPLANT
SCREW QUICK FIX 2X14 (Screw) ×2 IMPLANT
SHEET MEDIUM DRAPE 40X70 STRL (DRAPES) ×3 IMPLANT
SPONGE LAP 18X18 X RAY DECT (DISPOSABLE) ×3 IMPLANT
STOCKINETTE 6  STRL (DRAPES) ×2
STOCKINETTE 6 STRL (DRAPES) ×1 IMPLANT
STRIP CLOSURE SKIN 1/2X4 (GAUZE/BANDAGES/DRESSINGS) IMPLANT
SUCTION FRAZIER TIP 10 FR DISP (SUCTIONS) ×3 IMPLANT
SUT ETHILON 3 0 PS 1 (SUTURE) ×7 IMPLANT
SUT MNCRL AB 3-0 PS2 18 (SUTURE) ×4 IMPLANT
SUT PROLENE 3 0 PS 1 (SUTURE) IMPLANT
SUT VIC AB 0 SH 27 (SUTURE) ×1 IMPLANT
SUT VIC AB 2-0 SH 27 (SUTURE) ×3
SUT VIC AB 2-0 SH 27XBRD (SUTURE) IMPLANT
SYR BULB 3OZ (MISCELLANEOUS) ×3 IMPLANT
SYR CONTROL 10ML LL (SYRINGE) IMPLANT
TOWEL OR 17X24 6PK STRL BLUE (TOWEL DISPOSABLE) ×3 IMPLANT
TOWEL OR NON WOVEN STRL DISP B (DISPOSABLE) ×1 IMPLANT
TUBE CONNECTING 20'X1/4 (TUBING)
TUBE CONNECTING 20X1/4 (TUBING) IMPLANT
UNDERPAD 30X30 INCONTINENT (UNDERPADS AND DIAPERS) ×3 IMPLANT
YANKAUER SUCT BULB TIP NO VENT (SUCTIONS) IMPLANT

## 2014-05-27 NOTE — Progress Notes (Signed)
Assisted Dr. Singer with left, ultrasound guided, popliteal/saphenous block. Side rails up, monitors on throughout procedure. See vital signs in flow sheet. Tolerated Procedure well. 

## 2014-05-27 NOTE — Anesthesia Procedure Notes (Addendum)
Anesthesia Regional Block:  Popliteal block  Pre-Anesthetic Checklist: ,, timeout performed, Correct Patient, Correct Site, Correct Laterality, Correct Procedure, Correct Position, site marked, Risks and benefits discussed,  Surgical consent,  Pre-op evaluation,  At surgeon's request and post-op pain management  Laterality: Left  Prep: chloraprep       Needles:  Injection technique: Single-shot  Needle Type: Echogenic Stimulator Needle          Additional Needles:  Procedures: ultrasound guided (picture in chart) and nerve stimulator Popliteal block  Nerve Stimulator or Paresthesia:  Response: plantar flexion, 0.45 mA,   Additional Responses:   Narrative:  Start time: 05/27/2014 7:12 AM End time: 05/27/2014 7:22 AM  Performed by: Personally  Anesthesiologist: J. Adonis Hugueninan Evangelia Whitaker, MD  Additional Notes: A functioning IV was confirmed and monitors were applied.  Sterile prep and drape, hand hygiene and sterile gloves were used.  Negative aspiration and test dose prior to incremental administration of local anesthetic. The patient tolerated the procedure well.Ultrasound  guidance: relevant anatomy identified, needle position confirmed, local anesthetic spread visualized around nerve(s), vascular puncture avoided.  Image printed for medical record.    Procedure Name: LMA Insertion Date/Time: 05/27/2014 7:42 AM Performed by: BLOCKER, TIMOTHY Pre-anesthesia Checklist: Patient identified, Emergency Drugs available, Suction available and Patient being monitored Patient Re-evaluated:Patient Re-evaluated prior to inductionOxygen Delivery Method: Circle System Utilized Preoxygenation: Pre-oxygenation with 100% oxygen Intubation Type: IV induction Ventilation: Mask ventilation without difficulty LMA: LMA inserted LMA Size: 4.0 Number of attempts: 1 Airway Equipment and Method: bite block Placement Confirmation: positive ETCO2 Tube secured with: Tape Dental Injury: Teeth and Oropharynx  as per pre-operative assessment

## 2014-05-27 NOTE — Anesthesia Postprocedure Evaluation (Signed)
Anesthesia Post Note  Patient: Julia Palmer  Procedure(s) Performed: Procedure(s) (LRB): Left First Metatarsal Scarf Osteotomy, Second Metatarsal Weil, Modified Mcbride Bunionectomy, Hammertoe Correction (Left)  Anesthesia type: general  Patient location: PACU  Post pain: Pain level controlled  Post assessment: Patient's Cardiovascular Status Stable  Last Vitals:  Filed Vitals:   05/27/14 1000  BP: 131/65  Pulse: 83  Temp:   Resp: 13    Post vital signs: Reviewed and stable  Level of consciousness: sedated  Complications: No apparent anesthesia complications

## 2014-05-27 NOTE — Brief Op Note (Signed)
05/27/2014  9:32 AM  PATIENT:  Genevie Cheshirearolyn Q Markert  73 y.o. female  PRE-OPERATIVE DIAGNOSIS:   1.  Left hallux valgus      2.  Left metatarsus primus varus      3.  Left 2nd hammertoe  POST-OPERATIVE DIAGNOSIS:  Same  Procedure(s): 1.  Left 1st metatarsal scarf osteotomy 2.  Left modified mcbride bunionectomy (separate incision) 3.  Left hallux akin osteotomy 4.  Left 2nd metatarsal Weil osteotomy (separate incision) 5.  Left 2nd MTPJ dorsal capsulotomy and extensor tendon lengthening 6.  Left 2nd toe hammertoe correction (separate incision) 7.  Left 2nd toe open flexor tendon release (separate incision) 8.  Left foot ap and lateral xrays  SURGEON:  Toni ArthursJohn Anjannette Gauger, MD  ASSISTANT:  Lucretia KernJacquie Allen, PA-C  ANESTHESIA:   General, regional  EBL:  minimal   TOURNIQUET:   Total Tourniquet Time Documented: Thigh (Left) - 88 minutes Total: Thigh (Left) - 88 minutes   COMPLICATIONS:  None apparent  DISPOSITION:  Extubated, awake and stable to recovery.  DICTATION ID:  [id number]

## 2014-05-27 NOTE — H&P (Signed)
Julia Palmer is an 73 y.o. female.   Chief Complaint: left foot bunion and 2nd hammertoe HPI: 73 y/o female without significant PMH c/o a painful left foot bunion and 2nd hammertoe.  She has failed non-op treatment and presents now for operative correction.  Past Medical History  Diagnosis Date  . Menopause   . Iron deficiency anemia   . Wears glasses   . Osteoporosis     Past Surgical History  Procedure Laterality Date  . Foot surgery  2014,2000    right hammertoe  . Tubal ligation    . Colonoscopy    . Ganglion cyst excision  1963    wrist-lt    Family History  Problem Relation Age of Onset  . Dementia Mother   . Stroke Father   . Cancer Sister     breast   Social History:  reports that she has never smoked. She does not have any smokeless tobacco history on file. She reports that she does not drink alcohol or use illicit drugs.  Allergies:  Allergies  Allergen Reactions  . Codeine     REACTION: Nausea, Vomitting    Medications Prior to Admission  Medication Sig Dispense Refill  . alendronate (FOSAMAX) 70 MG tablet Take 70 mg by mouth once a week. Take with a full glass of water on an empty stomach.      . Ascorbic Acid (VITAMIN C) 1000 MG tablet Take 1,000 mg by mouth daily.        . B Complex-C (SUPER B COMPLEX PO) Take by mouth daily.        . Biotin 1000 MCG tablet Take 1,000 mcg by mouth daily.        . Calcium Carbonate (CALTRATE 600 PO) Take 500 mg by mouth daily.       . Cholecalciferol (VITAMIN D-3 PO) Take 1 tablet by mouth daily.      . fish oil-omega-3 fatty acids 1000 MG capsule Take 2 g by mouth daily.        . furosemide (LASIX) 40 MG tablet Take 40 mg by mouth as needed.      . multivitamin-lutein (OCUVITE-LUTEIN) CAPS capsule Take 1 capsule by mouth daily.      . potassium chloride (K-DUR,KLOR-CON) 10 MEQ tablet take 1 tablet by mouth once daily as needed-with lasix        Results for orders placed during the hospital encounter of 05/27/14  (from the past 48 hour(s))  POCT HEMOGLOBIN-HEMACUE     Status: None   Collection Time    05/27/14  6:59 AM      Result Value Ref Range   Hemoglobin 13.5  12.0 - 15.0 g/dL   No results found.  ROS  No recent f/c/n/v/wt loss  Blood pressure 140/70, pulse 61, temperature 97.6 F (36.4 C), temperature source Oral, resp. rate 18, height 5\' 6"  (1.676 m), weight 62.506 kg (137 lb 12.8 oz), SpO2 100.00%. Physical Exam  wn wd woman in nad.  A and O x 4.  Mood and affect normal.  Eomi.  resp unlabored.  L foot with hallux valgus and 2nd hammertoe deformities.  No lymphadenopathy.   Skin healthy and intact.  2+ dp and pt pulses.  Normal sens to LT throughout the foot.  5/5 strength in PF and DF of the toes.  Assessment/Plan L hallux valgus and 2nd hammertoe - to OR for scarf osteotomy, 2nd MT weil osteotomy, modified McBride bunion correction and 2nd hammertoe correction.  The  risks and benefits of the alternative treatment options have been discussed in detail.  The patient wishes to proceed with surgery and specifically understands risks of bleeding, infection, nerve damage, blood clots, need for additional surgery, amputation and death.   Toni Arthurs 06/18/14, 7:32 AM

## 2014-05-27 NOTE — Transfer of Care (Signed)
Immediate Anesthesia Transfer of Care Note  Patient: Julia Palmer  Procedure(s) Performed: Procedure(s): Left First Metatarsal Scarf Osteotomy, Second Metatarsal Weil, Modified Mcbride Bunionectomy, Hammertoe Correction (Left)  Patient Location: PACU  Anesthesia Type:GA combined with regional for post-op pain  Level of Consciousness: awake and patient cooperative  Airway & Oxygen Therapy: Patient Spontanous Breathing and Patient connected to face mask oxygen  Post-op Assessment: Report given to PACU RN and Post -op Vital signs reviewed and stable  Post vital signs: Reviewed and stable  Complications: No apparent anesthesia complications

## 2014-05-27 NOTE — Op Note (Signed)
Julia Palmer, Julia Palmer NO.:  [id number]  MEDICAL RECORD NO.:  [id number]  LOCATION:                                 FACILITY:  PHYSICIAN:  Toni Arthurs, MD        DATE OF BIRTH:  10-08-1941  DATE OF PROCEDURE:  05/27/2014 DATE OF DISCHARGE:                              OPERATIVE REPORT   PREOPERATIVE DIAGNOSES: 1. Left hallux valgus. 2. Left metatarsus primus varus. 3. Left second hammertoe deformity.  POSTOPERATIVE DIAGNOSES: 1. Left hallux valgus. 2. Left metatarsus primus varus. 3. Left second hammertoe deformity.  PROCEDURE: 1. Left first metatarsal scarf osteotomy. 2. Left modified McBride bunionectomy through a separate incision. 3. Left hallux Akin osteotomy. 4. Left second metatarsal Weil osteotomy through a separate incision. 5. Left second MTP joint dorsal capsulotomy and extensor tendon     lengthening. 6. Left second hammertoe correction through a separate incision. 7. Left second toe and flexor tendon release through a separate     incision.  SURGEON:  Toni Arthurs, MD  ASSISTANT:  Lucretia Kern, PA-C  ANESTHESIA:  General, regional.  ESTIMATED BLOOD LOSS:  Minimal.  TOURNIQUET TIME:  88 minutes at 220 mmHg.  COMPLICATIONS:  None apparent.  DISPOSITION:  Extubated, awake, and stable to recovery.  INDICATIONS FOR PROCEDURE:  The patient is a 73 year old woman with a painful left forefoot bunion deformity as well as a painful second hammertoe deformity.  She has failed nonoperative treatment to date including activity modification, oral anti-inflammatories, shoe wear modification.  She presents now for operative treatment of this painful condition.  She understands the risks and benefits, the alternative treatment options, and elects surgical treatment.  She specifically understands risks of bleeding, infection, nerve damage, blood clots, need for additional surgery, continued pain, recurrence of the deformity, amputation, and  death.  PROCEDURE IN DETAIL:  After preoperative consent was obtained and the correct operative site was identified, the patient was brought to the operating room and placed supine on the operating table.  General anesthesia was induced.  Preoperative antibiotics were administered. Surgical time-out was taken.  Left lower extremity was prepped and draped in standard sterile fashion with tourniquet around the thigh. The extremity was exsanguinated.  The tourniquet was inflated to 220 mmHg.  A longitudinal incision was made at the dorsum of the first webspace.  Sharp dissection was carried down through the skin and subcutaneous tissue.  The intermetatarsal ligament was identified and divided under direct vision.  An arthrotomy was then made between the lateral sesamoid and first metatarsal head.  This was released proximally and distally leaving the adductor tendon intact.  Lateral joint capsule was perforated with the tip with a #15 blade several times.  The hallux could then be positioned in 20 degrees of varus passively.  Attention was then turned to the medial aspect of the forefoot where a longitudinal incision was made.  Sharp dissection was carried down through the skin and subcutaneous tissue.  The medial joint capsule was incised in line with its fibers and elevated plantarly and dorsally. The patient was noted to have extremely hypertrophic medial eminence. This was resected in line with the first metatarsal shaft using  an oscillating saw.  The cut edges were smoothed.  A 0.054 K-wire was then used to mark the corners of the scarf osteotomy of the first metatarsal shaft.  The osteotomy was made with the oscillating saw removing a small wedge of bone proximally.  The osteotomy was then mobilized with an osteotome.  The head of the metatarsal was translated laterally 50% of the width of the metatarsal shaft.  This was provisionally held with a tenaculum.  Radiographs showed  correction of the intermetatarsal angle and appropriate coverage of the sesamoids.  The osteotomy was then fixed with two 2.7-mm fully-threaded screws from the [employer] QuickFix screw set.  The overhanging bone medially was then trimmed with the oscillating saw.  At this point, attention was returned to the dorsal incision where dissection was carried over to the second MTP joint.  An arthrotomy was made, and the dorsal joint capsule was excised.  The metatarsal head was exposed.  A Weil osteotomy was then made with the oscillating saw removing a small wedge of bone distally.  The head was allowed to retract proximally several millimeters and the osteotomy was fixed with a 2-mm partially threaded cancellous screw again from the [employer] QuickFix set.  The overhanging bone was trimmed with a rongeur.  At this point, the toe was noted to have a fixed PIP flexion contracture, so the decision was made to proceed with PIP joint arthrodesis.  A transverse incision was made over the PIP joint.  Sharp dissection was carried down through the skin and subcutaneous tissue.  The collateral ligaments were released.  The plantar plate was released.  The head of the proximal phalanx and the base of the middle phalanx were resected with the oscillating saw.  The joint was then reduced and fixed with a [facility] [employer] Pro-Toe implant.  Horizontal mattress sutures of 3-0 nylon were used to then repair the skin and extensor mechanism after AP and lateral radiographs confirmed appropriate position of the joint.  At this point, the forefoot was assessed and the second toe was still noted to be dorsally displaced with contracture of the extensor tendon. The extensor digitorum longus and brevis tendons were then released and lengthened in a Z-fashion.  The toes still had a tendency to be translated slightly dorsally, so the decision was made to proceed with a flexor to extensor transfer.  An oblique incision was  made on the plantar surface of the foot from the proximal flexion crease.  Sharp dissection was carried down through the skin and subcutaneous tissue. Blunt dissection was carried down to the flexor tendon sheath.  Sheath was incised and the flexor digitorum longus tendon was isolated.  It was released from the distal phalanx with a stab incision at the distal flexion crease.  The tendon was then passed along the lateral aspect of the proximal phalanx subperiosteally and out through the dorsal incision.  It was repaired through the extensor mechanism with horizontal mattress sutures of 2-0 Vicryl.  The extensor digitorum longus and brevis tendons were then repaired with simple sutures of 2-0 Vicryl.  Passively, the toe was noted to sit in appropriate position at this time.  Attention was then returned to the hallux which was noted at this time to still have residual hallux valgus interphalangeus.  The decision was made to proceed with an Akin osteotomy.  A 0.054 K-wire was inserted across the base of the proximal phalanx distal to the subchondral bone. Radiographs confirmed appropriate position of this K-wire.  A  closing wedge osteotomy was then made at the medial aspect of the proximal phalanx removing a small wedge of bone.  The osteotomy was closed down and provisionally pinned with a 1.7 mm K-wire.  A cannulated 2.7-mm partially-threaded screw was inserted over the K-wire and was noted to compress the osteotomy site and have appropriate purchase.  Final AP and lateral radiographs confirmed appropriate position and length of all hardware and appropriate correction of the intermetatarsal and hallux valgus angles as well as the second toe hammertoe deformity.  Wounds were all irrigated copiously.  The medial joint capsule was repaired with imbricating sutures of 2-0 Vicryl.  The skin was closed with inverted simple sutures of 3-0 Monocryl in the subcutaneous layer and horizontal  mattress sutures of 3-0 nylon at the skin.  Dorsally, the incisions were closed in similar fashion.  Plantarly, horizontal mattress sutures were used to close the incision.  Sterile dressings were applied followed by a bunion wrap.  Tourniquet was released at 88 minutes.  Toes were noted to be well perfused after release of the tourniquet.  The patient was awakened from anesthesia and transported to the recovery room in stable condition.  FOLLOWUP PLAN:  The patient will be weightbearing as tolerated on her heel and a Darco style shoe.  She will follow up with me in 2 weeks in the office for suture removal.  She may need taping of the second and third toes to support them during healing.  Lucretia Kern, PA-C was present and scrubbed throughout the case.  Her assistance was essential in gaining and maintaining exposure, performing the operation, closing and dressing the wounds.  Radiographs:  AP and lateral xrays of the left foot were obtained today and show interval correction of her bunion and hammertoe deformities.  No acute fracture or dislocation is noted.  Hardware is appropriately positioned.    Toni Arthurs, MD     JH/MEDQ  D:  05/27/2014  T:  05/27/2014  Job:  [id number]

## 2014-05-27 NOTE — Anesthesia Preprocedure Evaluation (Addendum)
Anesthesia Evaluation  Patient identified by MRN, date of birth, ID band Patient awake    Reviewed: Allergy & Precautions, H&P , NPO status , Patient's Chart, lab work & pertinent test results  Airway Mallampati: II TM Distance: >3 FB Neck ROM: full    Dental  (+) Teeth Intact, Dental Advidsory Given   Pulmonary neg pulmonary ROS,  breath sounds clear to auscultation        Cardiovascular negative cardio ROS  Rhythm:regular Rate:Normal     Neuro/Psych negative neurological ROS  negative psych ROS   GI/Hepatic negative GI ROS, Neg liver ROS,   Endo/Other  negative endocrine ROS  Renal/GU negative Renal ROS     Musculoskeletal   Abdominal   Peds  Hematology  (+) anemia ,   Anesthesia Other Findings   Reproductive/Obstetrics negative OB ROS                          Anesthesia Physical Anesthesia Plan  ASA: II  Anesthesia Plan: General LMA   Post-op Pain Management: MAC Combined w/ Regional for Post-op pain   Induction:   Airway Management Planned:   Additional Equipment:   Intra-op Plan:   Post-operative Plan:   Informed Consent: I have reviewed the patients History and Physical, chart, labs and discussed the procedure including the risks, benefits and alternatives for the proposed anesthesia with the patient or authorized representative who has indicated his/her understanding and acceptance.   Dental Advisory Given  Plan Discussed with: Anesthesiologist, CRNA and Surgeon  Anesthesia Plan Comments:         Anesthesia Quick Evaluation

## 2014-05-27 NOTE — Discharge Instructions (Signed)
Julia ArthursJohn Hewitt, MD [facility]  Please read the following information regarding your care after surgery.  Medications  You only need a prescription for the narcotic pain medicine (ex. oxycodone, Percocet, Norco).  All of the other medicines listed below are available over the counter. X acetominophen (Tylenol) 650 mg every 4-6 hours as you need for minor pain X oxycodone as prescribed for moderate to severe pain  Narcotic pain medicine (ex. oxycodone, Percocet, Vicodin) will cause constipation.  To prevent this problem, take the following medicines while you are taking any pain medicine. X docusate sodium (Colace) 100 mg twice a day X senna (Senokot) 2 tablets twice a day    Weight Bearing X Bear weight only on the heel of your operated foot in the post-op shoe.   Cast / Splint / Dressing X Keep your splint or cast clean and dry.  Dont put anything (coat hanger, pencil, etc) down inside of it.  If it gets damp, use a hair dryer on the cool setting to dry it.  If it gets soaked, call the office to schedule an appointment for a cast change.  After your dressing, cast or splint is removed; you may shower, but do not soak or scrub the wound.  Allow the water to run over it, and then gently pat it dry.  Swelling It is normal for you to have swelling where you had surgery.  To reduce swelling and pain, keep your toes above your nose for at least 3 days after surgery.  It may be necessary to keep your foot or leg elevated for several weeks.  If it hurts, it should be elevated.  Follow Up Call my office at [phone] when you are discharged from the hospital or surgery center to schedule an appointment to be seen two weeks after surgery.  Call my office at [phone] if you develop a fever >101.5 F, nausea, vomiting, bleeding from the surgical site or severe pain.     Regional Anesthesia Blocks  1. Numbness or the inability to move the "blocked" extremity may last from 3-48  hours after placement. The length of time depends on the medication injected and your individual response to the medication. If the numbness is not going away after 48 hours, call your surgeon.  2. The extremity that is blocked will need to be protected until the numbness is gone and the  Strength has returned. Because you cannot feel it, you will need to take extra care to avoid injury. Because it may be weak, you may have difficulty moving it or using it. You may not know what position it is in without looking at it while the block is in effect.  3. For blocks in the legs and feet, returning to weight bearing and walking needs to be done carefully. You will need to wait until the numbness is entirely gone and the strength has returned. You should be able to move your leg and foot normally before you try and bear weight or walk. You will need someone to be with you when you first try to ensure you do not fall and possibly risk injury.  4. Bruising and tenderness at the needle site are common side effects and will resolve in a few days.  5. Persistent numbness or new problems with movement should be communicated to the surgeon or the [facility] [phone])/ [facility] [phone]).   Post Anesthesia Home Care Instructions  Activity: Get plenty of rest for the  remainder of the day. A responsible adult should stay with you for 24 hours following the procedure.  For the next 24 hours, DO NOT: -Drive a car -Paediatric nurse -Drink alcoholic beverages -Take any medication unless instructed by your physician -Make any legal decisions or sign important papers.  Meals: Start with liquid foods such as gelatin or soup. Progress to regular foods as tolerated. Avoid greasy, spicy, heavy foods. If nausea and/or vomiting occur, drink only clear liquids until the nausea and/or vomiting subsides. Call your physician if vomiting continues.  Special  Instructions/Symptoms: Your throat may feel dry or sore from the anesthesia or the breathing tube placed in your throat during surgery. If this causes discomfort, gargle with warm salt water. The discomfort should disappear within 24 hours.

## 2014-06-03 ENCOUNTER — Encounter (HOSPITAL_BASED_OUTPATIENT_CLINIC_OR_DEPARTMENT_OTHER): Payer: Self-pay | Admitting: Orthopedic Surgery

## 2014-06-08 ENCOUNTER — Encounter (HOSPITAL_BASED_OUTPATIENT_CLINIC_OR_DEPARTMENT_OTHER): Payer: Self-pay | Admitting: Orthopedic Surgery

## 2014-07-30 ENCOUNTER — Encounter: Payer: Self-pay | Admitting: Physician Assistant

## 2014-07-30 ENCOUNTER — Ambulatory Visit (INDEPENDENT_AMBULATORY_CARE_PROVIDER_SITE_OTHER): Payer: Medicare Other | Admitting: Physician Assistant

## 2014-07-30 VITALS — BP 102/70 | HR 60 | Temp 99.7°F | Resp 18 | Wt 135.0 lb

## 2014-07-30 DIAGNOSIS — J069 Acute upper respiratory infection, unspecified: Secondary | ICD-10-CM

## 2014-07-30 NOTE — Patient Instructions (Addendum)
Plain Over the Counter Mucinex (NOT Mucinex D) for thick secretions  Sore throat lozenges and saltwater gargles for sore throat symptoms.  Force NON dairy fluids, drinking plenty of water is best.    Over the Counter Flonase OR Nasacort AQ 1 spray in each nostril twice a day as needed. Use the "crossover" technique into opposite nostril spraying toward opposite ear @ 45 degree angle, not straight up into nostril.   Plain Over the Counter Allegra (NOT D )  160 daily , OR Loratidine 10 mg , OR Zyrtec 10 mg @ bedtime  as needed for itchy eyes & sneezing.  Saline Irrigation and Saline Sprays can also help reduce symptoms.  Tylenol regimen to help with muscle aches.  If emergency symptoms discussed during visit developed, seek medical attention immediately.  Followup as needed, or for worsening or persistent symptoms despite treatment.    Sore Throat A sore throat is a painful, burning, sore, or scratchy feeling of the throat. There may be pain or tenderness when swallowing or talking. You may have other symptoms with a sore throat. These include coughing, sneezing, fever, or a swollen neck. A sore throat is often the first sign of another sickness. These sicknesses may include a cold, flu, strep throat, or an infection called mono. Most sore throats go away without medical treatment.  HOME CARE   Only take medicine as told by your doctor.  Drink enough fluids to keep your pee (urine) clear or pale yellow.  Rest as needed.  Try using throat sprays, lozenges, or suck on hard candy (if older than 4 years or as told).  Sip warm liquids, such as broth, herbal tea, or warm water with honey. Try sucking on frozen ice pops or drinking cold liquids.  Rinse the mouth (gargle) with salt water. Mix 1 teaspoon salt with 8 ounces of water.  Do not smoke. Avoid being around others when they are smoking.  Put a humidifier in your bedroom at night to moisten the air. You can also turn on a hot  shower and sit in the bathroom for 5-10 minutes. Be sure the bathroom door is closed. GET HELP RIGHT AWAY IF:   You have trouble breathing.  You cannot swallow fluids, soft foods, or your spit (saliva).  You have more puffiness (swelling) in the throat.  Your sore throat does not get better in 7 days.  You feel sick to your stomach (nauseous) and throw up (vomit).  You have a fever or lasting symptoms for more than 2-3 days.  You have a fever and your symptoms suddenly get worse. MAKE SURE YOU:   Understand these instructions.  Will watch your condition.  Will get help right away if you are not doing well or get worse. Document Released: 07/10/2008 Document Revised: 06/25/2012 Document Reviewed: 06/08/2012 [facility] Patient Information 2015 [place], [place]. This information is not intended to replace advice given to you by your health care provider. Make sure you discuss any questions you have with your health care provider. Upper Respiratory Infection, Adult An upper respiratory infection (URI) is also known as the common cold. It is often caused by a type of germ (virus). Colds are easily spread (contagious). You can pass it to others by kissing, coughing, sneezing, or drinking out of the same glass. Usually, you get better in 1 or 2 weeks.  HOME CARE   Only take medicine as told by your doctor.  Use a warm mist humidifier or breathe in steam from a  hot shower.  Drink enough water and fluids to keep your pee (urine) clear or pale yellow.  Get plenty of rest.  Return to work when your temperature is back to normal or as told by your doctor. You may use a face mask and wash your hands to stop your cold from spreading. GET HELP RIGHT AWAY IF:   After the first few days, you feel you are getting worse.  You have questions about your medicine.  You have chills, shortness of breath, or brown or red spit (mucus).  You have yellow or brown snot (nasal discharge) or pain in the  face, especially when you bend forward.  You have a fever, puffy (swollen) neck, pain when you swallow, or white spots in the back of your throat.  You have a bad headache, ear pain, sinus pain, or chest pain.  You have a high-pitched whistling sound when you breathe in and out (wheezing).  You have a lasting cough or cough up blood.  You have sore muscles or a stiff neck. MAKE SURE YOU:   Understand these instructions.  Will watch your condition.  Will get help right away if you are not doing well or get worse. Document Released: 03/19/2008 Document Revised: 12/24/2011 Document Reviewed: 01/06/2014 [facility] Patient Information 2015 [place]Care, [place]LLC. This information is not intended to replace advice given to you by your health care provider. Make sure you discuss any questions you have with your health care provider.

## 2014-07-30 NOTE — Progress Notes (Signed)
Subjective:    Patient ID: Julia Palmer, female    DOB: 23-Apr-1941, 73 y.o.   MRN: [id number]  URI  This is a new problem. The current episode started yesterday. The problem has been gradually worsening. Associated symptoms include congestion, coughing, ear pain, a plugged ear sensation and a sore throat (hoarse). Pertinent negatives include no abdominal pain, chest pain, diarrhea, dysuria, headaches, joint pain, joint swelling, nausea, neck pain, rash, rhinorrhea, sinus pain, sneezing, swollen glands, vomiting or wheezing. Treatments tried: sinus rinse, mucinex, multi symptom cold medicine, tylenol, zinc lozenges. The treatment provided no relief.      Review of Systems  Constitutional: Positive for chills. Negative for fever.  HENT: Positive for congestion, ear pain and sore throat (hoarse). Negative for rhinorrhea, sinus pressure and sneezing.   Respiratory: Positive for cough. Negative for shortness of breath and wheezing.   Cardiovascular: Negative for chest pain.  Gastrointestinal: Negative for nausea, vomiting, abdominal pain and diarrhea.  Genitourinary: Negative for dysuria.  Musculoskeletal: Negative for joint pain and neck pain.  Skin: Negative for rash.  Neurological: Negative for syncope and headaches.  All other systems reviewed and are negative.  Past Medical History  Diagnosis Date  . Menopause   . Iron deficiency anemia   . Wears glasses   . Osteoporosis     History   Social History  . Marital Status: Married    Spouse Name: N/A    Number of Children: 1  . Years of Education: N/A   Occupational History  . [employer]   Social History Main Topics  . Smoking status: Never Smoker   . Smokeless tobacco: Not on file  . Alcohol Use: No  . Drug Use: No  . Sexual Activity: Yes   Other Topics Concern  . Not on file   Social History Narrative  . No narrative on file    Past Surgical History  Procedure Laterality Date  . Foot surgery   2014,2000    right hammertoe  . Tubal ligation    . Colonoscopy    . Ganglion cyst excision  1963    wrist-lt  . Bunionectomy with weil osteotomy Left 05/27/2014    Procedure: Left First Metatarsal Scarf Osteotomy, Second Metatarsal Weil, Modified Mcbride Bunionectomy, Hammertoe Correction;  Surgeon: Toni Arthurs, MD;  Location: [facility];  Service: Orthopedics;  Laterality: Left;    Family History  Problem Relation Age of Onset  . Dementia Mother   . Stroke Father   . Cancer Sister     breast    Allergies  Allergen Reactions  . Codeine     REACTION: Nausea, Vomitting    Current Outpatient Prescriptions on File Prior to Visit  Medication Sig Dispense Refill  . alendronate (FOSAMAX) 70 MG tablet Take 70 mg by mouth once a week. Take with a full glass of water on an empty stomach.      . Ascorbic Acid (VITAMIN C) 1000 MG tablet Take 1,000 mg by mouth daily.        . B Complex-C (SUPER B COMPLEX PO) Take by mouth daily.        . Biotin 1000 MCG tablet Take 1,000 mcg by mouth daily.        . Calcium Carbonate (CALTRATE 600 PO) Take 500 mg by mouth daily.       . Cholecalciferol (VITAMIN D-3 PO) Take 1 tablet by mouth daily.      . fish oil-omega-3 fatty acids  1000 MG capsule Take 2 g by mouth daily.        . furosemide (LASIX) 40 MG tablet Take 40 mg by mouth as needed.      . multivitamin-lutein (OCUVITE-LUTEIN) CAPS capsule Take 1 capsule by mouth daily.      . potassium chloride (K-DUR,KLOR-CON) 10 MEQ tablet take 1 tablet by mouth once daily as needed-with lasix      . promethazine (PHENERGAN) 12.5 MG tablet Take 1 tablet (12.5 mg total) by mouth every 4 (four) hours as needed for nausea or vomiting.  30 tablet  0   No current facility-administered medications on file prior to visit.    EXAM: BP 102/70  Pulse 60  Temp(Src) 99.7 F (37.6 C) (Oral)  Resp 18  Wt 135 lb (61.236 kg)     Objective:   Physical Exam  Nursing note and vitals  reviewed. Constitutional: She is oriented to person, place, and time. She appears well-developed and well-nourished. No distress.  HENT:  Head: Normocephalic and atraumatic.  Right Ear: External ear normal.  Left Ear: External ear normal.  Nose: Nose normal.  Mouth/Throat: No oropharyngeal exudate.  Oropharynx is slightly erythematous, no exudate. Bilateral TMs normal. Bilateral frontal and maxillary sinuses non-TTP.  Eyes: Conjunctivae and EOM are normal.  Neck: Normal range of motion. Neck supple.  Cardiovascular: Normal rate, regular rhythm and intact distal pulses.   Pulmonary/Chest: Effort normal and breath sounds normal. No stridor. No respiratory distress. She has no wheezes. She has no rales. She exhibits no tenderness.  Lungs CTAB.  Lymphadenopathy:    She has no cervical adenopathy.  Neurological: She is alert and oriented to person, place, and time.  Skin: Skin is warm and dry. She is not diaphoretic. No pallor.  Psychiatric: She has a normal mood and affect. Her behavior is normal. Judgment and thought content normal.     Lab Results  Component Value Date   WBC 5.3 09/07/2013   HGB 13.5 05/27/2014   HCT 36.6 09/07/2013   PLT 234.0 09/07/2013   GLUCOSE 77 09/07/2013   CHOL 161 09/07/2013   TRIG 39.0 09/07/2013   HDL 72.50 09/07/2013   LDLCALC 81 09/07/2013   ALT 44* 09/07/2013   AST 65* 09/07/2013   NA 139 09/07/2013   K 3.2* 09/07/2013   CL 104 09/07/2013   CREATININE 0.7 09/07/2013   BUN 21 09/07/2013   CO2 27 09/07/2013   TSH 2.03 09/07/2013   INR 0.97 07/11/2011   HGBA1C 5.8 01/12/2008        Assessment & Plan:  Julia Palmer was seen today for laryngitis.  Diagnoses and associated orders for this visit:  Acute upper respiratory infection Comments: Symptomatic treatment with Tylenol, sore throat lozenges, OTC Mucinex, nasal steroid, antihistamine, rest, push fluids, watchful waiting.    Return precautions provided, and patient handout on URI, sore  throat.  Plan to follow up as needed, or for worsening or persistent symptoms despite treatment.  Patient Instructions  Plain Over the Counter Mucinex (NOT Mucinex D) for thick secretions  Sore throat lozenges and saltwater gargles for sore throat symptoms.  Force NON dairy fluids, drinking plenty of water is best.    Over the Counter Flonase OR Nasacort AQ 1 spray in each nostril twice a day as needed. Use the "crossover" technique into opposite nostril spraying toward opposite ear @ 45 degree angle, not straight up into nostril.   Plain Over the Counter Allegra (NOT D )  160 daily , OR Loratidine  10 mg , OR Zyrtec 10 mg @ bedtime  as needed for itchy eyes & sneezing.  Saline Irrigation and Saline Sprays can also help reduce symptoms.  Tylenol regimen to help with muscle aches.  If emergency symptoms discussed during visit developed, seek medical attention immediately.  Followup as needed, or for worsening or persistent symptoms despite treatment.

## 2014-07-30 NOTE — Progress Notes (Signed)
Pre visit review using our clinic review tool, if applicable. No additional management support is needed unless otherwise documented below in the visit note. 

## 2014-09-01 ENCOUNTER — Other Ambulatory Visit (HOSPITAL_COMMUNITY): Payer: Self-pay | Admitting: Obstetrics & Gynecology

## 2014-09-01 DIAGNOSIS — Z1231 Encounter for screening mammogram for malignant neoplasm of breast: Secondary | ICD-10-CM

## 2014-09-08 ENCOUNTER — Encounter: Payer: Medicare Other | Admitting: Internal Medicine

## 2014-09-30 ENCOUNTER — Ambulatory Visit (HOSPITAL_COMMUNITY)
Admission: RE | Admit: 2014-09-30 | Discharge: 2014-09-30 | Disposition: A | Discharge status: Home / Self Care | Payer: Medicare Other | Source: Ambulatory Visit | Attending: Obstetrics & Gynecology | Admitting: Obstetrics & Gynecology

## 2014-09-30 DIAGNOSIS — Z1231 Encounter for screening mammogram for malignant neoplasm of breast: Secondary | ICD-10-CM | POA: Insufficient documentation

## 2014-11-15 ENCOUNTER — Encounter: Payer: Self-pay | Admitting: Family Medicine

## 2014-11-15 ENCOUNTER — Ambulatory Visit (INDEPENDENT_AMBULATORY_CARE_PROVIDER_SITE_OTHER): Payer: Medicare Other | Admitting: Family Medicine

## 2014-11-15 VITALS — BP 110/64 | Temp 98.0°F | Wt 139.0 lb

## 2014-11-15 DIAGNOSIS — R609 Edema, unspecified: Secondary | ICD-10-CM

## 2014-11-15 DIAGNOSIS — Z0000 Encounter for general adult medical examination without abnormal findings: Secondary | ICD-10-CM

## 2014-11-15 DIAGNOSIS — M810 Age-related osteoporosis without current pathological fracture: Secondary | ICD-10-CM | POA: Diagnosis not present

## 2014-11-15 DIAGNOSIS — E785 Hyperlipidemia, unspecified: Secondary | ICD-10-CM | POA: Diagnosis not present

## 2014-11-15 DIAGNOSIS — I8310 Varicose veins of unspecified lower extremity with inflammation: Secondary | ICD-10-CM

## 2014-11-15 DIAGNOSIS — D649 Anemia, unspecified: Secondary | ICD-10-CM | POA: Diagnosis not present

## 2014-11-15 DIAGNOSIS — IMO0001 Reserved for inherently not codable concepts without codable children: Secondary | ICD-10-CM

## 2014-11-15 DIAGNOSIS — Z23 Encounter for immunization: Secondary | ICD-10-CM

## 2014-11-15 DIAGNOSIS — H3530 Unspecified macular degeneration: Secondary | ICD-10-CM

## 2014-11-15 LAB — COMPREHENSIVE METABOLIC PANEL
ALBUMIN: 4.3 g/dL (ref 3.5–5.2)
ALT: 24 U/L (ref 0–35)
AST: 32 U/L (ref 0–37)
Alkaline Phosphatase: 40 U/L (ref 39–117)
BUN: 23 mg/dL (ref 6–23)
CO2: 30 meq/L (ref 19–32)
Calcium: 9.3 mg/dL (ref 8.4–10.5)
Chloride: 106 mEq/L (ref 96–112)
Creatinine, Ser: 0.74 mg/dL (ref 0.40–1.20)
GFR: 81.94 mL/min (ref >60.00)
Glucose, Bld: 80 mg/dL (ref 70–99)
Potassium: 4.2 mEq/L (ref 3.5–5.1)
Sodium: 141 mEq/L (ref 135–145)
Total Bilirubin: 0.7 mg/dL (ref 0.2–1.2)
Total Protein: 7.2 g/dL (ref 6.0–8.3)

## 2014-11-15 LAB — CBC
HCT: 39.4 % (ref 36.0–46.0)
HEMOGLOBIN: 13.4 g/dL (ref 12.0–15.0)
MCHC: 34 g/dL (ref 30.0–36.0)
MCV: 90.1 fl (ref 78.0–100.0)
Platelets: 238 10*3/uL (ref 150.0–400.0)
RBC: 4.37 Mil/uL (ref 3.87–5.11)
RDW: 13.6 % (ref 11.5–15.5)
WBC: 5.2 10*3/uL (ref 4.0–10.5)

## 2014-11-15 LAB — LIPID PANEL
CHOLESTEROL: 166 mg/dL (ref 0–200)
HDL: 78.2 mg/dL (ref >39.00)
LDL Cholesterol: 79 mg/dL (ref 0–99)
NonHDL: 87.8
Total CHOL/HDL Ratio: 2
Triglycerides: 46 mg/dL (ref 0.0–149.0)
VLDL: 9.2 mg/dL (ref 0.0–40.0)

## 2014-11-15 LAB — TSH: TSH: 3.24 u[IU]/mL (ref 0.35–4.50)

## 2014-11-15 NOTE — Progress Notes (Signed)
Julia ConchStephen Jibran Crookshanks, MD Phone: [phone]  Subjective:  Patient presents today to establish care with me as their new primary care provider and for annual physical. Patient was formerly a patient of Dr. Lovell Palmer. Chief complaint-noted.   Patient sees Julia Palmer for obgyn who does breast and pelvic exams. Osteoporosis managed by Dr. [name]. With family history of breast cancer still gets mammograms. Patient has been doing very well and exercise regularly. She has no complaints today.   ROS- Full ROS completed with pertinent positives: trace edema mostly controlled with compression stockings and every other day lasix and pertinent negatives: no chest pain, shortness of breath, dyspnea on exertion, fatigue, n/v/d/c.   The following were reviewed and entered/updated in epic: Past Medical History  Diagnosis Date  . Iron deficiency anemia   . Wears glasses   . Osteoporosis    Patient Active Problem List   Diagnosis Date Noted  . Osteoporosis 09/29/2007    Priority: Medium  . Edema 09/29/2007    Priority: Medium  . Macular degeneration 11/15/2014    Priority: Low  . Raynaud phenomenon 08/24/2011    Priority: Low  . Varicose veins of lower extremities with inflammation 08/21/2010    Priority: Low  . Chest pain 01/12/2008    Priority: Low  . Anemia 09/29/2007    Priority: Low   Past Surgical History  Procedure Laterality Date  . Foot surgery  2015,2000    bilateral  . Tubal ligation    . Colonoscopy    . Ganglion cyst excision  1963    wrist-lt  . Bunionectomy with weil osteotomy Left 05/27/2014    Procedure: Left First Metatarsal Scarf Osteotomy, Second Metatarsal Weil, Modified Mcbride Bunionectomy, Hammertoe Correction;  Surgeon: Julia ArthursJohn Hewitt, MD;  Location: [facility];  Service: Orthopedics;  Laterality: Left;    Family History  Problem Relation Age of Onset  . Dementia Julia Palmer   . Stroke Julia Palmer     early 960s, smoker  . Cancer Sister     breast    Medications-  reviewed and updated Current Outpatient Prescriptions  Medication Sig Dispense Refill  . alendronate (FOSAMAX) 70 MG tablet Take 70 mg by mouth once a week. Take with a full glass of water on an empty stomach.    . Ascorbic Acid (VITAMIN C) 1000 MG tablet Take 1,000 mg by mouth daily.      . B Complex-C (SUPER B COMPLEX PO) Take by mouth daily.      . Biotin 1000 MCG tablet Take 1,000 mcg by mouth daily.      . Cholecalciferol (VITAMIN D-3 PO) Take 1 tablet by mouth daily.    . fish oil-omega-3 fatty acids 1000 MG capsule Take 2 g by mouth daily.      . furosemide (LASIX) 40 MG tablet Take 40 mg by mouth as needed.    . multivitamin-lutein (OCUVITE-LUTEIN) CAPS capsule Take 1 capsule by mouth daily.    . potassium chloride (K-DUR,KLOR-CON) 10 MEQ tablet take 1 tablet by mouth once daily as needed-with lasix     No current facility-administered medications for this visit.    Allergies-reviewed and updated Allergies  Allergen Reactions  . Codeine     REACTION: Nausea, Vomitting    History   Social History  . Marital Status: Married    Spouse Name: N/A    Number of Children: 1  . Years of Education: N/A   Occupational History  . [employer] Other   Social History Main  Topics  . Smoking status: Never Smoker   . Smokeless tobacco: None  . Alcohol Use: No  . Drug Use: No  . Sexual Activity: Yes   Other Topics Concern  . None   Social History Narrative   Married 1964 (husband patient of Julia Palmer). 1 daughter who is an Technical sales engineer. No grandkids.       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       Hobbies: knitting, time with children, exercise    ROS--See HPI   Objective: BP 110/64 mmHg  Temp(Src) 98 F (36.7 C)  Wt 139 lb (63.05 kg) Gen: NAD, resting comfortably HEENT: Mucous membranes are moist. Oropharynx normal. Good dentition Neck: no thyromegaly, no lymphadenopathy CV: RRR no murmurs rubs or  gallops Lungs: CTAB no crackles, wheeze, rhonchi Abdomen: soft/nontender/nondistended/normal bowel sounds. No rebound or guarding. No hepatosplenomegaly Ext: trace to minimal edema Skin: warm, dry, no rash Neuro: grossly normal, moves all extremities, PERRLA  Assessment/Plan:  74 y.o. female presenting for annual physical.  Health Maintenance counseling: 1. Anticipatory guidance: Patient counseled regarding regular dental exams, wearing seatbelts, wearing sunscreen if out. Regular eye exams.  2. Risk factor reduction:  Advised patient of need for regular exercise and diet rich and fruits and vegetables to reduce risk of heart attack and stroke.  3. Immunizations/screenings/ancillary studies- prevnar today given.   Return precautions advised. Discussed 1 year CPE follow up, patient states may return in 2 years.   Fasting today. Trace edema for cmet, hx anemia for cbc, hx LDL > 100 2 years ago for tsh, lipids.  Orders Placed This Encounter  Procedures  . Pneumococcal conjugate vaccine 13-valent  . CBC    [place]  . Comprehensive metabolic panel    [place]    Order Specific Question:  Has the patient fasted?    Answer:  No  . Lipid panel    [place]    Order Specific Question:  Has the patient fasted?    Answer:  No  . TSH    [place]  we also noted slight trasaminase elevation for 4 years that has not significantly changed.

## 2014-11-15 NOTE — Patient Instructions (Signed)
Update your labs today  Prevnar (final pneumonia vaccine given)  Physical completed-keep doing a great job with your healthy habits  If your cholesterol is completely normal, may not recommend aspirin or statin. If cholesterol up slghtly and want to lower your risk without statin, we could do an aspirin 81mg  every other day

## 2015-01-31 ENCOUNTER — Encounter: Payer: Self-pay | Admitting: Family Medicine

## 2015-02-01 ENCOUNTER — Telehealth: Payer: Self-pay

## 2015-02-01 MED ORDER — POTASSIUM CHLORIDE CRYS ER 10 MEQ PO TBCR: EXTENDED_RELEASE_TABLET | ORAL | Status: DC

## 2015-02-01 MED ORDER — FUROSEMIDE 40 MG PO TABS: 40.0000 mg | ORAL_TABLET | ORAL | Status: DC | PRN

## 2015-02-01 NOTE — Telephone Encounter (Signed)
Medications filled.  

## 2015-02-01 NOTE — Telephone Encounter (Signed)
[facility] refill request for potassium chloride (K-DUR,KLOR-CON) 10 MEQ tablet

## 2015-02-01 NOTE — Telephone Encounter (Signed)
Also request for furosemide (LASIX) 40 MG tablet

## 2015-04-13 ENCOUNTER — Encounter: Payer: Self-pay | Admitting: Internal Medicine

## 2015-07-28 DIAGNOSIS — H353121 Nonexudative age-related macular degeneration, left eye, early dry stage: Secondary | ICD-10-CM | POA: Diagnosis not present

## 2015-07-28 DIAGNOSIS — H353111 Nonexudative age-related macular degeneration, right eye, early dry stage: Secondary | ICD-10-CM | POA: Diagnosis not present

## 2015-07-28 DIAGNOSIS — H353131 Nonexudative age-related macular degeneration, bilateral, early dry stage: Secondary | ICD-10-CM | POA: Diagnosis not present

## 2015-09-02 ENCOUNTER — Other Ambulatory Visit: Payer: Self-pay

## 2015-09-02 DIAGNOSIS — Z1231 Encounter for screening mammogram for malignant neoplasm of breast: Secondary | ICD-10-CM

## 2015-10-03 DIAGNOSIS — Z124 Encounter for screening for malignant neoplasm of cervix: Secondary | ICD-10-CM | POA: Diagnosis not present

## 2015-10-03 DIAGNOSIS — E7800 Pure hypercholesterolemia, unspecified: Secondary | ICD-10-CM | POA: Diagnosis not present

## 2015-10-03 DIAGNOSIS — Z01419 Encounter for gynecological examination (general) (routine) without abnormal findings: Secondary | ICD-10-CM | POA: Diagnosis not present

## 2015-10-03 DIAGNOSIS — R5383 Other fatigue: Secondary | ICD-10-CM | POA: Diagnosis not present

## 2015-10-03 DIAGNOSIS — R609 Edema, unspecified: Secondary | ICD-10-CM | POA: Diagnosis not present

## 2015-10-04 ENCOUNTER — Ambulatory Visit
Admission: RE | Admit: 2015-10-04 | Discharge: 2015-10-04 | Disposition: A | Discharge status: Home / Self Care | Payer: Medicare Other | Source: Ambulatory Visit

## 2015-10-04 DIAGNOSIS — Z1231 Encounter for screening mammogram for malignant neoplasm of breast: Secondary | ICD-10-CM

## 2016-07-25 DIAGNOSIS — H353122 Nonexudative age-related macular degeneration, left eye, intermediate dry stage: Secondary | ICD-10-CM | POA: Diagnosis not present

## 2016-07-25 DIAGNOSIS — H353112 Nonexudative age-related macular degeneration, right eye, intermediate dry stage: Secondary | ICD-10-CM | POA: Diagnosis not present

## 2016-08-29 ENCOUNTER — Other Ambulatory Visit: Payer: Self-pay | Admitting: Obstetrics & Gynecology

## 2016-08-29 DIAGNOSIS — Z1231 Encounter for screening mammogram for malignant neoplasm of breast: Secondary | ICD-10-CM

## 2016-08-29 DIAGNOSIS — E2839 Other primary ovarian failure: Secondary | ICD-10-CM

## 2016-09-10 ENCOUNTER — Ambulatory Visit
Admission: RE | Admit: 2016-09-10 | Discharge: 2016-09-10 | Disposition: A | Discharge status: Home / Self Care | Payer: Medicare Other | Source: Ambulatory Visit | Attending: Obstetrics & Gynecology | Admitting: Obstetrics & Gynecology

## 2016-09-10 DIAGNOSIS — E2839 Other primary ovarian failure: Secondary | ICD-10-CM

## 2016-09-10 DIAGNOSIS — M85851 Other specified disorders of bone density and structure, right thigh: Secondary | ICD-10-CM | POA: Diagnosis not present

## 2016-09-10 DIAGNOSIS — Z780 Asymptomatic menopausal state: Secondary | ICD-10-CM | POA: Diagnosis not present

## 2016-09-27 ENCOUNTER — Other Ambulatory Visit (INDEPENDENT_AMBULATORY_CARE_PROVIDER_SITE_OTHER): Payer: Medicare Other

## 2016-09-27 DIAGNOSIS — E7800 Pure hypercholesterolemia, unspecified: Secondary | ICD-10-CM

## 2016-09-27 LAB — LIPID PANEL
CHOL/HDL RATIO: 2
Cholesterol: 176 mg/dL (ref 0–200)
HDL: 82.1 mg/dL (ref >39.00)
LDL CALC: 86 mg/dL (ref 0–99)
NonHDL: 93.69
TRIGLYCERIDES: 38 mg/dL (ref 0.0–149.0)
VLDL: 7.6 mg/dL (ref 0.0–40.0)

## 2016-09-27 LAB — BASIC METABOLIC PANEL
BUN: 22 mg/dL (ref 6–23)
CHLORIDE: 105 meq/L (ref 96–112)
CO2: 32 mEq/L (ref 19–32)
CREATININE: 0.78 mg/dL (ref 0.40–1.20)
Calcium: 9.2 mg/dL (ref 8.4–10.5)
GFR: 76.72 mL/min (ref >60.00)
Glucose, Bld: 77 mg/dL (ref 70–99)
Potassium: 3.8 mEq/L (ref 3.5–5.1)
SODIUM: 142 meq/L (ref 135–145)

## 2016-09-27 LAB — HEPATIC FUNCTION PANEL
ALK PHOS: 38 U/L — AB (ref 39–117)
ALT: 36 U/L — AB (ref 0–35)
AST: 43 U/L — ABNORMAL HIGH (ref 0–37)
Albumin: 4.3 g/dL (ref 3.5–5.2)
BILIRUBIN DIRECT: 0.2 mg/dL (ref 0.0–0.3)
BILIRUBIN TOTAL: 0.9 mg/dL (ref 0.2–1.2)
Total Protein: 6.9 g/dL (ref 6.0–8.3)

## 2016-09-28 LAB — CBC WITH DIFFERENTIAL/PLATELET
BASOS ABS: 0.1 10*3/uL (ref 0.0–0.1)
Basophils Relative: 1.1 % (ref 0.0–3.0)
EOS ABS: 0.4 10*3/uL (ref 0.0–0.7)
Eosinophils Relative: 8.8 % — ABNORMAL HIGH (ref 0.0–5.0)
HEMATOCRIT: 38.5 % (ref 36.0–46.0)
Hemoglobin: 13.1 g/dL (ref 12.0–15.0)
LYMPHS ABS: 0.9 10*3/uL (ref 0.7–4.0)
LYMPHS PCT: 18.5 % (ref 12.0–46.0)
MCHC: 34.1 g/dL (ref 30.0–36.0)
MCV: 91.7 fl (ref 78.0–100.0)
Monocytes Absolute: 0.2 10*3/uL (ref 0.1–1.0)
Monocytes Relative: 4.4 % (ref 3.0–12.0)
NEUTROS ABS: 3.4 10*3/uL (ref 1.4–7.7)
NEUTROS PCT: 67.2 % (ref 43.0–77.0)
PLATELETS: 225 10*3/uL (ref 150.0–400.0)
RBC: 4.19 Mil/uL (ref 3.87–5.11)
RDW: 13.8 % (ref 11.5–15.5)
WBC: 5 10*3/uL (ref 4.0–10.5)

## 2016-10-04 ENCOUNTER — Ambulatory Visit (INDEPENDENT_AMBULATORY_CARE_PROVIDER_SITE_OTHER): Payer: Medicare Other | Admitting: Family Medicine

## 2016-10-04 ENCOUNTER — Encounter: Payer: Self-pay | Admitting: Family Medicine

## 2016-10-04 VITALS — BP 112/80 | HR 60 | Temp 97.6°F | Ht 64.0 in | Wt 148.4 lb

## 2016-10-04 DIAGNOSIS — Z0000 Encounter for general adult medical examination without abnormal findings: Secondary | ICD-10-CM | POA: Diagnosis not present

## 2016-10-04 NOTE — Progress Notes (Signed)
Phone: [phone]  Subjective:  Patient presents today for their annual physical. Chief complaint-noted.   See problem oriented charting- ROS- full  review of systems was completed and negative except for: various joint aches from day to day, intermittent swelling  The following were reviewed and entered/updated in epic: Past Medical History:  Diagnosis Date  . Iron deficiency anemia   . Osteoporosis   . Wears glasses    Patient Active Problem List   Diagnosis Date Noted  . Osteoporosis 09/29/2007    Priority: Medium  . Edema 09/29/2007    Priority: Medium  . Macular degeneration 11/15/2014    Priority: Low  . Raynaud phenomenon 08/24/2011    Priority: Low  . Varicose veins of lower extremities with inflammation 08/21/2010    Priority: Low  . Chest pain 01/12/2008    Priority: Low  . Anemia 09/29/2007    Priority: Low   Past Surgical History:  Procedure Laterality Date  . BUNIONECTOMY WITH WEIL OSTEOTOMY Left 05/27/2014   Procedure: Left First Metatarsal Scarf Osteotomy, Second Metatarsal Weil, Modified Mcbride Bunionectomy, Hammertoe Correction;  Surgeon: Toni ArthursJohn Hewitt, MD;  Location: [facility];  Service: Orthopedics;  Laterality: Left;  . COLONOSCOPY    . FOOT SURGERY  2015,2000   bilateral  . GANGLION CYST EXCISION  1963   wrist-lt  . TUBAL LIGATION      Family History  Problem Relation Age of Onset  . Dementia Mother   . Stroke Father     early 7060s, smoker  . Cancer Sister     breast    Medications- reviewed and updated Current Outpatient Prescriptions  Medication Sig Dispense Refill  . alendronate (FOSAMAX) 70 MG tablet Take 70 mg by mouth once a week. Take with a full glass of water on an empty stomach.    . Ascorbic Acid (VITAMIN C) 1000 MG tablet Take 1,000 mg by mouth daily.      . B Complex-C (SUPER B COMPLEX PO) Take by mouth daily.      . Biotin 1000 MCG tablet Take 1,000 mcg by mouth daily.      . Cholecalciferol (VITAMIN D-3  PO) Take 1 tablet by mouth daily.    . fish oil-omega-3 fatty acids 1000 MG capsule Take 2 g by mouth daily.      . furosemide (LASIX) 40 MG tablet Take 1 tablet (40 mg total) by mouth as needed. 30 tablet 4  . Multiple Vitamins-Minerals (PRESERVISION AREDS PO) Take 1 tablet by mouth 2 (two) times daily.    . potassium chloride (K-DUR,KLOR-CON) 10 MEQ tablet take 1 tablet by mouth once daily as needed-with lasix 30 tablet 4   No current facility-administered medications for this visit.     Allergies-reviewed and updated Allergies  Allergen Reactions  . Codeine     REACTION: Nausea, Vomitting    Social History   Social History  . Marital status: Married    Spouse name: N/A  . Number of children: 1  . Years of education: N/A   Occupational History  . [employer] Agent Other   Social History Main Topics  . Smoking status: Never Smoker  . Smokeless tobacco: None  . Alcohol use No  . Drug use: No  . Sexual activity: Yes   Other Topics Concern  . None   Social History Narrative   Married 1964 (husband patient of Dr. Durene CalHunter). 1 daughter who is an Technical sales engineerarchitect. No grandkids.       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       Hobbies: knitting, time with children, exercise    Objective: BP 112/80 (BP Location: Left Arm, Patient Position: Sitting, Cuff Size: Normal)   Pulse 60   Temp 97.6 F (36.4 C) (Oral)   Ht 5\' 4"  (1.626 m)   Wt 148 lb 6.4 oz (67.3 kg)   BMI 25.47 kg/m  Gen: NAD, resting comfortably HEENT: Mucous membranes are moist. Oropharynx normal Neck: no thyromegaly CV: RRR no murmurs rubs or gallops Lungs: CTAB no crackles, wheeze, rhonchi Abdomen: soft/nontender/nondistended/normal bowel sounds. No rebound or guarding. No hepatosplenomegaly  Ext: no edema Skin: warm, dry, 2+ PT pulses Neuro: grossly normal, moves all extremities, PERRLA  Assessment/Plan:  75 y.o. female presenting for annual physical.    Health Maintenance counseling: 1. Anticipatory guidance: Patient counseled regarding regular dental exams, eye exams, wearing seatbelts.  2. Risk factor reduction:  Advised patient of need for regular exercise and diet rich and fruits and vegetables to reduce risk of heart attack and stroke. Spinning 3 days a wee, leads exercise group at her church for 14 years.  3. Immunizations/screenings/ancillary studies Immunization History  Administered Date(s) Administered  . Influenza Split 07/31/2012  . Influenza Whole 07/23/2008, 08/12/2009, 08/15/2010  . Influenza, High Dose Seasonal PF 08/03/2013  . Influenza-Unspecified 07/11/2014, 07/21/2015, 07/07/2016  . Pneumococcal Conjugate-13 11/15/2014  . Pneumococcal Polysaccharide-23 08/21/2010  . Td 09/15/2007  . Zoster 08/24/2011  4. Cervical cancer screening- passed age based screening. Still going to Dr. Aldona BarWein yearly.  5. Breast cancer screening-  breast exam offered and mammogram 10/04/15- scheduled for 28th of December.  6. Colon cancer screening - 07/12/09 with 10 year follow up  Status of chronic or acute concerns  Osteoporosis- on fosamax. Bone density in November- being managed by Dr. Aldona BarWein.   S: Intermittent nose bleeds- spot in her nose that bothers her -  O: we looked and appears just has friable vessels A/p: no major concerns  Mild lft elevation- discussed 6 week follow up, prefers to do at CPE since so mild elevations. Does not drink alcohol  Declines awv  HLD in 2013- none since then but will continue to monitor labs, looks great today  Venous insufficiency- prn lasix usually about twice a week  Meds ordered this encounter  Medications  . Multiple Vitamins-Minerals (PRESERVISION AREDS PO)    Sig: Take 1 tablet by mouth 2 (two) times daily.    Return precautions advised.   Tana ConchStephen Adebayo Ensminger, MD

## 2016-10-04 NOTE — Progress Notes (Signed)
Pre visit review using our clinic review tool, if applicable. No additional management support is needed unless otherwise documented below in the visit note. 

## 2016-10-04 NOTE — Patient Instructions (Signed)
You are doing fantastic. Keep up the exercise.   Liver function tests just a hair high- want to repeat yearly at least so come back for physical in 1 year and we will check again.   Have a Altamese CabalMerry Christmas!

## 2016-10-11 ENCOUNTER — Ambulatory Visit
Admission: RE | Admit: 2016-10-11 | Discharge: 2016-10-11 | Disposition: A | Discharge status: Home / Self Care | Payer: Medicare Other | Source: Ambulatory Visit | Attending: Obstetrics & Gynecology | Admitting: Obstetrics & Gynecology

## 2016-10-11 DIAGNOSIS — Z1231 Encounter for screening mammogram for malignant neoplasm of breast: Secondary | ICD-10-CM | POA: Diagnosis not present

## 2017-04-18 ENCOUNTER — Encounter: Payer: Self-pay | Admitting: Family Medicine

## 2017-04-18 ENCOUNTER — Ambulatory Visit (INDEPENDENT_AMBULATORY_CARE_PROVIDER_SITE_OTHER): Payer: Medicare Other | Admitting: Family Medicine

## 2017-04-18 VITALS — BP 110/80 | HR 65 | Temp 97.7°F | Ht 64.0 in | Wt 150.2 lb

## 2017-04-18 DIAGNOSIS — T63441A Toxic effect of venom of bees, accidental (unintentional), initial encounter: Secondary | ICD-10-CM | POA: Diagnosis not present

## 2017-04-18 NOTE — Progress Notes (Signed)
HPI:  Acute visit for bee sting: -stung on r hand by wasp a few days ago -developed swelling/redness and prurtis in hand and wrist -has been treating with cortisone top cr and benadryl - much better today with reduced redness and swelling -no fever, malaise, difficulty breathing, swelling of throat or face, GI symptoms  ROS: See pertinent positives and negatives per HPI.  Past Medical History:  Diagnosis Date  . Iron deficiency anemia   . Osteoporosis   . Wears glasses     Past Surgical History:  Procedure Laterality Date  . BUNIONECTOMY WITH WEIL OSTEOTOMY Left 05/27/2014   Procedure: Left First Metatarsal Scarf Osteotomy, Second Metatarsal Weil, Modified Mcbride Bunionectomy, Hammertoe Correction;  Surgeon: Toni Arthurs, MD;  Location: [facility];  Service: Orthopedics;  Laterality: Left;  . COLONOSCOPY    . FOOT SURGERY  2015,2000   bilateral  . GANGLION CYST EXCISION  1963   wrist-lt  . TUBAL LIGATION      Family History  Problem Relation Age of Onset  . Dementia Mother   . Stroke Father        early 53s, smoker  . Cancer Sister        breast    Social History   Social History  . Marital status: Married    Spouse name: N/A  . Number of children: 1  . Years of education: N/A   Occupational History  . [employer]   Social History Main Topics  . Smoking status: Never Smoker  . Smokeless tobacco: Never Used  . Alcohol use No  . Drug use: No  . Sexual activity: Yes   Other Topics Concern  . None   Social History Narrative   Married 1964 (husband patient of Dr. Durene Cal). 1 daughter who is an Technical sales engineer. No grandkids.       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       Hobbies: knitting, time with children, exercise     Current Outpatient Prescriptions:  .  alendronate (FOSAMAX) 70 MG tablet, Take 70 mg by mouth once a week. Take with a full glass of water on an empty stomach.,  Disp: , Rfl:  .  Ascorbic Acid (VITAMIN C) 1000 MG tablet, Take 1,000 mg by mouth daily.  , Disp: , Rfl:  .  B Complex-C (SUPER B COMPLEX PO), Take by mouth daily.  , Disp: , Rfl:  .  Biotin 1000 MCG tablet, Take 1,000 mcg by mouth daily.  , Disp: , Rfl:  .  Cholecalciferol (VITAMIN D-3 PO), Take 1 tablet by mouth daily., Disp: , Rfl:  .  fish oil-omega-3 fatty acids 1000 MG capsule, Take 2 g by mouth daily.  , Disp: , Rfl:  .  furosemide (LASIX) 40 MG tablet, Take 1 tablet (40 mg total) by mouth as needed., Disp: 30 tablet, Rfl: 4 .  Multiple Vitamins-Minerals (PRESERVISION AREDS PO), Take 1 tablet by mouth 2 (two) times daily., Disp: , Rfl:  .  NON FORMULARY, PABA, Disp: , Rfl:  .  potassium chloride (K-DUR,KLOR-CON) 10 MEQ tablet, take 1 tablet by mouth once daily as needed-with lasix, Disp: 30 tablet, Rfl: 4  EXAM:  Vitals:   04/18/17 1609  BP: 110/80  Pulse: 65  Temp: 97.7 F (36.5 C)    Body mass index is 25.78 kg/m.  GENERAL: vitals reviewed and listed above, alert, oriented, appears well hydrated and in no acute distress  HEENT: atraumatic,  conjunttiva clear, no obvious abnormalities on inspection of external nose and ears  NECK: no obvious masses on inspection  LUNGS: clear to auscultation bilaterally, no wheezes, rales or rhonchi, good air movement  CV: HRRR, no peripheral edema  SKIN: mild erythema and edema R hand/distal forearm  MS: moves all extremities without noticeable abnormality  PSYCH: pleasant and cooperative, no obvious depression or anxiety  ASSESSMENT AND PLAN:  Discussed the following assessment and plan:  Bee sting, accidental or unintentional, initial encounter  Local reaction to bee sting, accidental or unintentional, initial encounter  -seems to be doing well and symptoms resolving -advised continued antihistamine (pepcid or zyrtec) and topical hydrocortisone  -decelind avs -Patient advised to return or notify a doctor immediately if  symptoms worsen or persist or new concerns arise.  There are no Patient Instructions on file for this visit.  Kriste BasqueKIM, Chauna Osoria R., DO

## 2017-07-25 DIAGNOSIS — H35363 Drusen (degenerative) of macula, bilateral: Secondary | ICD-10-CM | POA: Diagnosis not present

## 2017-07-25 DIAGNOSIS — H3530 Unspecified macular degeneration: Secondary | ICD-10-CM | POA: Diagnosis not present

## 2017-08-06 ENCOUNTER — Encounter: Payer: Self-pay | Admitting: Family Medicine

## 2017-08-06 ENCOUNTER — Other Ambulatory Visit (INDEPENDENT_AMBULATORY_CARE_PROVIDER_SITE_OTHER): Payer: Medicare Other

## 2017-08-06 ENCOUNTER — Ambulatory Visit (INDEPENDENT_AMBULATORY_CARE_PROVIDER_SITE_OTHER): Payer: Medicare Other | Admitting: Family Medicine

## 2017-08-06 VITALS — BP 138/72 | Temp 97.8°F | Ht 64.0 in | Wt 142.8 lb

## 2017-08-06 DIAGNOSIS — E785 Hyperlipidemia, unspecified: Secondary | ICD-10-CM | POA: Diagnosis not present

## 2017-08-06 DIAGNOSIS — R7989 Other specified abnormal findings of blood chemistry: Secondary | ICD-10-CM

## 2017-08-06 DIAGNOSIS — Z23 Encounter for immunization: Secondary | ICD-10-CM | POA: Diagnosis not present

## 2017-08-06 DIAGNOSIS — R609 Edema, unspecified: Secondary | ICD-10-CM | POA: Diagnosis not present

## 2017-08-06 DIAGNOSIS — M810 Age-related osteoporosis without current pathological fracture: Secondary | ICD-10-CM

## 2017-08-06 DIAGNOSIS — D649 Anemia, unspecified: Secondary | ICD-10-CM

## 2017-08-06 DIAGNOSIS — Z0000 Encounter for general adult medical examination without abnormal findings: Secondary | ICD-10-CM

## 2017-08-06 DIAGNOSIS — R945 Abnormal results of liver function studies: Secondary | ICD-10-CM

## 2017-08-06 DIAGNOSIS — M549 Dorsalgia, unspecified: Secondary | ICD-10-CM

## 2017-08-06 LAB — COMPREHENSIVE METABOLIC PANEL
ALBUMIN: 4.5 g/dL (ref 3.5–5.2)
ALK PHOS: 31 U/L — AB (ref 39–117)
ALT: 23 U/L (ref 0–35)
AST: 30 U/L (ref 0–37)
BILIRUBIN TOTAL: 1.1 mg/dL (ref 0.2–1.2)
BUN: 21 mg/dL (ref 6–23)
CALCIUM: 9.5 mg/dL (ref 8.4–10.5)
CHLORIDE: 103 meq/L (ref 96–112)
CO2: 28 mEq/L (ref 19–32)
CREATININE: 0.66 mg/dL (ref 0.40–1.20)
GFR: 92.81 mL/min (ref >60.00)
Glucose, Bld: 73 mg/dL (ref 70–99)
Potassium: 3.6 mEq/L (ref 3.5–5.1)
Sodium: 140 mEq/L (ref 135–145)
TOTAL PROTEIN: 7.4 g/dL (ref 6.0–8.3)

## 2017-08-06 LAB — LIPID PANEL
CHOL/HDL RATIO: 2
CHOLESTEROL: 178 mg/dL (ref 0–200)
HDL: 82.3 mg/dL (ref >39.00)
LDL CALC: 87 mg/dL (ref 0–99)
NONHDL: 95.74
TRIGLYCERIDES: 42 mg/dL (ref 0.0–149.0)
VLDL: 8.4 mg/dL (ref 0.0–40.0)

## 2017-08-06 LAB — CBC
HCT: 42 % (ref 36.0–46.0)
Hemoglobin: 13.9 g/dL (ref 12.0–15.0)
MCHC: 33.1 g/dL (ref 30.0–36.0)
MCV: 94.1 fl (ref 78.0–100.0)
PLATELETS: 235 10*3/uL (ref 150.0–400.0)
RBC: 4.46 Mil/uL (ref 3.87–5.11)
RDW: 13.7 % (ref 11.5–15.5)
WBC: 5.2 10*3/uL (ref 4.0–10.5)

## 2017-08-06 LAB — POC URINALSYSI DIPSTICK (AUTOMATED)
Bilirubin, UA: NEGATIVE
Glucose, UA: NEGATIVE
Leukocytes, UA: NEGATIVE
Nitrite, UA: NEGATIVE
PH UA: 6 (ref 5.0–8.0)
RBC UA: NEGATIVE
UROBILINOGEN UA: 1 U/dL

## 2017-08-06 NOTE — Assessment & Plan Note (Signed)
Intermittent back pain-years of issues.  usually with gardening. Aleve helps. Discussed core strengthening she is going to try some at home- if having discomfort- can do with physical therapist.

## 2017-08-06 NOTE — Assessment & Plan Note (Signed)
Mild LFTs last year- lost weight. Does not drink alcohol. Update LFTs today

## 2017-08-06 NOTE — Assessment & Plan Note (Signed)
HLD in 2013 but has stayed out of this with healthy eating/regular exercise- will update lipids to make sure maintaining

## 2017-08-06 NOTE — Assessment & Plan Note (Signed)
Edema- lasix prn for swelling- takes about 3x a week and takes potassium on those days. States still has refills- may have been written by another doctor

## 2017-08-06 NOTE — Patient Instructions (Signed)
Try core strengthening at home perhaps 2-3 days a week to see if that helps your back.   Will send mychart message about results.

## 2017-08-06 NOTE — Assessment & Plan Note (Signed)
Osteoporosis- compliant with fosamax. Manged by Dr. Aldona BarWein.

## 2017-08-06 NOTE — Progress Notes (Signed)
Phone: [phone]  Subjective:  Patient presents today for their annual physical. Chief complaint-noted.   See problem oriented charting- ROS- full  review of systems was completed and negative except for: rhinorrhea, back pain- with working in garden- aleve helps prn, sleep trouble at times- will yell out in sleep at times.   The following were reviewed and entered/updated in epic: Past Medical History:  Diagnosis Date  . Iron deficiency anemia   . Osteoporosis   . Wears glasses    Patient Active Problem List   Diagnosis Date Noted  . Hyperlipidemia 08/06/2017    Priority: Medium  . Osteoporosis 09/29/2007    Priority: Medium  . Edema 09/29/2007    Priority: Medium  . Macular degeneration 11/15/2014    Priority: Low  . Raynaud phenomenon 08/24/2011    Priority: Low  . Varicose veins of lower extremities with inflammation 08/21/2010    Priority: Low  . Chest pain 01/12/2008    Priority: Low  . Anemia 09/29/2007    Priority: Low  . Mid back pain 08/06/2017  . LFT elevation 08/06/2017   Past Surgical History:  Procedure Laterality Date  . BUNIONECTOMY WITH WEIL OSTEOTOMY Left 05/27/2014   Procedure: Left First Metatarsal Scarf Osteotomy, Second Metatarsal Weil, Modified Mcbride Bunionectomy, Hammertoe Correction;  Surgeon: Toni Arthurs, MD;  Location: [facility];  Service: Orthopedics;  Laterality: Left;  . COLONOSCOPY    . FOOT SURGERY  2015,2000   bilateral  . GANGLION CYST EXCISION  1963   wrist-lt  . TUBAL LIGATION      Family History  Problem Relation Age of Onset  . Dementia Mother   . Stroke Father        early 4s, smoker  . Cancer Sister        breast    Medications- reviewed and updated Current Outpatient Prescriptions  Medication Sig Dispense Refill  . alendronate (FOSAMAX) 70 MG tablet Take 70 mg by mouth once a week. Take with a full glass of water on an empty stomach.    . Ascorbic Acid (VITAMIN C) 1000 MG tablet Take 1,000 mg  by mouth daily.      . B Complex-C (SUPER B COMPLEX PO) Take by mouth daily.      . Biotin 1000 MCG tablet Take 1,000 mcg by mouth daily.      . Cholecalciferol (VITAMIN D-3 PO) Take 1 tablet by mouth daily.    . COLLAGEN PO Take 1 tablet by mouth daily.    . fish oil-omega-3 fatty acids 1000 MG capsule Take 2 g by mouth daily.      . furosemide (LASIX) 40 MG tablet Take 1 tablet (40 mg total) by mouth as needed. 30 tablet 4  . Multiple Vitamins-Minerals (PRESERVISION AREDS PO) Take 1 tablet by mouth 2 (two) times daily.    . NON FORMULARY PABA    . potassium chloride (K-DUR,KLOR-CON) 10 MEQ tablet take 1 tablet by mouth once daily as needed-with lasix 30 tablet 4   No current facility-administered medications for this visit.     Allergies-reviewed and updated Allergies  Allergen Reactions  . Codeine     REACTION: Nausea, Vomitting    Social History   Social History  . Marital status: Married    Spouse name: N/A  . Number of children: 1  . Years of education: N/A   Occupational History  . [employer] Other   Social History Main Topics  . Smoking status: Never Smoker  .  Smokeless tobacco: Never Used  . Alcohol use No  . Drug use: No  . Sexual activity: Yes   Other Topics Concern  . None   Social History Narrative   Married 1964 (husband patient of Dr. Durene Cal). 1 daughter who is an Technical sales engineer. No grandkids.       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       Hobbies: knitting, time with children, exercise    Objective: BP 138/72 (BP Location: Left Arm, Patient Position: Sitting, Cuff Size: Large)   Temp 97.8 F (36.6 C) (Oral)   Ht 5\' 4"  (1.626 m)   Wt 142 lb 12.8 oz (64.8 kg)   BMI 24.51 kg/m  Gen: NAD, resting comfortably HEENT: Mucous membranes are moist. Oropharynx normal Neck: no thyromegaly CV: RRR no murmurs rubs or gallops Lungs: CTAB no crackles, wheeze, rhonchi Abdomen:  soft/nontender/nondistended/normal bowel sounds. No rebound or guarding.  Ext: no edema Skin: warm, dry Neuro: grossly normal, moves all extremities, PERRLA  Assessment/Plan:  76 y.o. female presenting for annual physical.  Health Maintenance counseling: 1. Anticipatory guidance: Patient counseled regarding regular dental exams q6 months, eye exams - yearly, wearing seatbelts.  2. Risk factor reduction:  Advised patient of need for regular exercise and diet rich and fruits and vegetables to reduce risk of heart attack and stroke. 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 Still doing spinning 3 days a week. Diet-has done even better over last year and weight down 6 lbs since last CPE.  Wt Readings from Last 3 Encounters:  08/06/17 142 lb 12.8 oz (64.8 kg)  04/18/17 150 lb 3.2 oz (68.1 kg)  10/04/16 148 lb 6.4 oz (67.3 kg)  3. Immunizations/screenings/ancillary studies- flu shot today.  Discussed shingrix at pharmacy Immunization History  Administered Date(s) Administered  . Influenza Split 07/31/2012  . Influenza Whole 07/23/2008, 08/12/2009, 08/15/2010  . Influenza, High Dose Seasonal PF 08/03/2013, 08/06/2017  . Influenza-Unspecified 07/11/2014, 07/21/2015, 08/16/2015, 07/07/2016  . Pneumococcal Conjugate-13 11/15/2014  . Pneumococcal Polysaccharide-23 08/21/2010  . Td 09/15/2007  . Zoster 08/24/2011  4. Cervical cancer screening- passed age based screening but sees Dr. Aldona Bar yearly per her prefrence 5. Breast cancer screening-  Mammogram 10/11/16- sees yearly. Breast exams with Dr. Aldona Bar 6. Colon cancer screening - 07/12/09 with 10 year follow up 7. Skin cancer screening- mainly avoids sun- advised regular sunscreen use. Denies worrisome, changing, or new skin lesions.   Status of chronic or acute concerns   Hyperlipidemia HLD in 2013 but has stayed out of this with healthy eating/regular exercise- will update lipids to make sure maintaining  Mid back  pain Intermittent back pain-years of issues.  usually with gardening. Aleve helps. Discussed core strengthening she is going to try some at home- if having discomfort- can do with physical therapist.   Edema Edema- lasix prn for swelling- takes about 3x a week and takes potassium on those days. States still has refills- may have been written by another doctor  Osteoporosis Osteoporosis- compliant with fosamax. Manged by Dr. Aldona Bar.   LFT elevation Mild LFTs last year- lost weight. Does not drink alcohol. Update LFTs today Return in about 1 year (around 08/06/2018) for physical.  Orders Placed This Encounter  Procedures  . Flu vaccine HIGH DOSE PF  . CBC    [place]  . Comprehensive metabolic panel      . Lipid panel    Standing Status:   Future    Standing  Expiration Date:   08/06/2018  . POCT Urinalysis Dipstick (Automated)    Meds ordered this encounter  Medications  . COLLAGEN PO    Sig: Take 1 tablet by mouth daily.    Return precautions advised.  Tana ConchStephen Hunter, MD

## 2017-08-09 ENCOUNTER — Other Ambulatory Visit: Payer: Self-pay | Admitting: Obstetrics & Gynecology

## 2017-08-09 DIAGNOSIS — Z1231 Encounter for screening mammogram for malignant neoplasm of breast: Secondary | ICD-10-CM

## 2018-01-01 ENCOUNTER — Telehealth: Payer: Self-pay | Admitting: Family Medicine

## 2018-01-01 NOTE — Telephone Encounter (Signed)
Pt declined AWV. °

## 2018-05-26 DIAGNOSIS — Z6822 Body mass index (BMI) 22.0-22.9, adult: Secondary | ICD-10-CM | POA: Diagnosis not present

## 2018-05-26 DIAGNOSIS — Z1231 Encounter for screening mammogram for malignant neoplasm of breast: Secondary | ICD-10-CM | POA: Diagnosis not present

## 2018-05-26 DIAGNOSIS — Z01419 Encounter for gynecological examination (general) (routine) without abnormal findings: Secondary | ICD-10-CM | POA: Diagnosis not present

## 2018-05-26 LAB — HM COLONOSCOPY

## 2018-05-26 LAB — HM MAMMOGRAPHY

## 2018-06-02 ENCOUNTER — Encounter: Payer: Self-pay | Admitting: Family Medicine

## 2018-08-01 DIAGNOSIS — H35372 Puckering of macula, left eye: Secondary | ICD-10-CM | POA: Diagnosis not present

## 2018-08-01 DIAGNOSIS — H353111 Nonexudative age-related macular degeneration, right eye, early dry stage: Secondary | ICD-10-CM | POA: Diagnosis not present

## 2018-08-08 ENCOUNTER — Encounter: Payer: Self-pay | Admitting: Family Medicine

## 2018-08-08 ENCOUNTER — Ambulatory Visit (INDEPENDENT_AMBULATORY_CARE_PROVIDER_SITE_OTHER): Payer: Medicare Other | Admitting: Family Medicine

## 2018-08-08 VITALS — BP 142/86 | Temp 97.6°F | Ht 64.0 in | Wt 141.4 lb

## 2018-08-08 DIAGNOSIS — Z23 Encounter for immunization: Secondary | ICD-10-CM | POA: Diagnosis not present

## 2018-08-08 DIAGNOSIS — Z0000 Encounter for general adult medical examination without abnormal findings: Secondary | ICD-10-CM

## 2018-08-08 DIAGNOSIS — E785 Hyperlipidemia, unspecified: Secondary | ICD-10-CM | POA: Diagnosis not present

## 2018-08-08 LAB — POC URINALSYSI DIPSTICK (AUTOMATED)
Bilirubin, UA: NEGATIVE
Blood, UA: NEGATIVE
Glucose, UA: NEGATIVE
Leukocytes, UA: NEGATIVE
Nitrite, UA: NEGATIVE
Protein, UA: NEGATIVE
Spec Grav, UA: 1.02 (ref 1.010–1.025)
Urobilinogen, UA: 0.2 E.U./dL
pH, UA: 6 (ref 5.0–8.0)

## 2018-08-08 LAB — CBC
HCT: 40.9 % (ref 36.0–46.0)
Hemoglobin: 13.7 g/dL (ref 12.0–15.0)
MCHC: 33.5 g/dL (ref 30.0–36.0)
MCV: 92 fl (ref 78.0–100.0)
Platelets: 254 10*3/uL (ref 150.0–400.0)
RBC: 4.44 Mil/uL (ref 3.87–5.11)
RDW: 13.8 % (ref 11.5–15.5)
WBC: 4.4 10*3/uL (ref 4.0–10.5)

## 2018-08-08 LAB — COMPREHENSIVE METABOLIC PANEL
ALT: 20 U/L (ref 0–35)
AST: 26 U/L (ref 0–37)
Albumin: 4.7 g/dL (ref 3.5–5.2)
Alkaline Phosphatase: 40 U/L (ref 39–117)
BUN: 14 mg/dL (ref 6–23)
CO2: 29 mEq/L (ref 19–32)
Calcium: 9.8 mg/dL (ref 8.4–10.5)
Chloride: 103 mEq/L (ref 96–112)
Creatinine, Ser: 0.65 mg/dL (ref 0.40–1.20)
GFR: 94.2 mL/min (ref >60.00)
Glucose, Bld: 85 mg/dL (ref 70–99)
Potassium: 4.7 mEq/L (ref 3.5–5.1)
Sodium: 140 mEq/L (ref 135–145)
Total Bilirubin: 0.8 mg/dL (ref 0.2–1.2)
Total Protein: 7.6 g/dL (ref 6.0–8.3)

## 2018-08-08 LAB — LIPID PANEL
Cholesterol: 189 mg/dL (ref 0–200)
HDL: 76.6 mg/dL (ref >39.00)
LDL CALC: 101 mg/dL — AB (ref 0–99)
NonHDL: 112.23
Total CHOL/HDL Ratio: 2
Triglycerides: 56 mg/dL (ref 0.0–149.0)
VLDL: 11.2 mg/dL (ref 0.0–40.0)

## 2018-08-08 NOTE — Progress Notes (Signed)
Phone: [phone]  Subjective:  Patient presents today for their annual physical. Chief complaint-noted.   See problem oriented charting- ROS- full  review of systems was completed and negative except for: back pain, sleep issues, runny nose  The following were reviewed and entered/updated in epic: Past Medical History:  Diagnosis Date  . Iron deficiency anemia   . Osteoporosis   . Wears glasses    Patient Active Problem List   Diagnosis Date Noted  . Hyperlipidemia 08/06/2017    Priority: Medium  . Osteoporosis 09/29/2007    Priority: Medium  . Edema 09/29/2007    Priority: Medium  . Macular degeneration 11/15/2014    Priority: Low  . Raynaud phenomenon 08/24/2011    Priority: Low  . Varicose veins of lower extremities with inflammation 08/21/2010    Priority: Low  . Chest pain 01/12/2008    Priority: Low  . Anemia 09/29/2007    Priority: Low  . Mid back pain 08/06/2017  . LFT elevation 08/06/2017   Past Surgical History:  Procedure Laterality Date  . BUNIONECTOMY WITH WEIL OSTEOTOMY Left 05/27/2014   Procedure: Left First Metatarsal Scarf Osteotomy, Second Metatarsal Weil, Modified Mcbride Bunionectomy, Hammertoe Correction;  Surgeon: Toni Arthurs, MD;  Location: [facility];  Service: Orthopedics;  Laterality: Left;  . COLONOSCOPY    . FOOT SURGERY  2015,2000   bilateral  . GANGLION CYST EXCISION  1963   wrist-lt  . TUBAL LIGATION      Family History  Problem Relation Age of Onset  . Dementia Mother   . Stroke Father        early 78s, smoker  . Cancer Sister        breast    Medications- reviewed and updated Current Outpatient Medications  Medication Sig Dispense Refill  . alendronate (FOSAMAX) 70 MG tablet Take 70 mg by mouth once a week. Take with a full glass of water on an empty stomach.    . Ascorbic Acid (VITAMIN C) 1000 MG tablet Take 1,000 mg by mouth daily.      . B Complex-C (SUPER B COMPLEX PO) Take by mouth daily.      .  Biotin 1000 MCG tablet Take 1,000 mcg by mouth daily.      . Cholecalciferol (VITAMIN D-3 PO) Take 1 tablet by mouth daily.    . COLLAGEN PO Take 1 tablet by mouth daily.    . fish oil-omega-3 fatty acids 1000 MG capsule Take 2 g by mouth daily.      . furosemide (LASIX) 40 MG tablet Take 1 tablet (40 mg total) by mouth as needed. 30 tablet 4  . Multiple Vitamins-Minerals (PRESERVISION AREDS PO) Take 1 tablet by mouth 2 (two) times daily.    . NON FORMULARY PABA    . potassium chloride (K-DUR,KLOR-CON) 10 MEQ tablet take 1 tablet by mouth once daily as needed-with lasix 30 tablet 4   No current facility-administered medications for this visit.     Allergies-reviewed and updated Allergies  Allergen Reactions  . Codeine     REACTION: Nausea, Vomitting    Social History   Social History Narrative   Married 1964 (husband patient of Dr. Durene Cal). 1 daughter who is an Technical sales engineer. No grandkids.       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       Hobbies: knitting, time with children, exercise    Objective: BP (!) 142/86   Temp 97.6 F (36.4 C) (  Oral)   Ht 5\' 4"  (1.626 m)   Wt 141 lb 6.4 oz (64.1 kg)   BMI 24.27 kg/m  Gen: NAD, resting comfortably HEENT: Mucous membranes are moist. Oropharynx normal Neck: no thyromegaly CV: RRR no murmurs rubs or gallops Lungs: CTAB no crackles, wheeze, rhonchi Abdomen: soft/nontender/nondistended/normal bowel sounds. No rebound or guarding.  Ext: no edema Skin: warm, dry Neuro: grossly normal, moves all extremities, PERRLA  Assessment/Plan:  77 y.o. female presenting for annual physical.  Health Maintenance counseling:- 1. Anticipatory guidance: Patient counseled regarding regular dental exams -q6 months, eye exams - some "fluid in left eye" seeing retina specialist, wearing seatbelts.  2. Risk factor reduction:  Advised patient of need for regular exercise and diet rich and fruits and  vegetables to reduce risk of heart attack and stroke. Exercise- spin class 3 days a week, had been leading exercise group at church but disbanded last year. Diet-has done a great job maintaining healthy weight with reasonable diet.  Wt Readings from Last 3 Encounters:  08/08/18 141 lb 6.4 oz (64.1 kg)  08/06/17 142 lb 12.8 oz (64.8 kg)  04/18/17 150 lb 3.2 oz (68.1 kg)  3. Immunizations/screenings/ancillary studies-flu shot today, tetanus at pharmacy advised.  Consider Shingrix at pharmacy as well Immunization History  Administered Date(s) Administered  . Influenza Split 07/31/2012  . Influenza Whole 07/23/2008, 08/12/2009, 08/15/2010  . Influenza, High Dose Seasonal PF 08/03/2013, 08/06/2017  . Influenza-Unspecified 07/11/2014, 07/21/2015, 08/16/2015, 07/07/2016  . Pneumococcal Conjugate-13 11/15/2014  . Pneumococcal Polysaccharide-23 08/21/2010  . Td 09/15/2007  . Zoster 08/24/2011  4. Cervical cancer screening- passed age based screening, she still does pelvic exams with Dr. Aldona Bar 5. Breast cancer screening-  breast exam with Dr. Aldona Bar and mammogram - with Dr. Aldona Bar 6. Colon cancer screening - will be due 2020 for 10 year surveillance 7. Skin cancer screening- no dermatologist. advised regular sunscreen use. Denies worrisome, changing, or new skin lesions.  8. Birth control/STD check- monogomous and postmenopausal 9. Osteoporosis -managed by Dr. Aldona Bar -compliant with Fosamax  Status of chronic or acute concerns   Hyperlipidemia noted 2013-continue to update lipids to make sure remains diet controlled.  Also get CBC and CMP.  Mild LFT elevations 2 years ago-normal last year  Bp trending up- home monitoring planned and follow up at wellness visit BP Readings from Last 3 Encounters:  08/08/18 (!) 142/86  08/06/17 138/72  04/18/17 110/80   Has noted mild memory issues- no family members or friends have noted- will get plugged in for yearly AWVs  Has had a few bad dreams  Continued  intermittent mid back pain.  Worse with bending/gardening- can only do 10 mins in yard now.  Aleve helps- Dr. Aldona Bar advised tylenol instead- she has been hesitant- I reinforced this advice  Pretibial edema- Lasix as needed for edema 3 times a week and takes potassium on those days.  4 to 6-week annual wellness visit to recheck blood pressure and check memory 1 year follow-up with me  Lab/Order associations: Fasting Preventative health care  Hyperlipidemia, unspecified hyperlipidemia type - Plan: CBC, Comprehensive metabolic panel, Lipid panel, POCT Urinalysis Dipstick (Automated)  Return precautions advised.  Tana Conch, MD

## 2018-08-08 NOTE — Addendum Note (Signed)
Addended by: Felix Ahmadi A on: 08/08/2018 10:29 AM   Modules accepted: Orders

## 2018-08-08 NOTE — Addendum Note (Signed)
Addended by: Rivka Safer, Asher Muir M on: 08/08/2018 10:12 AM   Modules accepted: Orders

## 2018-08-08 NOTE — Patient Instructions (Addendum)
Health Maintenance Due  Topic Date Due  . TETANUS/TDAP - I would like for you to  Consider doing this at your pharmacy (not covered here in office) 09/14/2017   . Please check with your pharmacy to see if they have the shingrix vaccine. If they do- please get this immunization and update Julia Palmer by phone call or mychart with dates you receive the vaccine     Please stop by lab before you go   I would also like for you to sign up for an annual wellness visit with one of our nurse specialists, Cassie or Darl Pikes or Dolores. This is a free benefit under medicare that may help Julia Palmer find additional ways to help you. Some highlights are reviewing medications, lifestyle, and doing a dementia screen. I would ask them specifically to do an MMSE memory test when you see them instead of the more abbreviated screens.   Your blood pressure trend concerns me. I would like for you to use a home cuff to check at least 4x a week. Your goal is <140/90. If you note in the next few weeks that it is higher than our goal, see me sooner. Otherwise, see Julia Palmer for wellness visit n 4-6 weeks. Bring your home cuff and your log of blood pressures with you to visit.   DASH Eating Plan DASH stands for "Dietary Approaches to Stop Hypertension." The DASH eating plan is a healthy eating plan that has been shown to reduce high blood pressure (hypertension). It may also reduce your risk for type 2 diabetes, heart disease, and stroke. The DASH eating plan may also help with weight loss. What are tips for following this plan? General guidelines  Avoid eating more than 2,300 mg (milligrams) of salt (sodium) a day. If you have hypertension, you may need to reduce your sodium intake to 1,500 mg a day.  Limit alcohol intake to no more than 1 drink a day for nonpregnant women and 2 drinks a day for men. One drink equals 12 oz of beer, 5 oz of wine, or 1 oz of hard liquor.  Work with your health care provider to maintain a healthy body weight or to  lose weight. Ask what an ideal weight is for you.  Get at least 30 minutes of exercise that causes your heart to beat faster (aerobic exercise) most days of the week. Activities may include walking, swimming, or biking.  Work with your health care provider or diet and nutrition specialist (dietitian) to adjust your eating plan to your individual calorie needs. Reading food labels  Check food labels for the amount of sodium per serving. Choose foods with less than 5 percent of the Daily Value of sodium. Generally, foods with less than 300 mg of sodium per serving fit into this eating plan.  To find whole grains, look for the word "whole" as the first word in the ingredient list. Shopping  Buy products labeled as "low-sodium" or "no salt added."  Buy fresh foods. Avoid canned foods and premade or frozen meals. Cooking  Avoid adding salt when cooking. Use salt-free seasonings or herbs instead of table salt or sea salt. Check with your health care provider or pharmacist before using salt substitutes.  Do not fry foods. Cook foods using healthy methods such as baking, boiling, grilling, and broiling instead.  Cook with heart-healthy oils, such as olive, canola, soybean, or sunflower oil. Meal planning   Eat a balanced diet that includes: ? 5 or more servings of fruits and  vegetables each day. At each meal, try to fill half of your plate with fruits and vegetables. ? Up to 6-8 servings of whole grains each day. ? Less than 6 oz of lean meat, poultry, or fish each day. A 3-oz serving of meat is about the same size as a deck of cards. One egg equals 1 oz. ? 2 servings of low-fat dairy each day. ? A serving of nuts, seeds, or beans 5 times each week. ? Heart-healthy fats. Healthy fats called Omega-3 fatty acids are found in foods such as flaxseeds and coldwater fish, like sardines, salmon, and mackerel.  Limit how much you eat of the following: ? Canned or prepackaged foods. ? Food that is  high in trans fat, such as fried foods. ? Food that is high in saturated fat, such as fatty meat. ? Sweets, desserts, sugary drinks, and other foods with added sugar. ? Full-fat dairy products.  Do not salt foods before eating.  Try to eat at least 2 vegetarian meals each week.  Eat more home-cooked food and less restaurant, buffet, and fast food.  When eating at a restaurant, ask that your food be prepared with less salt or no salt, if possible. What foods are recommended? The items listed may not be a complete list. Talk with your dietitian about what dietary choices are best for you. Grains Whole-grain or whole-wheat bread. Whole-grain or whole-wheat pasta. Brown rice. Orpah Cobb. Bulgur. Whole-grain and low-sodium cereals. Pita bread. Low-fat, low-sodium crackers. Whole-wheat flour tortillas. Vegetables Fresh or frozen vegetables (raw, steamed, roasted, or grilled). Low-sodium or reduced-sodium tomato and vegetable juice. Low-sodium or reduced-sodium tomato sauce and tomato paste. Low-sodium or reduced-sodium canned vegetables. Fruits All fresh, dried, or frozen fruit. Canned fruit in natural juice (without added sugar). Meat and other protein foods Skinless chicken or [place]. Ground chicken or [place]. Pork with fat trimmed off. Fish and seafood. Egg whites. Dried beans, peas, or lentils. Unsalted nuts, nut butters, and seeds. Unsalted canned beans. Lean cuts of beef with fat trimmed off. Low-sodium, lean deli meat. Dairy Low-fat (1%) or fat-free (skim) milk. Fat-free, low-fat, or reduced-fat cheeses. Nonfat, low-sodium ricotta or cottage cheese. Low-fat or nonfat yogurt. Low-fat, low-sodium cheese. Fats and oils Soft margarine without trans fats. Vegetable oil. Low-fat, reduced-fat, or light mayonnaise and salad dressings (reduced-sodium). Canola, safflower, olive, soybean, and sunflower oils. Avocado. Seasoning and other foods Herbs. Spices. Seasoning mixes without salt.  Unsalted popcorn and pretzels. Fat-free sweets. What foods are not recommended? The items listed may not be a complete list. Talk with your dietitian about what dietary choices are best for you. Grains Baked goods made with fat, such as croissants, muffins, or some breads. Dry pasta or rice meal packs. Vegetables Creamed or fried vegetables. Vegetables in a cheese sauce. Regular canned vegetables (not low-sodium or reduced-sodium). Regular canned tomato sauce and paste (not low-sodium or reduced-sodium). Regular tomato and vegetable juice (not low-sodium or reduced-sodium). Rosita Fire. Olives. Fruits Canned fruit in a light or heavy syrup. Fried fruit. Fruit in cream or butter sauce. Meat and other protein foods Fatty cuts of meat. Ribs. Fried meat. Tomasa Blase. Sausage. Bologna and other processed lunch meats. Salami. Fatback. Hotdogs. Bratwurst. Salted nuts and seeds. Canned beans with added salt. Canned or smoked fish. Whole eggs or egg yolks. Chicken or [place] with skin. Dairy Whole or 2% milk, cream, and half-and-half. Whole or full-fat cream cheese. Whole-fat or sweetened yogurt. Full-fat cheese. Nondairy creamers. Whipped toppings. Processed cheese and cheese spreads. Fats and oils  Butter. Stick margarine. Lard. Shortening. Ghee. Bacon fat. Tropical oils, such as coconut, palm kernel, or palm oil. Seasoning and other foods Salted popcorn and pretzels. Onion salt, garlic salt, seasoned salt, table salt, and sea salt. Worcestershire sauce. Tartar sauce. Barbecue sauce. Teriyaki sauce. Soy sauce, including reduced-sodium. Steak sauce. Canned and packaged gravies. Fish sauce. Oyster sauce. Cocktail sauce. Horseradish that you find on the shelf. Ketchup. Mustard. Meat flavorings and tenderizers. Bouillon cubes. Hot sauce and Tabasco sauce. Premade or packaged marinades. Premade or packaged taco seasonings. Relishes. Regular salad dressings. Where to find more information:  National Heart, Lung, and Blood  Institute: PopSteam.is  American Heart Association: www.heart.org Summary  The DASH eating plan is a healthy eating plan that has been shown to reduce high blood pressure (hypertension). It may also reduce your risk for type 2 diabetes, heart disease, and stroke.  With the DASH eating plan, you should limit salt (sodium) intake to 2,300 mg a day. If you have hypertension, you may need to reduce your sodium intake to 1,500 mg a day.  When on the DASH eating plan, aim to eat more fresh fruits and vegetables, whole grains, lean proteins, low-fat dairy, and heart-healthy fats.  Work with your health care provider or diet and nutrition specialist (dietitian) to adjust your eating plan to your individual calorie needs. This information is not intended to replace advice given to you by your health care provider. Make sure you discuss any questions you have with your health care provider. Document Released: 09/20/2011 Document Revised: 09/24/2016 Document Reviewed: 09/24/2016 Elsevier Interactive Patient Education  [employer].

## 2018-09-16 DIAGNOSIS — H353132 Nonexudative age-related macular degeneration, bilateral, intermediate dry stage: Secondary | ICD-10-CM | POA: Diagnosis not present

## 2018-09-16 DIAGNOSIS — H43813 Vitreous degeneration, bilateral: Secondary | ICD-10-CM | POA: Diagnosis not present

## 2018-09-16 DIAGNOSIS — H2513 Age-related nuclear cataract, bilateral: Secondary | ICD-10-CM | POA: Diagnosis not present

## 2018-09-23 ENCOUNTER — Ambulatory Visit (INDEPENDENT_AMBULATORY_CARE_PROVIDER_SITE_OTHER): Payer: Medicare Other

## 2018-09-23 VITALS — BP 128/86 | HR 76 | Temp 98.4°F | Ht 64.0 in | Wt 142.4 lb

## 2018-09-23 DIAGNOSIS — Z0000 Encounter for general adult medical examination without abnormal findings: Secondary | ICD-10-CM

## 2018-09-23 NOTE — Progress Notes (Signed)
PCP notes: Last OV 08/08/2018   Health maintenance:Tetanus needed. Encouraged to get at pharmacy   Abnormal screenings: MMSE score of 26. Patient became teary when discussing memory. Schedule patient with Dr. Durene CalHunter tomorrow to discuss and have labs drawn.    Patient concerns: Memory   Nurse concerns: Memory issues. Scheduled for further follow up with Dr. Durene CalHunter. Provided a copy of Medical Advanced Directive packet to both her and her husband   Next PCP appt: Schedule as Needed

## 2018-09-23 NOTE — Patient Instructions (Signed)
Ms. Lessie DingsClontz , Thank you for taking time to come for your Medicare Wellness Visit. I appreciate your ongoing commitment to your health goals. Please review the following plan we discussed and let me know if I can assist you in the future.   These are the goals we discussed: Goals    . Exercise 150 min/wk Moderate Activity     Maintain current life style of 3 times a week working out       This is a list of the screening recommended for you and due dates:  Health Maintenance  Topic Date Due  . Tetanus Vaccine  09/14/2017  . Flu Shot  Completed  . DEXA scan (bone density measurement)  Completed  . Pneumonia vaccines  Completed    Preventive Care for Adults  A healthy lifestyle and preventive care can promote health and wellness. Preventive health guidelines for adults include the following key practices.  . A routine yearly physical is a good way to check with your health care provider about your health and preventive screening. It is a chance to share any concerns and updates on your health and to receive a thorough exam.  . Visit your dentist for a routine exam and preventive care every 6 months. Brush your teeth twice a day and floss once a day. Good oral hygiene prevents tooth decay and gum disease.  . The frequency of eye exams is based on your age, health, family medical history, use  of contact lenses, and other factors. Follow your health care provider's recommendations for frequency of eye exams.  . Eat a healthy diet. Foods like vegetables, fruits, whole grains, low-fat dairy products, and lean protein foods contain the nutrients you need without too many calories. Decrease your intake of foods high in solid fats, added sugars, and salt. Eat the right amount of calories for you. Get information about a proper diet from your health care provider, if necessary.  . Regular physical exercise is one of the most important things you can do for your health. Most adults should get at  least 150 minutes of moderate-intensity exercise (any activity that increases your heart rate and causes you to sweat) each week. In addition, most adults need muscle-strengthening exercises on 2 or more days a week.  Silver Sneakers may be a benefit available to you. To determine eligibility, you may visit the website: www.silversneakers.com or contact program at 40441056171-619-522-1894 Mon-Fri between 8AM-8PM.   . Maintain a healthy weight. The body mass index (BMI) is a screening tool to identify possible weight problems. It provides an estimate of body fat based on height and weight. Your health care provider can find your BMI and can help you achieve or maintain a healthy weight.   For adults 20 years and older: ? A BMI below 18.5 is considered underweight. ? A BMI of 18.5 to 24.9 is normal. ? A BMI of 25 to 29.9 is considered overweight. ? A BMI of 30 and above is considered obese.   . Maintain normal blood lipids and cholesterol levels by exercising and minimizing your intake of saturated fat. Eat a balanced diet with plenty of fruit and vegetables. Blood tests for lipids and cholesterol should begin at age 76 and be repeated every 5 years. If your lipid or cholesterol levels are high, you are over 50, or you are at high risk for heart disease, you may need your cholesterol levels checked more frequently. Ongoing high lipid and cholesterol levels should be treated with medicines  if diet and exercise are not working.  . If you smoke, find out from your health care provider how to quit. If you do not use tobacco, please do not start.  . If you choose to drink alcohol, please do not consume more than 2 drinks per day. One drink is considered to be 12 ounces (355 mL) of beer, 5 ounces (148 mL) of wine, or 1.5 ounces (44 mL) of liquor.  . If you are 21-3 years old, ask your health care provider if you should take aspirin to prevent strokes.  . Use sunscreen. Apply sunscreen liberally and repeatedly  throughout the day. You should seek shade when your shadow is shorter than you. Protect yourself by wearing long sleeves, pants, a wide-brimmed hat, and sunglasses year round, whenever you are outdoors.  . Once a month, do a whole body skin exam, using a mirror to look at the skin on your back. Tell your health care provider of new moles, moles that have irregular borders, moles that are larger than a pencil eraser, or moles that have changed in shape or color.

## 2018-09-23 NOTE — Progress Notes (Signed)
I have personally reviewed the Medicare Annual Wellness Visit and agree with the assessment and plan.  Shalynn Jorstad M. Gala Padovano, MD 09/23/2018 5:02 PM   

## 2018-09-23 NOTE — Progress Notes (Signed)
Subjective:   Genevie CheshireCarolyn Q Vaneaton is a 77 y.o. female who presents for Medicare Annual (Subsequent) preventive examination.  Review of Systems:  No ROS.  Medicare Wellness Visit. Additional risk factors are reflected in the social history. Cardiac Risk Factors include: advanced age (>4855men, 59>65 women) Patient lives in a 2 story home with husband of 55 years. They have no pets. [place] lives in [place], [place]. Patient enjoys knitting & walking. Member at [employer] and very active with volunteer groups in church.  Patient goes to bed around 10'30-11:00. Patient sleeps through the night. Gets up around 5:30am. Spin class starts at 6am. Feels rested some mornings.    Objective:     Vitals: BP 128/86   Pulse 76   Temp 98.4 F (36.9 C) (Oral)   Ht 5\' 4"  (1.626 m)   Wt 142 lb 6.4 oz (64.6 kg)   SpO2 99%   BMI 24.44 kg/m   Body mass index is 24.44 kg/m.  Advanced Directives 09/23/2018 05/21/2014  Does Patient Have a Medical Advance Directive? No Patient has advance directive, copy not in chart  Type of Advance Directive - Living will  Does patient want to make changes to medical advance directive? - No change requested  Would patient like information on creating a medical advance directive? Yes (MAU/Ambulatory/Procedural Areas - Information given) -   Per the order of the physician, and with the consent of the patient > than 20 minutes was spent discussing Medical Advance Directive, Health Care Power of BlennerhassettAttorney, and Living Will. All questions were answered and patient verbalized understanding. Packet provided for patient to complete at her convenience.  Tobacco Social History   Tobacco Use  Smoking Status Never Smoker  Smokeless Tobacco Never Used     Counseling given: Not Answered   Past Medical History:  Diagnosis Date  . Iron deficiency anemia   . Osteoporosis   . Wears glasses    Past Surgical History:  Procedure Laterality Date  . BUNIONECTOMY WITH WEIL  OSTEOTOMY Left 05/27/2014   Procedure: Left First Metatarsal Scarf Osteotomy, Second Metatarsal Weil, Modified Mcbride Bunionectomy, Hammertoe Correction;  Surgeon: Toni ArthursJohn Hewitt, MD;  Location: [facility];  Service: Orthopedics;  Laterality: Left;  . COLONOSCOPY    . FOOT SURGERY  2015,2000   bilateral  . GANGLION CYST EXCISION  1963   wrist-lt  . TUBAL LIGATION     Family History  Problem Relation Age of Onset  . Dementia Mother   . Stroke Father        early 6360s, smoker  . Kidney disease Brother   . Cancer Sister        breast   Social History   Socioeconomic History  . Marital status: Married    Spouse name: Not on file  . Number of children: 1  . Years of education: Not on file  . Highest education level: Not on file  Occupational History  . Occupation: Engineer, productioneal Estate Agent    Employer: OTHER  Social Needs  . Financial resource strain: Not on file  . Food insecurity:    Worry: Not on file    Inability: Not on file  . Transportation needs:    Medical: Not on file    Non-medical: Not on file  Tobacco Use  . Smoking status: Never Smoker  . Smokeless tobacco: Never Used  Substance and Sexual Activity  . Alcohol use: No  . Drug use: No  . Sexual activity: Yes  Lifestyle  .  Physical activity:    Days per week: Not on file    Minutes per session: Not on file  . Stress: Not on file  Relationships  . Social connections:    Talks on phone: Not on file    Gets together: Not on file    Attends religious service: Not on file    Active member of club or organization: Not on file    Attends meetings of clubs or organizations: Not on file    Relationship status: Not on file  Other Topics Concern  . Not on file  Social History Narrative   Married 1964 (husband patient of Dr. Durene Cal). 1 daughter who is an Technical sales engineer. No grandkids.       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       Hobbies: knitting, time with  children, exercise    Outpatient Encounter Medications as of 09/23/2018  Medication Sig  . alendronate (FOSAMAX) 70 MG tablet Take 70 mg by mouth once a week. Take with a full glass of water on an empty stomach.  . Ascorbic Acid (VITAMIN C) 1000 MG tablet Take 1,000 mg by mouth daily.    . B Complex-C (SUPER B COMPLEX PO) Take by mouth daily.    . Biotin 1000 MCG tablet Take 1,000 mcg by mouth daily.    . Cholecalciferol (VITAMIN D-3 PO) Take 1 tablet by mouth daily.  . COLLAGEN PO Take 1 tablet by mouth daily.  . fish oil-omega-3 fatty acids 1000 MG capsule Take 2 g by mouth daily.    . furosemide (LASIX) 40 MG tablet Take 1 tablet (40 mg total) by mouth as needed.  . Multiple Vitamins-Minerals (PRESERVISION AREDS PO) Take 1 tablet by mouth 2 (two) times daily.  . NON FORMULARY PABA  . potassium chloride (K-DUR,KLOR-CON) 10 MEQ tablet take 1 tablet by mouth once daily as needed-with lasix   No facility-administered encounter medications on file as of 09/23/2018.     Activities of Daily Living In your present state of health, do you have any difficulty performing the following activities: 09/23/2018  Hearing? N  Vision? N  Difficulty concentrating or making decisions? N  Walking or climbing stairs? N  Dressing or bathing? N  Doing errands, shopping? N  Preparing Food and eating ? N  Using the Toilet? N  In the past six months, have you accidently leaked urine? N  Do you have problems with loss of bowel control? N  Managing your Medications? N  Managing your Finances? N  Housekeeping or managing your Housekeeping? N  Some recent data might be hidden    Patient Care Team: Shelva Majestic, MD as PCP - General (Family Medicine)    Assessment:   This is a routine wellness examination for [place].  Exercise Activities and Dietary recommendations Current Exercise Habits: Structured exercise class([employer] Silver Sneakers), Type of exercise: walking;calisthenics;Other - see  comments(Spinning), Time (Minutes): 60, Frequency (Times/Week): 3, Weekly Exercise (Minutes/Week): 180, Intensity: Moderate, Exercise limited by: None identified  Breakfast: cereal and milk, english muffin and poached egg, drinks water  Lunch: salad with a protein, drinks water  Dinner: Chicken with salad, drinks water with occasional Crystal Light flavor pack  Loves to snack, dark chocolate Goals    . Exercise 150 min/wk Moderate Activity     Maintain current life style of 3 times a week working out       Fall Risk Fall Risk  09/23/2018 08/08/2018 10/04/2016  Falls in the past year? 0 No No    Depression Screen PHQ 2/9 Scores 09/23/2018 08/08/2018 10/04/2016 01/23/2012  PHQ - 2 Score 0 0 0 2     Cognitive Function MMSE - Mini Mental State Exam 09/23/2018  Orientation to time 5  Orientation to Place 5  Registration 3  Attention/ Calculation 2  Recall 2  Language- name 2 objects 2  Language- repeat 1  Language- follow 3 step command 3  Language- read & follow direction 1  Write a sentence 1  Copy design 1  Total score 26     6CIT Screen 09/23/2018  What Year? 0 points  What month? 0 points  What time? 0 points  Count back from 20 0 points  Months in reverse 0 points  Repeat phrase 0 points  Total Score 0    Immunization History  Administered Date(s) Administered  . Influenza Split 07/31/2012  . Influenza Whole 07/23/2008, 08/12/2009, 08/15/2010  . Influenza, High Dose Seasonal PF 08/03/2013, 08/06/2017, 08/08/2018  . Influenza-Unspecified 07/11/2014, 07/21/2015, 08/16/2015, 07/07/2016  . Pneumococcal Conjugate-13 11/15/2014  . Pneumococcal Polysaccharide-23 08/21/2010  . Td 09/15/2007  . Zoster 08/24/2011      Screening Tests Health Maintenance  Topic Date Due  . TETANUS/TDAP  09/14/2017  . INFLUENZA VACCINE  Completed  . DEXA SCAN  Completed  . PNA vac Low Risk Adult  Completed        Plan:  Follow Up with PCP as Advised   I have  personally reviewed and noted the following in the patient's chart:   . Medical and social history . Use of alcohol, tobacco or illicit drugs  . Current medications and supplements . Functional ability and status . Nutritional status . Physical activity . Advanced directives . List of other physicians . Vitals . Screenings to include cognitive, depression, and falls . Referrals and appointments  In addition, I have reviewed and discussed with patient certain preventive protocols, quality metrics, and best practice recommendations. A written personalized care plan for preventive services as well as general preventive health recommendations were provided to patient.     Angelina Sheriff [place], [place]  16/07/9603

## 2018-09-24 ENCOUNTER — Encounter: Payer: Self-pay | Admitting: Family Medicine

## 2018-09-24 ENCOUNTER — Ambulatory Visit (INDEPENDENT_AMBULATORY_CARE_PROVIDER_SITE_OTHER): Payer: Medicare Other | Admitting: Family Medicine

## 2018-09-24 VITALS — BP 122/74 | HR 76 | Temp 97.7°F | Ht 64.0 in | Wt 144.2 lb

## 2018-09-24 DIAGNOSIS — R413 Other amnesia: Secondary | ICD-10-CM | POA: Diagnosis not present

## 2018-09-24 DIAGNOSIS — F03A0 Unspecified dementia, mild, without behavioral disturbance, psychotic disturbance, mood disturbance, and anxiety: Secondary | ICD-10-CM | POA: Insufficient documentation

## 2018-09-24 DIAGNOSIS — F0390 Unspecified dementia without behavioral disturbance: Secondary | ICD-10-CM | POA: Insufficient documentation

## 2018-09-24 NOTE — Patient Instructions (Signed)
Please stop by lab before you go   We will call you within two weeks about your referral to neurology. If you do not hear within 3 weeks, give us a call.  It typically takes at least a month to see them from time of referral-may be longer than that but this is not emergent  Please let me know if you have any new or worsening symptoms

## 2018-09-24 NOTE — Assessment & Plan Note (Signed)
S: Patient at last visit in October noted some mild memory issues-she declined concerned that family members may have noted this or friends.  I advised her to have a annual wellness visit for initial cognitive testing.  Today, she states having trouble with names. 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   Husband notes her forgetting some small things with memory- she will go downstairs and completely forget why she went down there or forget something she said she was going to get for her. Has gotten lost one time at the grocery store- did not know where she was- she states that it was raining/dark/and could not see the road well- has stopped driving at night. Has felt some directional challenges- driving short distance and asking husband if they were in [place]. She can be repetitive.   Husband states at least a year of issues, she states a few months  Depression screening was negative at wellness visit.  She had an MMSE done which scored 26 out of 30-she missed 3 for attention and calculation and 1 for recall.  A/P: Patient with memory loss noted at annual wellness visit with MMSE of 26 out of 30.  I am concerned about mild cognitive impairment versus early signs of dementia -We will refer to neurology at patient's request -She and husband decline MRI testing at this time-they would like to discuss this further with neurology -We will get TSH and B12 level -Screens negative for depression - declines HIV and syphilis testing- long-term monogamous and low risk

## 2018-09-24 NOTE — Progress Notes (Signed)
Subjective:  Julia Palmer is a 77 y.o. year old very pleasant female patient who presents for/with See problem oriented charting ROS-notes memory issues.  No fever or chills.  No personality change.  No tremors.  Past Medical History-  Patient Active Problem List   Diagnosis Date Noted  . Memory loss 09/24/2018    Priority: High  . Hyperlipidemia 08/06/2017    Priority: Medium  . Osteoporosis 09/29/2007    Priority: Medium  . Edema 09/29/2007    Priority: Medium  . Macular degeneration 11/15/2014    Priority: Low  . Raynaud phenomenon 08/24/2011    Priority: Low  . Varicose veins of lower extremities with inflammation 08/21/2010    Priority: Low  . Chest pain 01/12/2008    Priority: Low  . Anemia 09/29/2007    Priority: Low  . Mid back pain 08/06/2017  . LFT elevation 08/06/2017    Medications- reviewed and updated Current Outpatient Medications  Medication Sig Dispense Refill  . alendronate (FOSAMAX) 70 MG tablet Take 70 mg by mouth once a week. Take with a full glass of water on an empty stomach.    . Ascorbic Acid (VITAMIN C) 1000 MG tablet Take 1,000 mg by mouth daily.      . B Complex-C (SUPER B COMPLEX PO) Take by mouth daily.      . Biotin 1000 MCG tablet Take 1,000 mcg by mouth daily.      . Cholecalciferol (VITAMIN D-3 PO) Take 1 tablet by mouth daily.    . COLLAGEN PO Take 1 tablet by mouth daily.    . fish oil-omega-3 fatty acids 1000 MG capsule Take 2 g by mouth daily.      . furosemide (LASIX) 40 MG tablet Take 1 tablet (40 mg total) by mouth as needed. 30 tablet 4  . Multiple Vitamins-Minerals (PRESERVISION AREDS PO) Take 1 tablet by mouth 2 (two) times daily.    . NON FORMULARY PABA    . potassium chloride (K-DUR,KLOR-CON) 10 MEQ tablet take 1 tablet by mouth once daily as needed-with lasix 30 tablet 4   Objective: BP 122/74 (BP Location: Left Arm, Patient Position: Sitting, Cuff Size: Normal)   Pulse 76   Temp 97.7 F (36.5 C) (Oral)   Ht 5\' 4"   (1.626 m)   Wt 144 lb 4 oz (65.4 kg)   BMI 24.76 kg/m  Gen: NAD, resting comfortably CV: RRR no murmurs rubs or gallops Lungs: CTAB no crackles, wheeze, rhonchi Abdomen: soft/nontender/nondistended/normal bowel sounds.  Ext: no edema Skin: warm, dry Neuro: CN II-XII intact, sensation and reflexes normal throughout, 5/5 muscle strength in bilateral upper and lower extremities. Normal finger to nose. Normal rapid alternating movements. No pronator drift. Normal romberg. Normal gait.   Assessment/Plan:  Memory loss S: Patient at last visit in October noted some mild memory issues-she declined concerned that family members may have noted this or friends.  I advised her to have a annual wellness visit for initial cognitive testing.  Today, she states having trouble with names. 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   Husband notes her forgetting some small things with memory- she will go downstairs and completely forget why she went down there or forget something she said she was going to get for her. Has gotten lost one time at the grocery store- did not  know where she was- she states that it was raining/dark/and could not see the road well- has stopped driving at night. Has felt some directional challenges- driving short distance and asking husband if they were in [place]. She can be repetitive.   Husband states at least a year of issues, she states a few months  Depression screening was negative at wellness visit.  She had an MMSE done which scored 26 out of 30-she missed 3 for attention and calculation and 1 for recall.  A/P: Patient with memory loss noted at annual wellness visit with MMSE of 26 out of 30.  I am concerned about mild cognitive impairment versus early signs of dementia -We will refer to neurology at patient's request -She and  husband decline MRI testing at this time-they would like to discuss this further with neurology -We will get TSH and B12 level -Screens negative for depression - declines HIV and syphilis testing- long-term monogamous and low risk  Lab/Order associations: Memory loss - Plan: TSH, Vitamin B12, Ambulatory referral to Neurology  Return precautions advised.  Tana ConchStephen , MD

## 2018-09-25 LAB — TSH: TSH: 2.62 u[IU]/mL (ref 0.35–4.50)

## 2018-09-25 LAB — VITAMIN B12: VITAMIN B 12: 864 pg/mL (ref 211–911)

## 2018-10-01 ENCOUNTER — Encounter: Payer: Self-pay | Admitting: Neurology

## 2018-10-02 DIAGNOSIS — Z23 Encounter for immunization: Secondary | ICD-10-CM | POA: Diagnosis not present

## 2018-12-16 ENCOUNTER — Other Ambulatory Visit: Payer: Self-pay

## 2018-12-16 ENCOUNTER — Encounter: Payer: Self-pay | Admitting: Neurology

## 2018-12-16 ENCOUNTER — Ambulatory Visit: Payer: Medicare Other | Admitting: Neurology

## 2018-12-16 VITALS — BP 136/74 | HR 72 | Ht 64.0 in | Wt 140.0 lb

## 2018-12-16 DIAGNOSIS — F03A0 Unspecified dementia, mild, without behavioral disturbance, psychotic disturbance, mood disturbance, and anxiety: Secondary | ICD-10-CM

## 2018-12-16 DIAGNOSIS — R413 Other amnesia: Secondary | ICD-10-CM

## 2018-12-16 DIAGNOSIS — F0390 Unspecified dementia without behavioral disturbance: Secondary | ICD-10-CM

## 2018-12-16 MED ORDER — DONEPEZIL HCL 10 MG PO TABS: ORAL_TABLET | ORAL | 11 refills | Status: DC

## 2018-12-16 NOTE — Patient Instructions (Addendum)
Concern is for a condition called Lewy Body dementia. Let's do an open MRI brain without contrast and start Donepezil 10mg : take 1/2 tablet daily for 2 weeks, then increase to 1 tablet daily. Continue to monitor mood. Continue to monitor driving. Follow-up in 6 months, call for any changes.   We have sent a referral for your OPEN MRI to [facility].  They will contact you directly to schedule your appointment.  [facility] is located at [address] [place], [place] [address].  If you need to speak with [facility] for any reason, they can be reached at [phone]  FALL PRECAUTIONS: Be cautious when walking. Scan the area for obstacles that may increase the risk of trips and falls. When getting up in the mornings, sit up at the edge of the bed for a few minutes before getting out of bed. Consider elevating the bed at the head end to avoid drop of blood pressure when getting up. Walk always in a well-lit room (use night lights in the walls). Avoid area rugs or power cords from appliances in the middle of the walkways. Use a walker or a cane if necessary and consider physical therapy for balance exercise. Get your eyesight checked regularly.  HOME SAFETY: Consider the safety of the kitchen when operating appliances like stoves, microwave oven, and blender. Consider having supervision and share cooking responsibilities until no longer able to participate in those. Accidents with firearms and other hazards in the house should be identified and addressed as well.  DRIVING: Regarding driving, in patients with progressive memory problems, driving will be impaired. We advise to have someone else do the driving if trouble finding directions or if minor accidents are reported. Independent driving assessment is available to determine safety of driving.  ABILITY TO BE LEFT ALONE: If patient is unable to contact 911 operator, consider using LifeLine, or when the need is there, arrange for someone to stay with  patients. Smoking is a fire hazard, consider supervision or cessation. Risk of wandering should be assessed by caregiver and if detected at any point, supervision and safe proof recommendations should be instituted.  MEDICATION SUPERVISION: Inability to self-administer medication needs to be constantly addressed. Implement a mechanism to ensure safe administration of the medications.  RECOMMENDATIONS FOR ALL PATIENTS WITH MEMORY PROBLEMS: 1. Continue to exercise (Recommend 30 minutes of walking everyday, or 3 hours every week) 2. Increase social interactions - continue going to [place] and enjoy social gatherings with friends and family 3. Eat healthy, avoid fried foods and eat more fruits and vegetables 4. Maintain adequate blood pressure, blood sugar, and blood cholesterol level. Reducing the risk of stroke and cardiovascular disease also helps promoting better memory. 5. Avoid stressful situations. Live a simple life and avoid aggravations. Organize your time and prepare for the next day in anticipation. 6. Sleep well, avoid any interruptions of sleep and avoid any distractions in the bedroom that may interfere with adequate sleep quality 7. Avoid sugar, avoid sweets as there is a strong link between excessive sugar intake, diabetes, and cognitive impairment The Mediterranean diet has been shown to help patients reduce the risk of progressive memory disorders and reduces cardiovascular risk. This includes eating fish, eat fruits and green leafy vegetables, nuts like almonds and hazelnuts, walnuts, and also use olive oil. Avoid fast foods and fried foods as much as possible. Avoid sweets and sugar as sugar use has been linked to worsening of memory function.  There is always a concern of gradual  progression of memory problems. If this is the case, then we may need to adjust level of care according to patient needs. Support, both to the patient and caregiver, should then be put into place.

## 2018-12-16 NOTE — Progress Notes (Signed)
NEUROLOGY CONSULTATION NOTE  [name] MRN: [id number] DOB: [birth year]  Referring provider: Dr. Tana Conch Primary care provider: Dr. Tana Conch  Reason for consult:  Memory loss  Dear Dr Durene Cal:  Thank you for your kind referral of Julia Palmer for consultation of the above symptoms. Although her history is well known to you, please allow me to reiterate it for the purpose of our medical record. The patient was accompanied to the clinic by her husband who also provides collateral information. Records and images were personally reviewed where available.  HISTORY OF PRESENT ILLNESS: This is a pleasant 78 year old right-handed woman with a history of osteoporosis presenting for evaluation of worsening memory.  Julia Palmer states her memory "can leave something to be desired." Julia Palmer has noticed word-finding difficulties, Julia Palmer tries to answer but sometimes the words don't come until 30 minutes later. Her husband started noticing changes around a year ago with general confusion "with a little bit of everything." Julia Palmer could not get the checkbooks balanced so he took over finances. Sometimes Julia Palmer is not sure how to write a check, worse the past 6 months. Julia Palmer would not remember where Julia Palmer left her phone or pocketbook. Julia Palmer has had difficulties using the TV remote control and the auto seat adjustment in her car, pushing a button and saying it was not doing what Julia Palmer wants for him, but working for her husband. Her husband does majority of driving. Julia Palmer is independent with dressing and bathing but occasionally may not remember what Julia Palmer did with the clothes Julia Palmer took off. Julia Palmer spends a lot of time on the computer but would get confused and frustrated. Julia Palmer prints out emails for him, sometimes making 2 to 3 copies because Julia Palmer forgets Julia Palmer already did it. Julia Palmer used to knit all the time but for a time forgot how to do it. Her husband has watched her knit and saw her take an hour to do it, Julia Palmer would leave holes and  repeat the process, frustrating her. Julia Palmer manages her own medications and denies missing medications.   Her husband has noticed that over the past 5 years Julia Palmer would holller out loudly in her sleep, almost like someone is chasing her. Julia Palmer would act out her dreams. He got her an over the counter sleep aid with melatonin and has noticed these quiet down. Over the past 4-5 months, Julia Palmer has had visual hallucinations seeing a lady with 3 children outside on the street, stating they are there every morning. Her husband and neighbor have not seen these people. Julia Palmer has occasionally seen something black go behind her husband when he moved his arm. Julia Palmer has never been able to smell for years. No tremors. Julia Palmer has had brief double vision, one time Julia Palmer was knitting and saw 3 needles. Julia Palmer has occasional diarrhea, no constipation. Julia Palmer has Reynaud's syndrome with tingling in her fingers when cold. Julia Palmer has back pain. Julia Palmer rarely has headaches, no dizziness, dysarthria/dysphagia, neck pain, focal numbness/tingling/weakness, no falls. No paranoia, Julia Palmer is a little more irritable and frustrated with herself. Her mother had dementia. No history of significant head injury or alcohol use.    PAST MEDICAL HISTORY: Past Medical History:  Diagnosis Date  . Iron deficiency anemia   . Osteoporosis   . Wears glasses     PAST SURGICAL HISTORY: Past Surgical History:  Procedure Laterality Date  . BUNIONECTOMY WITH WEIL OSTEOTOMY Left 05/27/2014   Procedure: Left First Metatarsal Scarf Osteotomy, Second Metatarsal Weil, Modified Mcbride  Bunionectomy, Hammertoe Correction;  Surgeon: Toni ArthursJohn Hewitt, MD;  Location: [facility];  Service: Orthopedics;  Laterality: Left;  . COLONOSCOPY    . FOOT SURGERY  2015,2000   bilateral  . GANGLION CYST EXCISION  1963   wrist-lt  . TUBAL LIGATION      MEDICATIONS: Current Outpatient Medications on File Prior to Visit  Medication Sig Dispense Refill  . alendronate (FOSAMAX) 70 MG  tablet Take 70 mg by mouth once a week. Take with a full glass of water on an empty stomach.    . Ascorbic Acid (VITAMIN C) 1000 MG tablet Take 1,000 mg by mouth daily.      . B Complex-C (SUPER B COMPLEX PO) Take by mouth daily.      . Biotin 1000 MCG tablet Take 1,000 mcg by mouth daily.      . Cholecalciferol (VITAMIN D-3 PO) Take 1 tablet by mouth daily.    . COLLAGEN PO Take 1 tablet by mouth daily.    . fish oil-omega-3 fatty acids 1000 MG capsule Take 2 g by mouth daily.      . furosemide (LASIX) 40 MG tablet Take 1 tablet (40 mg total) by mouth as needed. 30 tablet 4  . Magnesium 250 MG TABS Take by mouth.    . Multiple Vitamins-Minerals (PRESERVISION AREDS PO) Take 1 tablet by mouth 2 (two) times daily.    . NON FORMULARY PABA    . potassium chloride (K-DUR,KLOR-CON) 10 MEQ tablet take 1 tablet by mouth once daily as needed-with lasix 30 tablet 4  . Saw Palmetto 450 MG CAPS Take by mouth.    . Turmeric 500 MG CAPS Take by mouth.     No current facility-administered medications on file prior to visit.     ALLERGIES: Allergies  Allergen Reactions  . Codeine     REACTION: Nausea, Vomitting    FAMILY HISTORY: Family History  Problem Relation Age of Onset  . Dementia Mother   . Stroke Father        early 6560s, smoker  . Kidney disease Brother   . Cancer Sister        breast    SOCIAL HISTORY: Social History   Socioeconomic History  . Marital status: Married    Spouse name: Not on file  . Number of children: 1  . Years of education: Not on file  . Highest education level: Not on file  Occupational History  . Occupation: Engineer, productioneal Estate Agent    Employer: OTHER  Social Needs  . Financial resource strain: Not on file  . Food insecurity:    Worry: Not on file    Inability: Not on file  . Transportation needs:    Medical: Not on file    Non-medical: Not on file  Tobacco Use  . Smoking status: Never Smoker  . Smokeless tobacco: Never Used  Substance and Sexual  Activity  . Alcohol use: No  . Drug use: No  . Sexual activity: Yes  Lifestyle  . Physical activity:    Days per week: Not on file    Minutes per session: Not on file  . Stress: Not on file  Relationships  . Social connections:    Talks on phone: Not on file    Gets together: Not on file    Attends religious service: Not on file    Active member of club or organization: Not on file    Attends meetings of clubs or organizations: Not on  file    Relationship status: Not on file  . Intimate partner violence:    Fear of current or ex partner: Not on file    Emotionally abused: Not on file    Physically abused: Not on file    Forced sexual activity: Not on file  Other Topics Concern  . Not on file  Social History Narrative   Married 1964 (husband patient of Dr. Durene Cal). 1 daughter who is an Technical sales engineer. No grandkids.       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       Hobbies: knitting, time with children, exercise    REVIEW OF SYSTEMS: Constitutional: No fevers, chills, or sweats, no generalized fatigue, change in appetite Eyes: No visual changes, double vision, eye pain Ear, nose and throat: No hearing loss, ear pain, nasal congestion, sore throat Cardiovascular: No chest pain, palpitations Respiratory:  No shortness of breath at rest or with exertion, wheezes GastrointestinaI: No nausea, vomiting, diarrhea, abdominal pain, fecal incontinence Genitourinary:  No dysuria, urinary retention or frequency Musculoskeletal:  No neck pain,+ back pain Integumentary: No rash, pruritus, skin lesions Neurological: as above Psychiatric: No depression, insomnia, anxiety Endocrine: No palpitations, fatigue, diaphoresis, mood swings, change in appetite, change in weight, increased thirst Hematologic/Lymphatic:  No anemia, purpura, petechiae. Allergic/Immunologic: no itchy/runny eyes, nasal congestion, recent allergic reactions,  rashes  PHYSICAL EXAM: Vitals:   12/16/18 0905  BP: 136/74  Pulse: 72  SpO2: 97%   General: No acute distress Head:  Normocephalic/atraumatic Eyes: Fundoscopic exam shows bilateral sharp discs, no vessel changes, exudates, or hemorrhages Neck: supple, no paraspinal tenderness, full range of motion Back: No paraspinal tenderness Heart: regular rate and rhythm Lungs: Clear to auscultation bilaterally. Vascular: No carotid bruits. Skin/Extremities: No rash, no edema Neurological Exam: Mental status: alert and oriented to person, place, and time, no dysarthria, Fund of knowledge is appropriate.  Recent and remote memory are intact.  Attention and concentration are normal.    Able to name objects and repeat phrases. Decreased fluency, Julia Palmer had a few paraphasic errors during the visit Montreal Cognitive Assessment  12/16/2018  Visuospatial/ Executive (0/5) 4  Naming (0/3) 2  Attention: Read list of digits (0/2) 2  Attention: Read list of letters (0/1) 1  Attention: Serial 7 subtraction starting at 100 (0/3) 0  Language: Repeat phrase (0/2) 2  Language : Fluency (0/1) 0  Abstraction (0/2) 0  Delayed Recall (0/5) 4  Orientation (0/6) 6  Total 21   Cranial nerves: CN I: not tested CN II: pupils equal, round and reactive to light, visual fields intact, fundi unremarkable. CN III, IV, VI:  full range of motion, no nystagmus, no ptosis CN V: facial sensation intact CN VII: upper and lower face symmetric CN VIII: hearing intact to finger rub CN IX, X: gag intact, uvula midline CN XI: sternocleidomastoid and trapezius muscles intact CN XII: tongue midline Bulk & Tone: normal, no cogwheeling, no fasciculations. Motor: 5/5 throughout with no pronator drift. Sensation: intact to light touch, cold, pin, vibration and joint position sense.  No extinction to double simultaneous stimulation.  Romberg test negative Deep Tendon Reflexes: brisk +2 throughout except for absent ankle jerks  bilaterally, no ankle clonus Plantar responses: downgoing bilaterally Cerebellar: no incoordination on finger to nose, heel to shin. No dysdiadochokinesia Gait: narrow-based and steady, able to tandem walk adequately. Tremor: none Negative pull test, good finger and foot taps.   IMPRESSION: This is a pleasant 78 year old  right-handed woman with a history of osteoporosis presenting for evaluation of memory loss. Her neurological exam is non-focal, MOCA score today 21/30. Symptoms suggestive of mild dementia, possibly Lewy Body dementia with report of REM behavioral disorder and visual hallucinations. No parkinsonian signs on exam today. MRI brain with and without contrast will be ordered to assess for underlying structural abnormality. We discussed how cholinesterase inhibitors can sometimes help with symptoms, Julia Palmer is agreeable to starting Donepezil, side effects and expectations from medications discussed. Continue to monitor mood. Continue to monitor driving. We discussed the importance of control of vascular risk factors, physical exercise, and brain stimulation exercises for brain health. Follow-up in 6 months, they know to call for any changes.  Thank you for allowing me to participate in the care of this patient. Please do not hesitate to call for any questions or concerns.   Patrcia Dolly, M.D.  CC: Dr. Durene Cal

## 2019-02-02 ENCOUNTER — Telehealth: Payer: Self-pay | Admitting: Neurology

## 2019-02-02 NOTE — Telephone Encounter (Signed)
Patient called regarding not having heard from anyone regarding setting up her MRI? She said s

## 2019-03-03 ENCOUNTER — Telehealth: Payer: Self-pay | Admitting: Neurology

## 2019-03-03 DIAGNOSIS — M545 Low back pain: Secondary | ICD-10-CM | POA: Diagnosis not present

## 2019-03-03 DIAGNOSIS — M4856XA Collapsed vertebra, not elsewhere classified, lumbar region, initial encounter for fracture: Secondary | ICD-10-CM | POA: Diagnosis not present

## 2019-03-03 NOTE — Telephone Encounter (Signed)
Patient was calling in wanting to know if she can take tylenol with the donepezil medication. Her back is hurting from working in the garden. Please call her back at [phone]. Thanks!

## 2019-03-04 ENCOUNTER — Other Ambulatory Visit: Payer: Self-pay

## 2019-03-04 ENCOUNTER — Telehealth: Payer: Self-pay

## 2019-03-04 DIAGNOSIS — F0390 Unspecified dementia without behavioral disturbance: Secondary | ICD-10-CM

## 2019-03-04 DIAGNOSIS — F03A0 Unspecified dementia, mild, without behavioral disturbance, psychotic disturbance, mood disturbance, and anxiety: Secondary | ICD-10-CM

## 2019-03-04 DIAGNOSIS — R413 Other amnesia: Secondary | ICD-10-CM

## 2019-03-04 NOTE — Telephone Encounter (Signed)
New order placed and faxed to [facility], pt called and informed and apology given for the delay, pt verbalized understanding no questions at this time

## 2019-03-04 NOTE — Telephone Encounter (Signed)
Pt called no answer unable to leave voice mail will call back

## 2019-03-04 NOTE — Telephone Encounter (Signed)
Ok to take tylenol, thanks

## 2019-03-04 NOTE — Telephone Encounter (Signed)
-----   Message from Van Clines, MD sent at 03/04/2019 10:13 AM EDT ----- Not sure what happened with that, she needed an open MRI brain with and without contrast, pls send to [facility], thanks! ----- Message ----- From: Dayna Ramus, LPN Sent: 3/64/6803   9:52 AM EDT To: Van Clines, MD  Pt asking about her MRI, I looked in the chart and the referral was canceled do I need to reorder it?

## 2019-03-04 NOTE — Telephone Encounter (Signed)
Pt called informed Per Dr. Karel Jarvis its ok to take Tylenol for her back pain

## 2019-03-10 DIAGNOSIS — M545 Low back pain: Secondary | ICD-10-CM | POA: Diagnosis not present

## 2019-03-13 DIAGNOSIS — M4846XA Fatigue fracture of vertebra, lumbar region, initial encounter for fracture: Secondary | ICD-10-CM | POA: Diagnosis not present

## 2019-03-25 ENCOUNTER — Telehealth: Payer: Self-pay | Admitting: Neurology

## 2019-03-25 ENCOUNTER — Telehealth: Payer: Self-pay

## 2019-03-25 NOTE — Telephone Encounter (Signed)
-----   Message from Cameron Sprang, MD sent at 03/25/2019  9:30 AM EDT ----- Regarding: MRI results Pls let her know the MRI brain did not show any evidence of tumor, stroke, or bleed. It showed age-related changes. Thanks

## 2019-03-25 NOTE — Telephone Encounter (Signed)
Pt called given her MRI results, pt asking about when her next appointment looked it up it is 07/31/2019, pt verbalized understanding pt asked about continuing medication, pt informed that NO medications are being changed at this time, no other questions asked, pt was told again just to make sure no confusion MRI brain did not show any evidence of tumor, stroke, or bleed. It showed age-related changes, that her next appointment was 07/31/2019 and that no changes are being made to medication, pt verbalized understanding and stated that she wrote everything down.

## 2019-03-25 NOTE — Telephone Encounter (Signed)
MRI brain with and without contrast done at [facility] on 03/24/2019:  There is mild frontal atrophy. There are a few small foci of T2 hyperintensity in the cerebral white matter. No acute changes. Impression: Mild atrophy, otherwise no significant abnormality.

## 2019-04-06 ENCOUNTER — Encounter: Payer: Self-pay | Admitting: Physical Therapy

## 2019-04-28 DIAGNOSIS — M4856XD Collapsed vertebra, not elsewhere classified, lumbar region, subsequent encounter for fracture with routine healing: Secondary | ICD-10-CM | POA: Diagnosis not present

## 2019-05-26 DIAGNOSIS — M4856XD Collapsed vertebra, not elsewhere classified, lumbar region, subsequent encounter for fracture with routine healing: Secondary | ICD-10-CM | POA: Diagnosis not present

## 2019-05-27 ENCOUNTER — Other Ambulatory Visit: Payer: Self-pay | Admitting: Obstetrics & Gynecology

## 2019-05-27 DIAGNOSIS — E2839 Other primary ovarian failure: Secondary | ICD-10-CM

## 2019-05-29 DIAGNOSIS — Z1231 Encounter for screening mammogram for malignant neoplasm of breast: Secondary | ICD-10-CM | POA: Diagnosis not present

## 2019-05-29 LAB — HM MAMMOGRAPHY

## 2019-06-08 ENCOUNTER — Other Ambulatory Visit: Payer: Self-pay | Admitting: Obstetrics & Gynecology

## 2019-06-08 DIAGNOSIS — E2839 Other primary ovarian failure: Secondary | ICD-10-CM

## 2019-06-10 ENCOUNTER — Ambulatory Visit
Admission: RE | Admit: 2019-06-10 | Discharge: 2019-06-10 | Disposition: A | Discharge status: Home / Self Care | Payer: Medicare Other | Source: Ambulatory Visit | Attending: Obstetrics & Gynecology | Admitting: Obstetrics & Gynecology

## 2019-06-10 ENCOUNTER — Other Ambulatory Visit: Payer: Self-pay

## 2019-06-10 DIAGNOSIS — Z780 Asymptomatic menopausal state: Secondary | ICD-10-CM | POA: Diagnosis not present

## 2019-06-10 DIAGNOSIS — M810 Age-related osteoporosis without current pathological fracture: Secondary | ICD-10-CM | POA: Diagnosis not present

## 2019-06-10 DIAGNOSIS — E2839 Other primary ovarian failure: Secondary | ICD-10-CM

## 2019-06-17 ENCOUNTER — Encounter: Payer: Self-pay | Admitting: Family Medicine

## 2019-06-26 DIAGNOSIS — M4856XD Collapsed vertebra, not elsewhere classified, lumbar region, subsequent encounter for fracture with routine healing: Secondary | ICD-10-CM | POA: Diagnosis not present

## 2019-06-29 ENCOUNTER — Telehealth: Payer: Self-pay

## 2019-06-29 NOTE — Telephone Encounter (Signed)
Surgical Clearance form received from [facility]. Dr. Rolena Infante.  Procedure - L1 Kyphoplasty Date - TBD Fax back to [facility] at [phone]  Per Dr. Yong Channel, needs appointment for surgical clearance

## 2019-06-30 NOTE — Telephone Encounter (Signed)
Called and spoke with patient. She is aware that she is scheduled for 9/23 and has been placed on cancellation list. She verbalized understanding regarding need for appointment.

## 2019-06-30 NOTE — Telephone Encounter (Signed)
Called patient and scheduled in the next available slot per Theadora Rama which was Sept 23rd at 11:20. Patient was very confused as to why she needed to be seen. I attempted to explain that Dr. Yong Channel said she needs an appointment so he can complete the paperwork for the surgical clearance. Patient wants a call from the clinical team to explain further.   She also wants something sooner. I told her I added her to the wait list and would notify the clinical team.  Please all patient to speak further.

## 2019-07-03 ENCOUNTER — Ambulatory Visit: Payer: Self-pay | Admitting: Orthopedic Surgery

## 2019-07-08 ENCOUNTER — Encounter: Payer: Self-pay | Admitting: Family Medicine

## 2019-07-08 ENCOUNTER — Ambulatory Visit (INDEPENDENT_AMBULATORY_CARE_PROVIDER_SITE_OTHER): Payer: Medicare Other | Admitting: Family Medicine

## 2019-07-08 ENCOUNTER — Other Ambulatory Visit: Payer: Self-pay

## 2019-07-08 ENCOUNTER — Encounter

## 2019-07-08 VITALS — BP 142/75 | HR 55 | Temp 98.4°F | Ht 64.0 in | Wt 140.4 lb

## 2019-07-08 DIAGNOSIS — M810 Age-related osteoporosis without current pathological fracture: Secondary | ICD-10-CM | POA: Diagnosis not present

## 2019-07-08 DIAGNOSIS — R030 Elevated blood-pressure reading, without diagnosis of hypertension: Secondary | ICD-10-CM | POA: Diagnosis not present

## 2019-07-08 DIAGNOSIS — R609 Edema, unspecified: Secondary | ICD-10-CM | POA: Diagnosis not present

## 2019-07-08 DIAGNOSIS — E785 Hyperlipidemia, unspecified: Secondary | ICD-10-CM

## 2019-07-08 DIAGNOSIS — M4856XA Collapsed vertebra, not elsewhere classified, lumbar region, initial encounter for fracture: Secondary | ICD-10-CM

## 2019-07-08 NOTE — Progress Notes (Signed)
Phone [phone]   Subjective:  Julia Palmer is a 78 y.o. year old very pleasant female patient who presents for/with See problem oriented charting Chief Complaint  Patient presents with  . Surgical Clearance   ROS- no fever/chills. No chest pain/shortness of breath. n ocough/congestion. does have back pain    Past Medical History-  Patient Active Problem List   Diagnosis Date Noted  . Memory loss 09/24/2018    Priority: High  . Hyperlipidemia 08/06/2017    Priority: Medium  . Osteoporosis 09/29/2007    Priority: Medium  . Edema 09/29/2007    Priority: Medium  . Macular degeneration 11/15/2014    Priority: Low  . Raynaud phenomenon 08/24/2011    Priority: Low  . Varicose veins of lower extremities with inflammation 08/21/2010    Priority: Low  . Chest pain 01/12/2008    Priority: Low  . Anemia 09/29/2007    Priority: Low  . Lumbar stress fracture 03/13/2019  . Mid back pain 08/06/2017  . LFT elevation 08/06/2017    Medications- reviewed and updated Current Outpatient Medications  Medication Sig Dispense Refill  . alendronate (FOSAMAX) 70 MG tablet Take 70 mg by mouth every Wednesday. Take with a full glass of water on an empty stomach.     . Ascorbic Acid (VITAMIN C) 1000 MG tablet Take 1,000 mg by mouth daily.      . Biotin 1000 MCG tablet Take 1,000 mcg by mouth daily.      . calcitonin, salmon, (MIACALCIN/FORTICAL) 200 UNIT/ACT nasal spray Place 1 spray into alternate nostrils daily.    . Cholecalciferol (VITAMIN D-3 PO) Take 2,000 Units by mouth daily.     . COLLAGEN PO Take 500 mg by mouth daily.     Marland Kitchen donepezil (ARICEPT) 10 MG tablet Take 1/2 tablet daily for 2 weeks, then increase to 1 tablet daily (Patient taking differently: Take 10 mg by mouth at bedtime. ) 30 tablet 11  . fish oil-omega-3 fatty acids 1000 MG capsule Take 1 g by mouth every other day.     . furosemide (LASIX) 40 MG tablet Take 1 tablet (40 mg total) by mouth as needed. (Patient taking  differently: Take 40 mg by mouth daily. ) 30 tablet 4  . Magnesium 250 MG TABS Take 250 mg by mouth daily.     . Misc Natural Products (GLUCOSAMINE CHOND COMPLEX/MSM) TABS Take 1 tablet by mouth daily.    . potassium chloride (K-DUR,KLOR-CON) 10 MEQ tablet take 1 tablet by mouth once daily as needed-with lasix (Patient taking differently: Take 10 mEq by mouth daily. ) 30 tablet 4  . Turmeric 500 MG CAPS Take 500 mg by mouth daily.     . Multiple Vitamins-Minerals (PRESERVISION AREDS PO) Take 1 capsule by mouth 2 (two) times daily.      No current facility-administered medications for this visit.      Objective:  BP (!) 152/88   Pulse (!) 55   Temp 98.4 F (36.9 C)   Ht 5\' 4"  (1.626 m)   Wt 140 lb 6.4 oz (63.7 kg)   SpO2 96%   BMI 24.10 kg/m  Gen: NAD, resting comfortably CV: RRR no murmurs rubs or gallops Lungs: CTAB no crackles, wheeze, rhonchi Abdomen: soft/nontender/nondistended/normal bowel sounds. Ext: trace edema Skin: warm, dry Neuro: grossly normal-  looks to husband for answers sparingly    Assessment and Plan  # Surgical Clearance:  Pt is here for surgical clearance for back surgery-reportedly kyphoplasty L1 #Osteoporosis with  compression fracture S: Patient has planned kyphoplasty with Dr. Shon Baton due to compression fracture.  She has a history of osteoporosis and is on Fosamax and calcitonin at present.  She states she is easily able to climb a flight of stairs without chest pain or shortness of breath or go up an incline.  In 2012 patient also had a normal Cardiolite.  Due to chest pain she eventually had a catheterization with no evidence of CAD per her report. A/P: Patient is medically maximized for surgery outside of blood pressure being elevated today-see discussion below.  I will go ahead and fill out form for her to proceed with surgery with stipulations as below -For osteoporosis-continue calcium, vitamin D, Fosamax, calcitonin  #hyperlipidemia S: Mild poorly  controlled on no statin Lab Results  Component Value Date   CHOL 189 08/08/2018   HDL 76.60 08/08/2018   LDLCALC 101 (H) 08/08/2018   TRIG 56.0 08/08/2018   CHOLHDL 2 08/08/2018   A/P: Hesitant to start statin given memory loss issues for primary prevention of heart attack or stroke-would likely only use if needed for secondary prevention.  #Elevated blood pressure reading #Edema S: Poorly controlled on no medication today.  Patient does have edema and takes Lasix for this-has been taking regularly lately.  Only trace edema on exam.  No recent weight gain.  Not wearing compression stockings regularly  BP Readings from Last 3 Encounters:  07/08/19 (!) 152/88  12/16/18 136/74  09/24/18 122/74  A/P: Patient without history of hypertension- she admits she is stressed about surgical clearance and upcoming surgery today.  We developed a plan for home monitoring and discussed if blood pressure remains high at home would need to consider medication for blood pressure management.  I recommended patient focus on wearing compression stockings over using Lasix daily-we will need to continue to monitor blood pressure as comes off Lasix now From AVS "  I want you to check blood pressure at home with goal for average to be <140/90 before going into surgery. Traditionally no issues with blood pressure and think anxiety about surgical clearance may have contributed to elevation today. If you note blood pressure is high please let me know as would want to get that controlled before surgery.  "  Recommended follow up: Already scheduled for October visit-wants to do flu shot at that time Future Appointments  Date Time Provider Department Center  07/09/2019  9:00 AM [facility] PAT 4 [facility] None  07/13/2019  9:25 AM [facility] None  07/31/2019 11:30 AM Van Clines, MD [facility] None  08/13/2019  9:40 AM Shelva Majestic, MD [facility]    Lab/Order associations:   ICD-10-CM   1. Age-related  osteoporosis without current pathological fracture  M81.0   2. Non-traumatic compression fracture of first lumbar vertebra, initial encounter (HCC)  M48.56XA   3. Hyperlipidemia, unspecified hyperlipidemia type  E78.5   4. Elevated blood pressure reading  R03.0   5. Edema, unspecified type  R60.9    Return precautions advised.  Tana Conch, MD

## 2019-07-08 NOTE — Pre-Procedure Instructions (Signed)
   Julia Palmer  07/08/2019    [facility], [place] [address] [place] [address] Phone: [phone] Fax: [place], [place] [address] [place] [address] Phone: [phone] Fax: [phone]   Your procedure is scheduled on Thursday, July 16, 2019  Report to [facility] Admitting at 8:30 A.M.  Call this number if you have problems the morning of surgery:  [phone]   Remember: Brush your teeth the morning of surgery with your regular toothpaste.  Do not eat or drink after midnight Wednesday, July 15, 2019    Take these medicines the morning of surgery with A SIP OF WATER : None  Stop taking Aspirin (unless otherwise advised by surgeon), vitamins, fish oil and herbal medications (Biotin, COLLAGEN, GLUCOSAMINE  and  Turmeric ).   Do not take any NSAIDs ie: Ibuprofen, Advil, Naproxen (Aleve), Motrin, BC and Goody Powder; stop now.    Do not wear jewelry, make-up or nail polish.  Do not wear lotions, powders, or perfumes, or deodorant.  Do not shave 48 hours prior to surgery.   Do not bring valuables to the hospital.  [facility] is not responsible for any belongings or valuables.  Contacts, dentures or bridgework may not be worn into surgery.  Leave your suitcase in the car.  After surgery it may be brought to your room. For patients admitted to the hospital, discharge time will be determined by your treatment team.  Patients discharged the day of surgery will not be allowed to drive home.   Special instructions: See " [facility] Preparing For Surgery" sheet.   Please read over the following fact sheets that you were given. Pain Booklet, Coughing and Deep Breathing, MRSA Information and Surgical Site Infection Prevention

## 2019-07-08 NOTE — Patient Instructions (Addendum)
Health Maintenance Due  Topic Date Due  . TETANUS/TDAP - need if you get a cut/scrape 09/14/2017  . INFLUENZA VACCINE - - will complete later in flu season (please let us know if you get this at another location so we can update your chart)  05/16/2019   I want you to check blood pressure at home with goal for average to be <140/90 before going into surgery. Traditionally no issues with blood pressure and think anxiety about surgical clearance may have contributed to elevation today. If you note blood pressure is high please let me know as would want to get that controlled before surgery.   We will submit form to Dr. Rolena Infante today

## 2019-07-09 ENCOUNTER — Encounter (HOSPITAL_COMMUNITY)
Admission: RE | Admit: 2019-07-09 | Discharge: 2019-07-09 | Disposition: A | Discharge status: Home / Self Care | Payer: Medicare Other | Source: Ambulatory Visit | Attending: Orthopedic Surgery | Admitting: Orthopedic Surgery

## 2019-07-09 ENCOUNTER — Encounter: Payer: Self-pay | Admitting: Family Medicine

## 2019-07-09 ENCOUNTER — Encounter (HOSPITAL_COMMUNITY): Payer: Self-pay

## 2019-07-09 ENCOUNTER — Other Ambulatory Visit: Payer: Self-pay

## 2019-07-09 DIAGNOSIS — Z01812 Encounter for preprocedural laboratory examination: Secondary | ICD-10-CM | POA: Insufficient documentation

## 2019-07-09 DIAGNOSIS — Z01818 Encounter for other preprocedural examination: Secondary | ICD-10-CM

## 2019-07-09 HISTORY — DX: Unspecified macular degeneration: H35.30

## 2019-07-09 HISTORY — DX: Unspecified osteoarthritis, unspecified site: M19.90

## 2019-07-09 HISTORY — DX: Wedge compression fracture of first lumbar vertebra, initial encounter for closed fracture: S32.010A

## 2019-07-09 LAB — CBC
HCT: 39.5 % (ref 36.0–46.0)
Hemoglobin: 12.4 g/dL (ref 12.0–15.0)
MCH: 31 pg (ref 26.0–34.0)
MCHC: 31.4 g/dL (ref 30.0–36.0)
MCV: 98.8 fL (ref 80.0–100.0)
Platelets: 228 10*3/uL (ref 150–400)
RBC: 4 MIL/uL (ref 3.87–5.11)
RDW: 13.5 % (ref 11.5–15.5)
WBC: 4.7 10*3/uL (ref 4.0–10.5)
nRBC: 0 % (ref 0.0–0.2)

## 2019-07-09 LAB — BASIC METABOLIC PANEL
Anion gap: 10 (ref 5–15)
BUN: 13 mg/dL (ref 8–23)
CO2: 27 mmol/L (ref 22–32)
Calcium: 9.2 mg/dL (ref 8.9–10.3)
Chloride: 104 mmol/L (ref 98–111)
Creatinine, Ser: 0.62 mg/dL (ref 0.44–1.00)
GFR calc Af Amer: >60 mL/min (ref >60)
GFR calc non Af Amer: >60 mL/min (ref >60)
Glucose, Bld: 87 mg/dL (ref 70–99)
Potassium: 3.5 mmol/L (ref 3.5–5.1)
Sodium: 141 mmol/L (ref 135–145)

## 2019-07-09 LAB — URINALYSIS, ROUTINE W REFLEX MICROSCOPIC
Bilirubin Urine: NEGATIVE
Glucose, UA: NEGATIVE mg/dL
Hgb urine dipstick: NEGATIVE
Ketones, ur: NEGATIVE mg/dL
Leukocytes,Ua: NEGATIVE
Nitrite: NEGATIVE
Protein, ur: NEGATIVE mg/dL
Specific Gravity, Urine: 1.02 (ref 1.005–1.030)
pH: 7 (ref 5.0–8.0)

## 2019-07-09 LAB — APTT: aPTT: 30 seconds (ref 24–36)

## 2019-07-09 LAB — SURGICAL PCR SCREEN
MRSA, PCR: NEGATIVE
Staphylococcus aureus: NEGATIVE

## 2019-07-09 LAB — PROTIME-INR
INR: 1.1 (ref 0.8–1.2)
Prothrombin Time: 13.9 seconds (ref 11.4–15.2)

## 2019-07-09 NOTE — Anesthesia Preprocedure Evaluation (Addendum)
Anesthesia Evaluation  Patient identified by MRN, date of birth, ID band Patient awake    Reviewed: Allergy & Precautions, NPO status , Patient's Chart, lab work & pertinent test results  Airway Mallampati: II  TM Distance: >3 FB Neck ROM: Full    Dental no notable dental hx.    Pulmonary neg pulmonary ROS,    Pulmonary exam normal breath sounds clear to auscultation       Cardiovascular negative cardio ROS Normal cardiovascular exam Rhythm:Regular Rate:Normal     Neuro/Psych negative neurological ROS  negative psych ROS   GI/Hepatic negative GI ROS, Neg liver ROS,   Endo/Other  negative endocrine ROS  Renal/GU negative Renal ROS  negative genitourinary   Musculoskeletal negative musculoskeletal ROS (+)   Abdominal   Peds negative pediatric ROS (+)  Hematology negative hematology ROS (+) Blood dyscrasia, anemia ,   Anesthesia Other Findings   Reproductive/Obstetrics negative OB ROS                                                              Anesthesia Evaluation  Patient identified by MRN, date of birth, ID band Patient awake    Reviewed: Allergy & Precautions, H&P , NPO status , Patient's Chart, lab work & pertinent test results  Airway Mallampati: II TM Distance: >3 FB Neck ROM: full    Dental  (+) Teeth Intact, Dental Advidsory Given   Pulmonary neg pulmonary ROS,  breath sounds clear to auscultation        Cardiovascular negative cardio ROS  Rhythm:regular Rate:Normal     Neuro/Psych negative neurological ROS  negative psych ROS   GI/Hepatic negative GI ROS, Neg liver ROS,   Endo/Other  negative endocrine ROS  Renal/GU negative Renal ROS     Musculoskeletal   Abdominal   Peds  Hematology  (+) anemia ,   Anesthesia Other Findings   Reproductive/Obstetrics negative OB ROS                          Anesthesia  Physical Anesthesia Plan  ASA: II  Anesthesia Plan: General LMA   Post-op Pain Management: MAC Combined w/ Regional for Post-op pain   Induction:   Airway Management Planned:   Additional Equipment:   Intra-op Plan:   Post-operative Plan:   Informed Consent: I have reviewed the patients History and Physical, chart, labs and discussed the procedure including the risks, benefits and alternatives for the proposed anesthesia with the patient or authorized representative who has indicated his/her understanding and acceptance.   Dental Advisory Given  Plan Discussed with: Anesthesiologist, CRNA and Surgeon  Anesthesia Plan Comments:         Anesthesia Quick Evaluation  Anesthesia Physical Anesthesia Plan  ASA: II  Anesthesia Plan: MAC   Post-op Pain Management:    Induction: Intravenous  PONV Risk Score and Plan: 2 and Ondansetron  Airway Management Planned: Mask and Nasal Cannula  Additional Equipment:   Intra-op Plan:   Post-operative Plan:   Informed Consent:   Plan Discussed with:   Anesthesia Plan Comments: (Cardiac eval in 2012 for chest tightness. She had a stress myoview  07/05/11 with no evidence of ischemia by EKG or on stress nuclear images. She ultimately had a cath 07/18/11 that shoed  clean coronaries. Her symptoms resolved and she was advised to follow up with cardiology as needed.  Seen by her PCP Dr. Yong Channel 07/08/19 for preop clearance. Her BP was noted to be elevated 152/88; she does not have a hx of HTN. He did clear the patient for surgery with he stipulation that she will monitor her BP at home and if it continues to be elevated she will call and likely start treatment.   Cardiac Cath 07/18/11:  1. The left main coronary artery bifurcated into the circumflex and       the left anterior descending artery.  There was no evidence of       disease in this vessel.   2. Circumflex artery was a moderate-sized vessel that gave off several        small-caliber obtuse marginal branches.  There was no disease in       this system.  This is a nondominant vessel.   3. The left anterior descending artery was a large vessel that coursed       to the apex and gave off a moderate-sized diagonal branch.  There       was no disease in this system.  There was a large septal       perforating branch.   4. The right coronary artery is a large dominant vessel with no       evidence of disease.   5. Left ventricular angiogram was performed in the RAO projection and       showed normal left ventricular systolic function with ejection       fraction of 55%.)      Anesthesia Quick Evaluation

## 2019-07-10 ENCOUNTER — Ambulatory Visit: Payer: Self-pay | Admitting: Orthopedic Surgery

## 2019-07-10 NOTE — H&P (Signed)
Subjective:    [name] is a pleasant 78 year old female with past medical history significant for osteoporosis (on alendronate) He was having several months of severe, debilitating back pain Despite conservative management. At this point, the patient would like to move forward with surgical intervention and she is scheduled for [facility]. We have obtained surgical clearance from the patient's primary care provider.  Patient Active Problem List   Diagnosis Date Noted  . Lumbar stress fracture 03/13/2019  . Memory loss 09/24/2018  . Hyperlipidemia 08/06/2017  . Mid back pain 08/06/2017  . LFT elevation 08/06/2017  . Macular degeneration 11/15/2014  . Raynaud phenomenon 08/24/2011  . Varicose veins of lower extremities with inflammation 08/21/2010  . Chest pain 01/12/2008  . Anemia 09/29/2007  . Osteoporosis 09/29/2007  . Edema 09/29/2007   Past Medical History:  Diagnosis Date  . Arthritis   . Compression fracture of first lumbar vertebra (HCC)   . Iron deficiency anemia   . Macular degeneration   . Osteoporosis   . Wears glasses     Past Surgical History:  Procedure Laterality Date  . BUNIONECTOMY WITH WEIL OSTEOTOMY Left 05/27/2014   Procedure: Left First Metatarsal Scarf Osteotomy, Second Metatarsal Weil, Modified Mcbride Bunionectomy, Hammertoe Correction;  Surgeon: Toni ArthursJohn Hewitt, MD;  Location: [facility];  Service: Orthopedics;  Laterality: Left;  . CARDIAC CATHETERIZATION     2012  . COLONOSCOPY    . FOOT SURGERY  2015,2000   bilateral  . GANGLION CYST EXCISION  1963   wrist-lt  . TUBAL LIGATION      Current Outpatient Medications  Medication Sig Dispense Refill Last Dose  . alendronate (FOSAMAX) 70 MG tablet Take 70 mg by mouth every Wednesday. Take with a full glass of water on an empty stomach.    Taking  . Ascorbic Acid (VITAMIN C) 1000 MG tablet Take 1,000 mg by mouth daily.     Taking  . Biotin 1000 MCG tablet  Take 1,000 mcg by mouth daily.     Taking  . calcitonin, salmon, (MIACALCIN/FORTICAL) 200 UNIT/ACT nasal spray Place 1 spray into alternate nostrils daily.   Taking  . Cholecalciferol (VITAMIN D-3 PO) Take 2,000 Units by mouth daily.    Taking  . COLLAGEN PO Take 500 mg by mouth daily.    Taking  . donepezil (ARICEPT) 10 MG tablet Take 1/2 tablet daily for 2 weeks, then increase to 1 tablet daily (Patient taking differently: Take 10 mg by mouth at bedtime. ) 30 tablet 11 Taking  . fish oil-omega-3 fatty acids 1000 MG capsule Take 1 g by mouth every other day.    Taking  . furosemide (LASIX) 40 MG tablet Take 1 tablet (40 mg total) by mouth as needed. (Patient taking differently: Take 40 mg by mouth daily. ) 30 tablet 4 Taking  . Magnesium 250 MG TABS Take 250 mg by mouth daily.    Taking  . Misc Natural Products (GLUCOSAMINE CHOND COMPLEX/MSM) TABS Take 1 tablet by mouth daily.   Taking  . Multiple Vitamins-Minerals (PRESERVISION AREDS PO) Take 1 capsule by mouth 2 (two) times daily.    Taking  . potassium chloride (K-DUR,KLOR-CON) 10 MEQ tablet take 1 tablet by mouth once daily as needed-with lasix (Patient taking differently: Take 10 mEq by mouth daily. ) 30 tablet 4 Taking  . Turmeric 500 MG CAPS Take 500 mg by mouth daily.    Taking   No current facility-administered medications for this visit.  Allergies  Allergen Reactions  . Codeine Nausea And Vomiting    REACTION: Nausea, Vomitting    Social History   Tobacco Use  . Smoking status: Never Smoker  . Smokeless tobacco: Never Used  Substance Use Topics  . Alcohol use: No    Family History  Problem Relation Age of Onset  . Dementia Mother   . Stroke Father        early 85s, smoker  . Kidney disease Brother   . Cancer Sister        breast    Review of Systems As stated in HPI  Objective:   General: AAOX3, well developed and well nourished, NAD Ambulation: normal gait pattern, uses no assistive device. Inspection: No  obvious deformity, scoleosis, kyphosis, loss of lordotic curve. Cardio: Regular rate and rhythm, no rubs, murmurs, or gallops. Pulm: Clear to auscultation bilaterally Abdomen: Bowel sounds 4, nontender, nondistended, no hepatosplenomegaly Palpation: Tender over spinous processes L1, L5/S1 and paraspinal muscles. Non-tender over SI joints.  AROM: - Lumbar: Pain is elicited with attempted range of motion of the lumbar spine - Knee: flexion and extension normal and pain free bilaterally. - Ankle: Dorsiflexion, plantarflexion, inversion, eversion normal and pain free.  Dermatomes: Lower extremity sensation to light touch intact bilaterally  Myotomes: - Hip Flexion: Left 5/5, Right 5/5 -Hip Adduction: Left 5/5, Right 5/5 - Knee Extension: Left 5/5, Right 5/5 - Knee Flexion: Left 5/5, Right 5/5 - Ankle Dorsiflextion: Left 5/5, Right 5/5 - Ankle Eversion: Left 5/5, Right 5/5 - Ankle Plantarflexion: Left 5/5, Right 5/5  Reflexes: - Patella: Left2+, Right 2+ - Achilles: Left2+, Right 2+ - Babinski: Left Ngative, Right Negative - Clonus: Negative  Special Tests: - Straight Leg Raise: Left Negative, Right Negative - FABER: Left: Negative, Right Negative  PV: Extremities warm and well profused. Posterior and dorsalis pedis pulse 2+ bilaterally, No pitting Edema, discoloration, calf tenderness, or palpable cords. Homan's negative bilaterally.  X-Ray impression: L1 compression fracture (age indeterminate), DDD throughout lumbar spine. No significant curvature or listhes. There is been no significant collapse of the L1 compression fracture since we have been monitoring it.  MRI impression: Chronic L1 Benign compression fracture  Assessment:    Idil indicates that her pain over the last several weeks is gotten significantly worse. Initially she thought she was improving but her overall quality-of-life is significantly hindered. At this point she like to move forward with a kyphoplasty of  L1. Given the osteoporotic fracture that she has and the recurrence of pain despite appropriate conservative management I think it is reasonable to move forward with surgery. I have gone over the surgical procedure in great detail with the patient and her husband and all their questions were encouraged and addressed.   Plan:   We will get preoperative medical clearance from her primary care physician and plan on moving forward with L1 kyphoplasty using IV sedation and local anesthesia.  Risks of surgery include: Infection, bleeding, death, stroke, paralysis, nerve damage, leak of cement, need for additional surgery including open decompression. Ongoing or worse pain.  Goals of surgery: Reduction in pain, and improvement in quality of life  We have also discussed the post-operative recovery period to include: bathing/showering restrictions, wound healing, activity (and driving) restrictions, medications/pain mangement.  We have also discussed post-operative redflags to include: signs and symptoms of postoperative infection, DVT/PE.  We have obtained preoperative clearance from the patient's primary care provider.  All the patient's and husband's questions have been answered.  Follow-up:  2 weeks after surgery

## 2019-07-10 NOTE — H&P (Deleted)
  The note originally documented on this encounter has been moved the the encounter in which it belongs.  

## 2019-07-13 ENCOUNTER — Other Ambulatory Visit (HOSPITAL_COMMUNITY)
Admission: RE | Admit: 2019-07-13 | Discharge: 2019-07-13 | Disposition: A | Discharge status: Home / Self Care | Payer: Medicare Other | Source: Ambulatory Visit | Attending: Orthopedic Surgery | Admitting: Orthopedic Surgery

## 2019-07-13 DIAGNOSIS — Z01812 Encounter for preprocedural laboratory examination: Secondary | ICD-10-CM | POA: Insufficient documentation

## 2019-07-13 DIAGNOSIS — Z20828 Contact with and (suspected) exposure to other viral communicable diseases: Secondary | ICD-10-CM | POA: Diagnosis not present

## 2019-07-14 LAB — NOVEL CORONAVIRUS, NAA (HOSP ORDER, SEND-OUT TO REF LAB; TAT 18-24 HRS): SARS-CoV-2, NAA: NOT DETECTED

## 2019-07-16 ENCOUNTER — Encounter (HOSPITAL_COMMUNITY): Payer: Self-pay | Admitting: Certified Registered"

## 2019-07-16 ENCOUNTER — Ambulatory Visit (HOSPITAL_COMMUNITY): Payer: Medicare Other

## 2019-07-16 ENCOUNTER — Ambulatory Visit (HOSPITAL_COMMUNITY): Payer: Medicare Other | Admitting: Physician Assistant

## 2019-07-16 ENCOUNTER — Encounter (HOSPITAL_COMMUNITY)
Admission: RE | Disposition: A | Discharge status: Home / Self Care | Payer: Self-pay | Source: Home / Self Care | Attending: Orthopedic Surgery

## 2019-07-16 ENCOUNTER — Other Ambulatory Visit: Payer: Self-pay | Admitting: Orthopedic Surgery

## 2019-07-16 ENCOUNTER — Ambulatory Visit (HOSPITAL_COMMUNITY)
Admission: RE | Admit: 2019-07-16 | Discharge: 2019-07-16 | Disposition: A | Discharge status: Home / Self Care | Payer: Medicare Other | Attending: Orthopedic Surgery | Admitting: Orthopedic Surgery

## 2019-07-16 ENCOUNTER — Ambulatory Visit (HOSPITAL_COMMUNITY): Payer: Medicare Other | Admitting: Anesthesiology

## 2019-07-16 ENCOUNTER — Other Ambulatory Visit: Payer: Self-pay

## 2019-07-16 DIAGNOSIS — Z885 Allergy status to narcotic agent status: Secondary | ICD-10-CM | POA: Diagnosis not present

## 2019-07-16 DIAGNOSIS — M4856XA Collapsed vertebra, not elsewhere classified, lumbar region, initial encounter for fracture: Secondary | ICD-10-CM | POA: Diagnosis not present

## 2019-07-16 DIAGNOSIS — I8310 Varicose veins of unspecified lower extremity with inflammation: Secondary | ICD-10-CM | POA: Diagnosis not present

## 2019-07-16 DIAGNOSIS — E785 Hyperlipidemia, unspecified: Secondary | ICD-10-CM | POA: Diagnosis not present

## 2019-07-16 DIAGNOSIS — H3530 Unspecified macular degeneration: Secondary | ICD-10-CM | POA: Diagnosis not present

## 2019-07-16 DIAGNOSIS — Z981 Arthrodesis status: Secondary | ICD-10-CM | POA: Diagnosis not present

## 2019-07-16 DIAGNOSIS — Z79899 Other long term (current) drug therapy: Secondary | ICD-10-CM | POA: Diagnosis not present

## 2019-07-16 DIAGNOSIS — M8088XA Other osteoporosis with current pathological fracture, vertebra(e), initial encounter for fracture: Secondary | ICD-10-CM | POA: Insufficient documentation

## 2019-07-16 DIAGNOSIS — M1990 Unspecified osteoarthritis, unspecified site: Secondary | ICD-10-CM | POA: Insufficient documentation

## 2019-07-16 DIAGNOSIS — Z419 Encounter for procedure for purposes other than remedying health state, unspecified: Secondary | ICD-10-CM

## 2019-07-16 DIAGNOSIS — D649 Anemia, unspecified: Secondary | ICD-10-CM | POA: Diagnosis not present

## 2019-07-16 DIAGNOSIS — S32010A Wedge compression fracture of first lumbar vertebra, initial encounter for closed fracture: Secondary | ICD-10-CM | POA: Diagnosis not present

## 2019-07-16 DIAGNOSIS — M8008XA Age-related osteoporosis with current pathological fracture, vertebra(e), initial encounter for fracture: Secondary | ICD-10-CM | POA: Diagnosis not present

## 2019-07-16 HISTORY — PX: KYPHOPLASTY: SHX5884

## 2019-07-16 SURGERY — KYPHOPLASTY
Anesthesia: Monitor Anesthesia Care

## 2019-07-16 MED ORDER — OXYCODONE HCL 5 MG PO TABS: 5.0000 mg | ORAL_TABLET | Freq: Once | ORAL | Status: DC | PRN

## 2019-07-16 MED ORDER — LACTATED RINGERS IV SOLN
INTRAVENOUS | Status: DC
  Administered 2019-07-16: 09:00:00 via INTRAVENOUS

## 2019-07-16 MED ORDER — ONDANSETRON HCL 4 MG PO TABS: 4.0000 mg | ORAL_TABLET | Freq: Three times a day (TID) | ORAL | 0 refills | Status: AC | PRN

## 2019-07-16 MED ORDER — METHOCARBAMOL 500 MG PO TABS: 500.0000 mg | ORAL_TABLET | Freq: Three times a day (TID) | ORAL | 0 refills | Status: AC | PRN

## 2019-07-16 MED ORDER — BUPIVACAINE-EPINEPHRINE (PF) 0.25% -1:200000 IJ SOLN
INTRAMUSCULAR | Status: AC
  Filled 2019-07-16: qty 20

## 2019-07-16 MED ORDER — ONDANSETRON HCL 4 MG/2ML IJ SOLN
INTRAMUSCULAR | Status: AC
  Filled 2019-07-16: qty 2

## 2019-07-16 MED ORDER — PROPOFOL 500 MG/50ML IV EMUL
INTRAVENOUS | Status: DC | PRN
  Administered 2019-07-16: 50 ug/kg/min via INTRAVENOUS

## 2019-07-16 MED ORDER — IOPAMIDOL (ISOVUE-300) INJECTION 61%
INTRAVENOUS | Status: DC | PRN
  Administered 2019-07-16: 11:00:00 50 mL

## 2019-07-16 MED ORDER — ACETAMINOPHEN 325 MG PO TABS: 325.0000 mg | ORAL_TABLET | ORAL | Status: DC | PRN

## 2019-07-16 MED ORDER — CEFAZOLIN SODIUM-DEXTROSE 2-4 GM/100ML-% IV SOLN
INTRAVENOUS | Status: AC
  Filled 2019-07-16: qty 100

## 2019-07-16 MED ORDER — ACETAMINOPHEN 160 MG/5ML PO SOLN: 325.0000 mg | ORAL | Status: DC | PRN

## 2019-07-16 MED ORDER — TRAMADOL HCL 50 MG PO TABS: 50.0000 mg | ORAL_TABLET | Freq: Four times a day (QID) | ORAL | 0 refills | Status: AC | PRN

## 2019-07-16 MED ORDER — FENTANYL CITRATE (PF) 250 MCG/5ML IJ SOLN
INTRAMUSCULAR | Status: AC
  Filled 2019-07-16: qty 5

## 2019-07-16 MED ORDER — BUPIVACAINE LIPOSOME 1.3 % IJ SUSP
INTRAMUSCULAR | Status: DC | PRN
  Administered 2019-07-16: 20 mL

## 2019-07-16 MED ORDER — GLYCOPYRROLATE PF 0.2 MG/ML IJ SOSY
PREFILLED_SYRINGE | INTRAMUSCULAR | Status: AC
  Filled 2019-07-16: qty 1

## 2019-07-16 MED ORDER — FENTANYL CITRATE (PF) 100 MCG/2ML IJ SOLN
INTRAMUSCULAR | Status: DC | PRN
  Administered 2019-07-16: 25 ug via INTRAVENOUS
  Administered 2019-07-16: 50 ug via INTRAVENOUS

## 2019-07-16 MED ORDER — BUPIVACAINE-EPINEPHRINE 0.25% -1:200000 IJ SOLN
INTRAMUSCULAR | Status: DC | PRN
  Administered 2019-07-16: 30 mL

## 2019-07-16 MED ORDER — DEXAMETHASONE SODIUM PHOSPHATE 10 MG/ML IJ SOLN
INTRAMUSCULAR | Status: AC
  Filled 2019-07-16: qty 1

## 2019-07-16 MED ORDER — ONDANSETRON HCL 4 MG/2ML IJ SOLN: 4.0000 mg | Freq: Once | INTRAMUSCULAR | Status: DC | PRN

## 2019-07-16 MED ORDER — 0.9 % SODIUM CHLORIDE (POUR BTL) OPTIME
TOPICAL | Status: DC | PRN
  Administered 2019-07-16: 11:00:00 1000 mL

## 2019-07-16 MED ORDER — MEPERIDINE HCL 25 MG/ML IJ SOLN: 6.2500 mg | INTRAMUSCULAR | Status: DC | PRN

## 2019-07-16 MED ORDER — OXYCODONE HCL 5 MG/5ML PO SOLN: 5.0000 mg | Freq: Once | ORAL | Status: DC | PRN

## 2019-07-16 MED ORDER — BUPIVACAINE-EPINEPHRINE (PF) 0.25% -1:200000 IJ SOLN
INTRAMUSCULAR | Status: AC
  Filled 2019-07-16: qty 10

## 2019-07-16 MED ORDER — CEFAZOLIN SODIUM-DEXTROSE 2-4 GM/100ML-% IV SOLN
2.0000 g | INTRAVENOUS | Status: AC
  Administered 2019-07-16: 2 g via INTRAVENOUS

## 2019-07-16 MED ORDER — FENTANYL CITRATE (PF) 100 MCG/2ML IJ SOLN: 25.0000 ug | INTRAMUSCULAR | Status: DC | PRN

## 2019-07-16 MED ORDER — ONDANSETRON HCL 4 MG/2ML IJ SOLN
INTRAMUSCULAR | Status: DC | PRN
  Administered 2019-07-16: 4 mg via INTRAVENOUS

## 2019-07-16 SURGICAL SUPPLY — 46 items
ADH SKN CLS APL DERMABOND .7 (GAUZE/BANDAGES/DRESSINGS) ×1
BLADE SURG 15 STRL LF DISP TIS (BLADE) ×1 IMPLANT
BLADE SURG 15 STRL SS (BLADE) ×3
BNDG ADH 1X3 SHEER STRL LF (GAUZE/BANDAGES/DRESSINGS) ×6 IMPLANT
BNDG ADH THN 3X1 STRL LF (GAUZE/BANDAGES/DRESSINGS) ×2
CEMENT KYPHON CX01A KIT/MIXER (Cement) ×5 IMPLANT
COVER SURGICAL LIGHT HANDLE (MISCELLANEOUS) ×3 IMPLANT
COVER WAND RF STERILE (DRAPES) ×3 IMPLANT
CURETTE EXPRESS SZ2 7MM (INSTRUMENTS) IMPLANT
CURETTE WEDGE 8.5MM KYPHX (MISCELLANEOUS) IMPLANT
CURRETTE EXPRESS SZ2 7MM (INSTRUMENTS) ×3
DERMABOND ADVANCED (GAUZE/BANDAGES/DRESSINGS) ×2
DERMABOND ADVANCED .7 DNX12 (GAUZE/BANDAGES/DRESSINGS) ×1 IMPLANT
DRAPE C-ARM 42X72 X-RAY (DRAPES) ×6 IMPLANT
DRAPE INCISE IOBAN 66X45 STRL (DRAPES) ×3 IMPLANT
DRAPE LAPAROTOMY T 102X78X121 (DRAPES) ×3 IMPLANT
DRAPE SURG 17X23 STRL (DRAPES) ×3 IMPLANT
DRAPE U-SHAPE 47X51 STRL (DRAPES) ×3 IMPLANT
DRAPE WARM FLUID 44X44 (DRAPES) ×3 IMPLANT
DURAPREP 26ML APPLICATOR (WOUND CARE) ×3 IMPLANT
GLOVE BIO SURGEON STRL SZ 6.5 (GLOVE) ×2 IMPLANT
GLOVE BIO SURGEONS STRL SZ 6.5 (GLOVE) ×1
GLOVE BIOGEL PI IND STRL 6.5 (GLOVE) ×1 IMPLANT
GLOVE BIOGEL PI IND STRL 8.5 (GLOVE) ×1 IMPLANT
GLOVE BIOGEL PI INDICATOR 6.5 (GLOVE) ×2
GLOVE BIOGEL PI INDICATOR 8.5 (GLOVE) ×2
GLOVE SS BIOGEL STRL SZ 8.5 (GLOVE) ×1 IMPLANT
GLOVE SUPERSENSE BIOGEL SZ 8.5 (GLOVE) ×2
GOWN STRL REUS W/ TWL LRG LVL3 (GOWN DISPOSABLE) ×2 IMPLANT
GOWN STRL REUS W/TWL 2XL LVL3 (GOWN DISPOSABLE) ×3 IMPLANT
GOWN STRL REUS W/TWL LRG LVL3 (GOWN DISPOSABLE) ×6
KIT BASIN OR (CUSTOM PROCEDURE TRAY) ×3 IMPLANT
KIT TURNOVER KIT B (KITS) ×3 IMPLANT
NDL SPNL 18GX3.5 QUINCKE PK (NEEDLE) ×2 IMPLANT
NEEDLE SPNL 18GX3.5 QUINCKE PK (NEEDLE) ×6 IMPLANT
NS IRRIG 1000ML POUR BTL (IV SOLUTION) ×3 IMPLANT
PACK SURGICAL SETUP 50X90 (CUSTOM PROCEDURE TRAY) ×3 IMPLANT
PACK UNIVERSAL I (CUSTOM PROCEDURE TRAY) ×3 IMPLANT
PAD ARMBOARD 7.5X6 YLW CONV (MISCELLANEOUS) ×6 IMPLANT
SPONGE LAP 4X18 RFD (DISPOSABLE) ×3 IMPLANT
SUT MNCRL AB 3-0 PS2 18 (SUTURE) ×3 IMPLANT
SYR CONTROL 10ML LL (SYRINGE) ×3 IMPLANT
TOWEL GREEN STERILE (TOWEL DISPOSABLE) ×3 IMPLANT
TRAY KYPHOPAK 15/3 ONESTEP 1ST (MISCELLANEOUS) ×2 IMPLANT
TRAY KYPHOPAK 20/3 ONESTEP 1ST (MISCELLANEOUS) IMPLANT
WATER STERILE IRR 1000ML POUR (IV SOLUTION) ×3 IMPLANT

## 2019-07-16 NOTE — Anesthesia Postprocedure Evaluation (Signed)
Anesthesia Post Note  Patient: Julia Palmer  Procedure(s) Performed: LUMBAR 1 KYPHOPLASTY (N/A )     Patient location during evaluation: PACU Anesthesia Type: MAC Level of consciousness: awake and alert Pain management: pain level controlled Vital Signs Assessment: post-procedure vital signs reviewed and stable Respiratory status: spontaneous breathing, nonlabored ventilation, respiratory function stable and patient connected to nasal cannula oxygen Cardiovascular status: stable and blood pressure returned to baseline Postop Assessment: no apparent nausea or vomiting Anesthetic complications: no    Last Vitals:  Vitals:   07/16/19 1123 07/16/19 1138  BP: 136/65 139/74  Pulse: 77 75  Resp: 15 17  Temp:    SpO2: 100% 100%    Last Pain:  Vitals:   07/16/19 1138  TempSrc:   PainSc: 1                  Bensen Chadderdon

## 2019-07-16 NOTE — Op Note (Signed)
Operative report  Preop diagnosis: L1 osteoporotic compression fracture  Postoperative diagnosis: Same  Operative procedure: L1 kyphoplasty  Complications: None  Indications: This is a very pleasant 78 year old lady who presented to me with severe increase in her chronic low back pain.  Patient reports that the pain is debilitating and significantly affected her quality of life.  Imaging studies confirm an L1 osteoporotic compression fracture.  As a result of the fragility fracture elected to move forward with a kyphoplasty to help alleviate her pain and improve her quality of life.  Operative report: Patient was brought the operating room placed prone on the operating room table.  After successful induction of IV sedation the back was prepped and draped in a standard fashion.  Timeout was taken to confirm patient procedure and all other important data.  Fluoroscopic view monitors were placed 1 in the AP the other in the lateral plane and identified the L1 fracture.  The lateral border of the L1 pedicles were marked and infiltrated with quarter percent Marcaine with epinephrine.  Once I confirmed adequate local anesthesia I made a small stab incision and then advanced the Jamshidi needle down to the lateral aspect of the L1 pedicle.  I confirmed the position in the AP and lateral planes and then advanced the Jamshidi needle through the pedicle and into the vertebral body.  As I near the medial wall of the pedicle on the AP view I confirmed I was just beyond the posterior wall of the vertebral body in the lateral plane.  Once trajectory and position was confirmed I advanced into the vertebral body I repeated this exact same procedure on the contralateral side.  I then used the drill and the curette to prepare the bone for the inflatable bone tamp.  The inflatable balloons were placed and inflated.  I then deflated the balloon and then inserted a total of 2 cc of cement on each side.  I had excellent fill  spanning from the superior to inferior endplate.  I also was able to have the cement across the midline.  Once an adequate fill of cement in the vertebral body I allowed it to harden and then I remove the Jamshidi needles.  These incision sites were closed with a simple 3-0 Monocryl stitch and the skin area was cleaned.  I then injected a generous amount of Marcaine mixed with Exparel for improved postoperative analgesia.  A Band-Aid was applied after the Dermabond was placed and the patient was transferred to the PACU without incident.  The end of the case all needle sponge counts were correct.  There were no adverse intraoperative events.

## 2019-07-16 NOTE — Anesthesia Procedure Notes (Signed)
Procedure Name: MAC Date/Time: 07/16/2019 10:23 AM Performed by: Imagene Riches, CRNA Pre-anesthesia Checklist: Patient identified, Emergency Drugs available, Suction available and Patient being monitored Patient Re-evaluated:Patient Re-evaluated prior to induction Oxygen Delivery Method: Nasal cannula

## 2019-07-16 NOTE — H&P (Signed)
Addendum H&P  There is been no change in the patient's clinical exam since her last office visit of 07/10/2019.  She continues to have low back pain radiating into the paraspinal region.  Clinical exam is consistent with lumbar spine pain due to osteoporotic compression fracture.  Surgical plan is to move forward with an L1 kyphoplasty.  I have gone over the surgery as well as the risks and benefits with the patient again and she is expressed an understanding and a desire to continue to move forward with the surgery.  All of her questions were encouraged and addressed.

## 2019-07-16 NOTE — Transfer of Care (Signed)
Immediate Anesthesia Transfer of Care Note  Patient: Julia Palmer  Procedure(s) Performed: LUMBAR 1 KYPHOPLASTY (N/A )  Patient Location: PACU  Anesthesia Type:MAC  Level of Consciousness: awake, alert  and oriented  Airway & Oxygen Therapy: Patient Spontanous Breathing and Patient connected to nasal cannula oxygen  Post-op Assessment: Report given to RN and Post -op Vital signs reviewed and stable  Post vital signs: Reviewed and stable  Last Vitals:  Vitals Value Taken Time  BP 144/76 07/16/19 1108  Temp    Pulse 92 07/16/19 1108  Resp 14 07/16/19 1108  SpO2 100 % 07/16/19 1108  Vitals shown include unvalidated device data.  Last Pain:  Vitals:   07/16/19 0854  TempSrc:   PainSc: 5       Patients Stated Pain Goal: 3 (65/53/74 8270)  Complications: No apparent anesthesia complications

## 2019-07-16 NOTE — Brief Op Note (Signed)
07/16/2019  11:18 AM  PATIENT:  Julia Palmer  78 y.o. female  PRE-OPERATIVE DIAGNOSIS:  L1 compression fracture  POST-OPERATIVE DIAGNOSIS:  L1 compression fracture  PROCEDURE:  Procedure(s) with comments: LUMBAR 1 KYPHOPLASTY (N/A) - 60 mins  SURGEON:  Surgeon(s) and Role:    Melina Schools, MD - Primary  PHYSICIAN ASSISTANT:   ASSISTANTS: none   ANESTHESIA:   local and IV sedation  EBL:  10 mL   BLOOD ADMINISTERED:none  DRAINS: none   LOCAL MEDICATIONS USED:  MARCAINE    and OTHER exparel  SPECIMEN:  No Specimen  DISPOSITION OF SPECIMEN:  N/A  COUNTS:  YES  TOURNIQUET:  * No tourniquets in log *  DICTATION: .Dragon Dictation  PLAN OF CARE: Discharge to home after PACU  PATIENT DISPOSITION:  PACU - hemodynamically stable.

## 2019-07-17 ENCOUNTER — Encounter (HOSPITAL_COMMUNITY): Payer: Self-pay | Admitting: Orthopedic Surgery

## 2019-07-21 ENCOUNTER — Encounter: Payer: Self-pay | Admitting: Family Medicine

## 2019-07-23 ENCOUNTER — Other Ambulatory Visit: Payer: Medicare Other

## 2019-07-31 ENCOUNTER — Other Ambulatory Visit: Payer: Self-pay

## 2019-07-31 ENCOUNTER — Ambulatory Visit: Payer: Medicare Other | Admitting: Neurology

## 2019-07-31 ENCOUNTER — Encounter: Payer: Self-pay | Admitting: Neurology

## 2019-07-31 VITALS — BP 138/76 | HR 82 | Ht 64.0 in | Wt 140.0 lb

## 2019-07-31 DIAGNOSIS — F03A0 Unspecified dementia, mild, without behavioral disturbance, psychotic disturbance, mood disturbance, and anxiety: Secondary | ICD-10-CM

## 2019-07-31 DIAGNOSIS — F0390 Unspecified dementia without behavioral disturbance: Secondary | ICD-10-CM | POA: Diagnosis not present

## 2019-07-31 MED ORDER — DONEPEZIL HCL 10 MG PO TABS: ORAL_TABLET | ORAL | 11 refills | Status: DC

## 2019-07-31 NOTE — Progress Notes (Signed)
NEUROLOGY FOLLOW UP OFFICE NOTE  DIAN LAPRADE [id number] 21-Jul-1941  HISTORY OF PRESENT ILLNESS: I had the pleasure of seeing Julia Palmer in follow-up in the neurology clinic on 07/31/2019.  The patient was last seen 7 months ago for mild dementia. She is again accompanied by her husband who helps supplement the history today. MOCA score 21/30 in March 2020. At that time, they were also reporting visual hallucinations and REM behavior disorder, possible Lewy Body Dementia. No other parkinsonian signs. She was started on Donepezil 10mg  daily which she is tolerating without side effects. Records and images were personally reviewed where available.  She had an MRI brain in June 2020 which reported mild frontal atrophy, mild chronic microvascular disease, no acute changes. She was started on Donepezil 10mg  daily which she is tolerating without side effects. Since her last visit, she feels she is doing the same. Her husband reports she is doing well. She had back surgery on 10/1 and has not been driving since then, but prior to this she denies getting lost driving. She continues to manage her medications without difficulties. She has not left the stove on. She is independent with dressing and bathing. The main thing her husband has noticed is confusion, she gets confused with buttons to press for their chair lift. She loses her phone a lot. She still reports hallucinations, she would see a dog's shadow go by the window, or children digging up their mailbox. Her husband was previously reporting acting out her dreams and hollering at night, she is not doing this as frequently. She mostly has tremors when anxious. She denies any headaches, dizziness, vision changes, bowel/bladder dysfunction, no falls. She is pain-free this morning.    History on Initial Assessment 12/16/2018: This is a pleasant 78 year old right-handed woman with a history of osteoporosis presenting for evaluation of worsening memory.   She states her memory "can leave something to be desired." She has noticed word-finding difficulties, she tries to answer but sometimes the words don't come until 30 minutes later. Her husband started noticing changes around a year ago with general confusion "with a little bit of everything." She could not get the checkbooks balanced so he took over finances. Sometimes she is not sure how to write a check, worse the past 6 months. She would not remember where she left her phone or pocketbook. She has had difficulties using the TV remote control and the auto seat adjustment in her car, pushing a button and saying it was not doing what she wants for him, but working for her husband. Her husband does majority of driving. She is independent with dressing and bathing but occasionally may not remember what she did with the clothes she took off. She spends a lot of time on the computer but would get confused and frustrated. She prints out emails for him, sometimes making 2 to 3 copies because she forgets she already did it. She used to knit all the time but for a time forgot how to do it. Her husband has watched her knit and saw her take an hour to do it, she would leave holes and repeat the process, frustrating her. She manages her own medications and denies missing medications.   Her husband has noticed that over the past 5 years she would holller out loudly in her sleep, almost like someone is chasing her. She would act out her dreams. He got her an over the counter sleep aid with melatonin and has noticed  these quiet down. Over the past 4-5 months, she has had visual hallucinations seeing a lady with 3 children outside on the street, stating they are there every morning. Her husband and neighbor have not seen these people. She has occasionally seen something black go behind her husband when he moved his arm. She has never been able to smell for years. No tremors. She has had brief double vision, one time she was  knitting and saw 3 needles. She has occasional diarrhea, no constipation. She has Reynaud's syndrome with tingling in her fingers when cold. She has back pain. She rarely has headaches, no dizziness, dysarthria/dysphagia, neck pain, focal numbness/tingling/weakness, no falls. No paranoia, she is a little more irritable and frustrated with herself. Her mother had dementia. No history of significant head injury or alcohol use.   PAST MEDICAL HISTORY: Past Medical History:  Diagnosis Date  . Arthritis   . Compression fracture of first lumbar vertebra (HCC)   . Iron deficiency anemia   . Macular degeneration   . Osteoporosis   . Wears glasses     MEDICATIONS: Current Outpatient Medications on File Prior to Visit  Medication Sig Dispense Refill  . alendronate (FOSAMAX) 70 MG tablet Take 70 mg by mouth every Wednesday. Take with a full glass of water on an empty stomach.     . donepezil (ARICEPT) 10 MG tablet Take 1/2 tablet daily for 2 weeks, then increase to 1 tablet daily (Patient taking differently: Take 10 mg by mouth at bedtime. ) 30 tablet 11  . furosemide (LASIX) 40 MG tablet Take 1 tablet (40 mg total) by mouth as needed. (Patient taking differently: Take 40 mg by mouth daily. ) 30 tablet 4  . Magnesium 250 MG TABS Take 250 mg by mouth daily.     . potassium chloride (K-DUR,KLOR-CON) 10 MEQ tablet take 1 tablet by mouth once daily as needed-with lasix (Patient taking differently: Take 10 mEq by mouth daily. ) 30 tablet 4   No current facility-administered medications on file prior to visit.     ALLERGIES: Allergies  Allergen Reactions  . Codeine Nausea And Vomiting    REACTION: Nausea, Vomitting    FAMILY HISTORY: Family History  Problem Relation Age of Onset  . Dementia Mother   . Stroke Father        early 53s, smoker  . Kidney disease Brother   . Cancer Sister        breast    SOCIAL HISTORY: Social History   Socioeconomic History  . Marital status: Married     Spouse name: Not on file  . Number of children: 1  . Years of education: Not on file  . Highest education level: Not on file  Occupational History  . Occupation: Engineer, production: OTHER  Social Needs  . Financial resource strain: Not on file  . Food insecurity    Worry: Not on file    Inability: Not on file  . Transportation needs    Medical: Not on file    Non-medical: Not on file  Tobacco Use  . Smoking status: Never Smoker  . Smokeless tobacco: Never Used  Substance and Sexual Activity  . Alcohol use: No  . Drug use: No  . Sexual activity: Yes    Partners: Male  Lifestyle  . Physical activity    Days per week: Not on file    Minutes per session: Not on file  . Stress: Not on file  Relationships  . Social Herbalist on phone: Not on file    Gets together: Not on file    Attends religious service: Not on file    Active member of club or organization: Not on file    Attends meetings of clubs or organizations: Not on file    Relationship status: Not on file  . Intimate partner violence    Fear of current or ex partner: Not on file    Emotionally abused: Not on file    Physically abused: Not on file    Forced sexual activity: Not on file  Other Topics Concern  . Not on file  Social History Narrative   Pt is R handed   Lives in 2 story home with her husband, Clair Gulling   Married 1964   1 adult child   Some college education   Retired Forensic psychologist    REVIEW OF SYSTEMS: Constitutional: No fevers, chills, or sweats, no generalized fatigue, change in appetite Eyes: No visual changes, double vision, eye pain Ear, nose and throat: No hearing loss, ear pain, nasal congestion, sore throat Cardiovascular: No chest pain, palpitations Respiratory:  No shortness of breath at rest or with exertion, wheezes GastrointestinaI: No nausea, vomiting, diarrhea, abdominal pain, fecal incontinence Genitourinary:  No dysuria, urinary retention or frequency  Musculoskeletal:  No neck pain, back pain Integumentary: No rash, pruritus, skin lesions Neurological: as above Psychiatric: No depression, insomnia, anxiety Endocrine: No palpitations, fatigue, diaphoresis, mood swings, change in appetite, change in weight, increased thirst Hematologic/Lymphatic:  No anemia, purpura, petechiae. Allergic/Immunologic: no itchy/runny eyes, nasal congestion, recent allergic reactions, rashes  PHYSICAL EXAM: Vitals:   07/31/19 1119  BP: 138/76  Pulse: 82  SpO2: 99%   General: No acute distress. She has hypomimia with reduced eye blink Head:  Normocephalic/atraumatic Skin/Extremities: No rash, no edema Neurological Exam: alert and oriented to person, place, and time. No aphasia or dysarthria. Fund of knowledge is appropriate.  Recent and remote memory are intact.  Attention and concentration are normal.   [employer] Mental Exam 07/31/2019  Weekday Correct 1  Current year 1  What state are we in? 1  Amount spent 1  Amount left 0  # of Animals 3  5 objects recall 3  Number series 1  Hour markers 2  Time correct 1  Placed X in triangle correctly 1  Largest Figure 1  Name of female 2  Date back to work 0  Type of work 2  State she lived in 0  Total score 20   Cranial nerves: Pupils equal, round, reactive to light.  Extraocular movements intact with no nystagmus. Visual fields full. Facial sensation intact. No facial asymmetry. Tongue, uvula, palate midline.  Motor: Bulk and tone normal, no cogwheeling, muscle strength 5/5 throughout with no pronator drift. Deep tendon reflexes 2+ throughout, toes downgoing.  Finger to nose testing intact. Able to rise with arms crossed over chest. Gait narrow-based and steady, able to tandem walk adequately.  Romberg negative.No postural instability. Good finger and foot taps.    IMPRESSION: This is a pleasant 78 yo RH woman with a history of osteoporosis with worsening memory,visual hallucinations, and  symptoms suggestive of REM behavioral disturbance, raising concern for Lewy Body dementia.  MRI brain in June 2020 reported mild frontal atrophy, mild chronic microvascular disease, no acute changes. SLUMS score today 20/30. She is on Donepezil 10mg  daily. They report symptoms are overall stable, she still has occasional visual hallucinations,  less RBD. Continue to monitor mood. Continue to monitor driving. Follow-up in 6 months, they know to call for any changes.   Thank you for allowing me to participate in her care.  Please do not hesitate to call for any questions or concerns.    Patrcia DollyKaren , M.D.   CC: Dr. Durene CalHunter

## 2019-07-31 NOTE — Patient Instructions (Addendum)
1. Continue Donepezil 10mg  daily  2. Continue to monitor mood/frustration levels, call our office for any changes  3. Follow-up in 6 months  FALL PRECAUTIONS: Be cautious when walking. Scan the area for obstacles that may increase the risk of trips and falls. When getting up in the mornings, sit up at the edge of the bed for a few minutes before getting out of bed. Consider elevating the bed at the head end to avoid drop of blood pressure when getting up. Walk always in a well-lit room (use night lights in the walls). Avoid area rugs or power cords from appliances in the middle of the walkways. Use a walker or a cane if necessary and consider physical therapy for balance exercise. Get your eyesight checked regularly.   HOME SAFETY: Consider the safety of the kitchen when operating appliances like stoves, microwave oven, and blender. Consider having supervision and share cooking responsibilities until no longer able to participate in those. Accidents with firearms and other hazards in the house should be identified and addressed as well.  DRIVING: Regarding driving, in patients with progressive memory problems, driving will be impaired. We advise to have someone else do the driving if trouble finding directions or if minor accidents are reported. Independent driving assessment is available to determine safety of driving.  ABILITY TO BE LEFT ALONE: If patient is unable to contact 911 operator, consider using LifeLine, or when the need is there, arrange for someone to stay with patients. Smoking is a fire hazard, consider supervision or cessation. Risk of wandering should be assessed by caregiver and if detected at any point, supervision and safe proof recommendations should be instituted.  MEDICATION SUPERVISION: Inability to self-administer medication needs to be constantly addressed. Implement a mechanism to ensure safe administration of the medications.  RECOMMENDATIONS FOR ALL PATIENTS WITH MEMORY  PROBLEMS: 1. Continue to exercise (Recommend 30 minutes of walking everyday, or 3 hours every week) 2. Increase social interactions - continue going to [place] and enjoy social gatherings with friends and family 3. Eat healthy, avoid fried foods and eat more fruits and vegetables 4. Maintain adequate blood pressure, blood sugar, and blood cholesterol level. Reducing the risk of stroke and cardiovascular disease also helps promoting better memory. 5. Avoid stressful situations. Live a simple life and avoid aggravations. Organize your time and prepare for the next day in anticipation. 6. Sleep well, avoid any interruptions of sleep and avoid any distractions in the bedroom that may interfere with adequate sleep quality 7. Avoid sugar, avoid sweets as there is a strong link between excessive sugar intake, diabetes, and cognitive impairment The Mediterranean diet has been shown to help patients reduce the risk of progressive memory disorders and reduces cardiovascular risk. This includes eating fish, eat fruits and green leafy vegetables, nuts like almonds and hazelnuts, walnuts, and also use olive oil. Avoid fast foods and fried foods as much as possible. Avoid sweets and sugar as sugar use has been linked to worsening of memory function.  There is always a concern of gradual progression of memory problems. If this is the case, then we may need to adjust level of care according to patient needs. Support, both to the patient and caregiver, should then be put into place.

## 2019-08-13 ENCOUNTER — Encounter: Payer: Self-pay | Admitting: Family Medicine

## 2019-08-13 ENCOUNTER — Ambulatory Visit (INDEPENDENT_AMBULATORY_CARE_PROVIDER_SITE_OTHER): Payer: Medicare Other | Admitting: Family Medicine

## 2019-08-13 ENCOUNTER — Other Ambulatory Visit: Payer: Self-pay

## 2019-08-13 VITALS — BP 138/82 | HR 72 | Temp 98.5°F | Ht 64.0 in | Wt 138.4 lb

## 2019-08-13 DIAGNOSIS — H5203 Hypermetropia, bilateral: Secondary | ICD-10-CM | POA: Diagnosis not present

## 2019-08-13 DIAGNOSIS — E785 Hyperlipidemia, unspecified: Secondary | ICD-10-CM

## 2019-08-13 DIAGNOSIS — F0390 Unspecified dementia without behavioral disturbance: Secondary | ICD-10-CM

## 2019-08-13 DIAGNOSIS — Z23 Encounter for immunization: Secondary | ICD-10-CM | POA: Diagnosis not present

## 2019-08-13 DIAGNOSIS — H353132 Nonexudative age-related macular degeneration, bilateral, intermediate dry stage: Secondary | ICD-10-CM | POA: Diagnosis not present

## 2019-08-13 DIAGNOSIS — R609 Edema, unspecified: Secondary | ICD-10-CM

## 2019-08-13 DIAGNOSIS — M810 Age-related osteoporosis without current pathological fracture: Secondary | ICD-10-CM

## 2019-08-13 DIAGNOSIS — Z0000 Encounter for general adult medical examination without abnormal findings: Secondary | ICD-10-CM | POA: Diagnosis not present

## 2019-08-13 DIAGNOSIS — F03A0 Unspecified dementia, mild, without behavioral disturbance, psychotic disturbance, mood disturbance, and anxiety: Secondary | ICD-10-CM

## 2019-08-13 DIAGNOSIS — H52203 Unspecified astigmatism, bilateral: Secondary | ICD-10-CM | POA: Diagnosis not present

## 2019-08-13 DIAGNOSIS — H2513 Age-related nuclear cataract, bilateral: Secondary | ICD-10-CM | POA: Diagnosis not present

## 2019-08-13 LAB — CBC WITH DIFFERENTIAL/PLATELET
Basophils Absolute: 0 10*3/uL (ref 0.0–0.1)
Basophils Relative: 0.7 % (ref 0.0–3.0)
Eosinophils Absolute: 0.3 10*3/uL (ref 0.0–0.7)
Eosinophils Relative: 6 % — ABNORMAL HIGH (ref 0.0–5.0)
HCT: 38.7 % (ref 36.0–46.0)
Hemoglobin: 13 g/dL (ref 12.0–15.0)
Lymphocytes Relative: 28.8 % (ref 12.0–46.0)
Lymphs Abs: 1.5 10*3/uL (ref 0.7–4.0)
MCHC: 33.6 g/dL (ref 30.0–36.0)
MCV: 93.8 fl (ref 78.0–100.0)
Monocytes Absolute: 0.5 10*3/uL (ref 0.1–1.0)
Monocytes Relative: 8.8 % (ref 3.0–12.0)
Neutro Abs: 2.9 10*3/uL (ref 1.4–7.7)
Neutrophils Relative %: 55.7 % (ref 43.0–77.0)
Platelets: 211 10*3/uL (ref 150.0–400.0)
RBC: 4.13 Mil/uL (ref 3.87–5.11)
RDW: 13.5 % (ref 11.5–15.5)
WBC: 5.3 10*3/uL (ref 4.0–10.5)

## 2019-08-13 LAB — LIPID PANEL
Cholesterol: 183 mg/dL (ref 0–200)
HDL: 78.9 mg/dL (ref >39.00)
LDL Cholesterol: 95 mg/dL (ref 0–99)
NonHDL: 103.98
Total CHOL/HDL Ratio: 2
Triglycerides: 46 mg/dL (ref 0.0–149.0)
VLDL: 9.2 mg/dL (ref 0.0–40.0)

## 2019-08-13 LAB — COMPREHENSIVE METABOLIC PANEL
ALT: 71 U/L — ABNORMAL HIGH (ref 0–35)
AST: 55 U/L — ABNORMAL HIGH (ref 0–37)
Albumin: 4.5 g/dL (ref 3.5–5.2)
Alkaline Phosphatase: 51 U/L (ref 39–117)
BUN: 15 mg/dL (ref 6–23)
CO2: 30 mEq/L (ref 19–32)
Calcium: 9.2 mg/dL (ref 8.4–10.5)
Chloride: 102 mEq/L (ref 96–112)
Creatinine, Ser: 0.67 mg/dL (ref 0.40–1.20)
GFR: 85.36 mL/min (ref >60.00)
Glucose, Bld: 81 mg/dL (ref 70–99)
Potassium: 4.1 mEq/L (ref 3.5–5.1)
Sodium: 139 mEq/L (ref 135–145)
Total Bilirubin: 1 mg/dL (ref 0.2–1.2)
Total Protein: 7.1 g/dL (ref 6.0–8.3)

## 2019-08-13 NOTE — Patient Instructions (Addendum)
Health Maintenance Due  Topic Date Due  . TETANUS/TDAP had [facility] will check [facility] today  09/14/2017  . INFLUENZA VACCINE  - had flu shots today 05/16/2019   Please stop by lab before you go If you do not have mychart- we will call you about results within 5 business days of [place] receiving them.  If you have mychart- we will send your results within 3 business days of [place] receiving them.  If abnormal or we want to clarify a result, we will call or mychart you to make sure you receive the message.  If you have questions or concerns or don't hear within 5-7 days, please send [place] a message or call us.   Influenza (Flu) Vaccine (Inactivated or Recombinant): What You Need to Know 1. Why get vaccinated? Influenza vaccine can prevent influenza (flu). Flu is a contagious disease that spreads around the [place] every year, usually between October and May. Anyone can get the flu, but it is more dangerous for some people. Infants and young children, people 69 years of age and older, pregnant women, and people with certain health conditions or a weakened immune system are at greatest risk of flu complications. Pneumonia, bronchitis, sinus infections and ear infections are examples of flu-related complications. If you have a medical condition, such as heart disease, cancer or diabetes, flu can make it worse. Flu can cause fever and chills, sore throat, muscle aches, fatigue, cough, headache, and runny or stuffy nose. Some people may have vomiting and diarrhea, though this is more common in children than adults. Each year thousands of people in the [place] die from flu, and many more are hospitalized. Flu vaccine prevents millions of illnesses and flu-related visits to the doctor each year. 2. Influenza vaccine CDC recommends everyone 13 months of age and older get vaccinated every flu season. Children 6 months through 80 years of age may need 2 doses during a single flu season. Everyone else needs only 1  dose each flu season. It takes about 2 weeks for protection to develop after vaccination. There are many flu viruses, and they are always changing. Each year a new flu vaccine is made to protect against three or four viruses that are likely to cause disease in the upcoming flu season. Even when the vaccine doesn't exactly match these viruses, it may still provide some protection. Influenza vaccine does not cause flu. Influenza vaccine may be given at the same time as other vaccines. 3. Talk with your health care provider Tell your vaccine provider if the person getting the vaccine:  Has had an allergic reaction after a previous dose of influenza vaccine, or has any severe, life-threatening allergies.  Has ever had Guillain-Barr Syndrome (also called GBS). In some cases, your health care provider may decide to postpone influenza vaccination to a future visit. People with minor illnesses, such as a cold, may be vaccinated. People who are moderately or severely ill should usually wait until they recover before getting influenza vaccine. Your health care provider can give you more information. 4. Risks of a vaccine reaction  Soreness, redness, and swelling where shot is given, fever, muscle aches, and headache can happen after influenza vaccine.  There may be a very small increased risk of Guillain-Barr Syndrome (GBS) after inactivated influenza vaccine (the flu shot). Young children who get the flu shot along with pneumococcal vaccine (PCV13), and/or DTaP vaccine at the same time might be slightly more likely to have a seizure caused by fever. Tell  your health care provider if a child who is getting flu vaccine has ever had a seizure. People sometimes faint after medical procedures, including vaccination. Tell your provider if you feel dizzy or have vision changes or ringing in the ears. As with any medicine, there is a very remote chance of a vaccine causing a severe allergic reaction, other  serious injury, or death. 5. What if there is a serious problem? An allergic reaction could occur after the vaccinated person leaves the clinic. If you see signs of a severe allergic reaction (hives, swelling of the face and throat, difficulty breathing, a fast heartbeat, dizziness, or weakness), call 9-1-1 and get the person to the nearest hospital. For other signs that concern you, call your health care provider. Adverse reactions should be reported to the Vaccine Adverse Event Reporting System (VAERS). Your health care provider will usually file this report, or you can do it yourself. Visit the VAERS website at www.vaers.SamedayNews.es or call (660)117-0638.VAERS is only for reporting reactions, and VAERS staff do not give medical advice. 6. The National Vaccine Injury Compensation Program The [employer] Vaccine Injury Compensation Program (VICP) is a federal program that was created to compensate people who may have been injured by certain vaccines. Visit the VICP website at GoldCloset.com.ee or call 9514532725 to learn about the program and about filing a claim. There is a time limit to file a claim for compensation. 7. How can I learn more?  Ask your healthcare provider.  Call your local or state health department.  Contact the Centers for Disease Control and Prevention (CDC): ? Call 3190095098 (1-800-CDC-INFO) or ? Visit CDC's https://gibson.com/ Vaccine Information Statement (Interim) Inactivated Influenza Vaccine (05/29/2018) This information is not intended to replace advice given to you by your health care provider. Make sure you discuss any questions you have with your health care provider. Document Released: 07/26/2006 Document Revised: 01/20/2019 Document Reviewed: 06/02/2018 Elsevier Patient Education  2020 [employer].

## 2019-08-13 NOTE — Progress Notes (Signed)
Phone: [phone]   Subjective:  Patient presents today for their annual physical. Chief complaint-noted.   See problem oriented charting- Review of Systems  Constitutional: Negative.   HENT: Negative.   Eyes: Negative.        Has eye appointment today   Respiratory: Negative.   Cardiovascular: Negative.   Gastrointestinal: Negative.   Genitourinary: Negative.   Musculoskeletal: Positive for back pain.       Seen by ortho   Skin: Negative.   Neurological: Negative.   Endo/Heme/Allergies: Negative.   Psychiatric/Behavioral: Positive for hallucinations.       Seen by neurology     The following were reviewed and entered/updated in epic: Past Medical History:  Diagnosis Date  . Arthritis   . Compression fracture of first lumbar vertebra (HCC)   . Iron deficiency anemia   . Macular degeneration   . Osteoporosis   . Wears glasses    Patient Active Problem List   Diagnosis Date Noted  . Mild dementia ([place]) 09/24/2018    Priority: High  . Hyperlipidemia 08/06/2017    Priority: Medium  . Osteoporosis 09/29/2007    Priority: Medium  . Edema 09/29/2007    Priority: Medium  . Lumbar stress fracture 03/13/2019    Priority: Low  . Mid back pain 08/06/2017    Priority: Low  . Macular degeneration 11/15/2014    Priority: Low  . Raynaud phenomenon 08/24/2011    Priority: Low  . Varicose veins of lower extremities with inflammation 08/21/2010    Priority: Low  . Chest pain 01/12/2008    Priority: Low  . Anemia 09/29/2007    Priority: Low  . LFT elevation 08/06/2017   Past Surgical History:  Procedure Laterality Date  . BUNIONECTOMY WITH WEIL OSTEOTOMY Left 05/27/2014   Procedure: Left First Metatarsal Scarf Osteotomy, Second Metatarsal Weil, Modified Mcbride Bunionectomy, Hammertoe Correction;  Surgeon: Wylene Simmer, MD;  Location: [place];  Service: Orthopedics;  Laterality: Left;  . CARDIAC CATHETERIZATION     2012  . COLONOSCOPY    . FOOT  SURGERY  2015,2000   bilateral  . Declo   wrist-lt  . KYPHOPLASTY N/A 07/16/2019   Procedure: LUMBAR 1 KYPHOPLASTY;  Surgeon: Melina Schools, MD;  Location: [facility];  Service: Orthopedics;  Laterality: N/A;  60 mins  . TUBAL LIGATION      Family History  Problem Relation Age of Onset  . Dementia Mother   . Stroke Father        early 68s, smoker  . Kidney disease Brother   . Cancer Sister        breast    Medications- reviewed and updated Current Outpatient Medications  Medication Sig Dispense Refill  . alendronate (FOSAMAX) 70 MG tablet Take 70 mg by mouth every Wednesday. Take with a full glass of water on an empty stomach.     . donepezil (ARICEPT) 10 MG tablet Take 1 tablet daily 30 tablet 11  . furosemide (LASIX) 40 MG tablet Take 1 tablet (40 mg total) by mouth as needed. (Patient taking differently: Take 40 mg by mouth daily. ) 30 tablet 4  . Magnesium 250 MG TABS Take 250 mg by mouth daily.     . potassium chloride (K-DUR,KLOR-CON) 10 MEQ tablet take 1 tablet by mouth once daily as needed-with lasix (Patient taking differently: Take 10 mEq by mouth daily. ) 30 tablet 4   No current facility-administered medications for this visit.  Allergies-reviewed and updated Allergies  Allergen Reactions  . Codeine Nausea And Vomiting    REACTION: Nausea, Vomitting    Social History   Social History Narrative   Pt is R handed   Lives in 2 story home with her husband, Rosanne Ashing   Married 1964   1 adult child   Some college education   Retired Customer service manager   Objective  Objective:  BP 138/82   Pulse 72   Temp 98.5 F (36.9 C) (Temporal)   Ht 5\' 4"  (1.626 m)   Wt 138 lb 6.4 oz (62.8 kg)   BMI 23.76 kg/m  Gen: NAD, resting comfortably HEENT: Mask not removed due to covid 19. TM normal. Bridge of nose normal. Eyelids normal.  Neck: no thyromegaly or cervical lymphadenopathy  CV: RRR no murmurs rubs or gallops Lungs: CTAB no crackles, wheeze,  rhonchi Abdomen: soft/nontender/nondistended/normal bowel sounds. No rebound or guarding.  Ext: no edema Skin: warm, dry Neuro: grossly normal, moves all extremities, PERRLA   Assessment and Plan    78 y.o. female presenting for annual physical.  Health Maintenance counseling: 1. Anticipatory guidance: Patient counseled regarding regular dental exams q6 months, eye exams has one today ,  avoiding smoking and second hand smoke , limiting alcohol to 1 beverage per day -does not drink at all  .   2. Risk factor reduction:  Advised patient of need for regular exercise and diet rich and fruits and vegetables to reduce risk of heart attack and stroke. Exercise- has not been due to back pain, waiting for orthopedics to release her to be able to do this. Diet-healthy food choices.  Wt Readings from Last 3 Encounters:  08/13/19 138 lb 6.4 oz (62.8 kg)  07/31/19 140 lb (63.5 kg)  07/09/19 139 lb 6.4 oz (63.2 kg)   3. Immunizations/screenings/ancillary studies- flu shot today. Had Tdap with health department- trying to get records. Also reports shingrix at health department- is waiting to get the 2nd.  Immunization History  Administered Date(s) Administered  . Fluad Quad(high Dose 65+) 08/13/2019  . Influenza Split 07/31/2012  . Influenza Whole 07/23/2008, 08/12/2009, 08/15/2010  . Influenza, High Dose Seasonal PF 08/03/2013, 08/06/2017, 08/08/2018  . Influenza,inj,quad, With Preservative 07/29/2018  . Influenza-Unspecified 07/11/2014, 07/21/2015, 08/16/2015, 07/07/2016  . Pneumococcal Conjugate-13 11/15/2014  . Pneumococcal Polysaccharide-23 08/21/2010  . Td 09/15/2007  . Tdap 10/02/2018  . Zoster 08/24/2011   4. Cervical cancer screening- n/a , past age based screening 5. Breast cancer screening-  breast exam does monthly self exam  and mammogram done yearly  6. Colon cancer screening - has had 2 in the past normal . Last notmrla 06/2009- will not need repeat as has aged out of age based  screening. No blood in stool or dark black stool.  7. Skin cancer screening- no dermatologist. advised regular sunscreen use. Denies worrisome, changing, or new skin lesions.  8. Birth control/STD check-monogamous/postmenopausal 9. Osteoporosis - Dexa scan on 06/10/2019 on foxamax weekly with no side effects.  Follows with her gynecologist Dr. 06/12/2019.  Also on calcium and vitamin D  - Never smoker  Status of chronic or acute concerns   Kyphoplasty - 1st of October- went all last week without pain medicine. Also on calcitonin.   Hyperlipidemia-update lipid panel today-unlikely to start statin given mild dementia issues unless significant LDL elevation such as over 130 on LDL.  Mild elevations in the past. Lab Results  Component Value Date   CHOL 189 08/08/2018   HDL 76.60  08/08/2018   LDLCALC 101 (H) 08/08/2018   TRIG 56.0 08/08/2018   CHOLHDL 2 08/08/2018    Edema- prior work up unrevealing- she prefers to take 40mg  lasix daily to prevent edema. Also on potassium. Getting through Dr. Aldona BarWein -initial BP slightly high improved on repeat improved. Home #s 110s over 70s- she admits to some anxiety in office  Age-related osteoporosis without current pathological fracture- see above  Memory loss/mild dementia-follows with Dr. Karel JarvisAquino she is on Aricept. Follows up in may - sometimes has hallucinations with this- like a woman trying to get her kids on school bus on end of driveway or a dog running outside of window but there is nothing there. Will continue to follow with Dr. Karel JarvisAquino- I told her I was not clear exactly what was causing this.  Encouraged regular follow-up with neurology-I do not think she has Parkinson's  Recommended follow up: 1 year physical [facility] Future [facility]ments  Date Time Provider Department Center  03/01/2020  3:30 PM Van ClinesAquino, Karen M, MD [facility] None   Lab/Order associations: fasting   ICD-10-CM   1. Preventative health care  Z00.00 CBC with  Differential/Platelet    Comprehensive metabolic panel    Lipid panel    VITAMIN D 25 Hydroxy (Vit-D Deficiency, Fractures)  2. Hyperlipidemia, unspecified hyperlipidemia type  E78.5 CBC with Differential/Platelet    Comprehensive metabolic panel    Lipid panel  3. Edema, unspecified type  R60.9   4. Age-related osteoporosis without current pathological fracture  M81.0 VITAMIN D 25 Hydroxy (Vit-D Deficiency, Fractures)  5. Mild dementia (HCC)  F03.90   6. Need for immunization against influenza  Z23 Flu Vaccine QUAD High Dose(Fluad)   Return precautions advised.  Tana ConchStephen Hunter, MD

## 2019-08-14 LAB — VITAMIN D 25 HYDROXY (VIT D DEFICIENCY, FRACTURES): VITD: 32.11 ng/mL (ref 30.00–100.00)

## 2019-08-17 ENCOUNTER — Encounter: Payer: Self-pay | Admitting: Family Medicine

## 2019-08-28 ENCOUNTER — Encounter: Payer: Self-pay | Admitting: Internal Medicine

## 2019-08-31 DIAGNOSIS — Z4889 Encounter for other specified surgical aftercare: Secondary | ICD-10-CM | POA: Diagnosis not present

## 2019-09-16 DIAGNOSIS — M546 Pain in thoracic spine: Secondary | ICD-10-CM | POA: Diagnosis not present

## 2019-09-21 ENCOUNTER — Telehealth: Payer: Self-pay | Admitting: Family Medicine

## 2019-09-21 NOTE — Telephone Encounter (Signed)
I called the patient to schedule AWV with [name], but there was no answer and no option to leave a message. If patient calls back, please schedule Medicare Wellness Visit at next available opening.  Julia Palmer (Dee-Dee) 

## 2019-09-24 DIAGNOSIS — M546 Pain in thoracic spine: Secondary | ICD-10-CM | POA: Diagnosis not present

## 2019-10-26 DIAGNOSIS — Z4889 Encounter for other specified surgical aftercare: Secondary | ICD-10-CM | POA: Diagnosis not present

## 2019-11-12 ENCOUNTER — Ambulatory Visit: Payer: Medicare Other

## 2019-11-21 ENCOUNTER — Ambulatory Visit: Payer: Medicare Other

## 2019-12-14 ENCOUNTER — Other Ambulatory Visit: Payer: Self-pay

## 2019-12-14 MED ORDER — DONEPEZIL HCL 10 MG PO TABS: ORAL_TABLET | ORAL | 11 refills | Status: DC

## 2020-03-01 ENCOUNTER — Ambulatory Visit: Payer: Medicare Other | Admitting: Neurology

## 2020-03-01 ENCOUNTER — Other Ambulatory Visit: Payer: Self-pay

## 2020-03-01 ENCOUNTER — Encounter: Payer: Self-pay | Admitting: Neurology

## 2020-03-01 VITALS — BP 173/92 | HR 70 | Resp 18 | Ht 64.0 in | Wt 139.0 lb

## 2020-03-01 DIAGNOSIS — F0390 Unspecified dementia without behavioral disturbance: Secondary | ICD-10-CM | POA: Diagnosis not present

## 2020-03-01 DIAGNOSIS — F03A0 Unspecified dementia, mild, without behavioral disturbance, psychotic disturbance, mood disturbance, and anxiety: Secondary | ICD-10-CM

## 2020-03-01 MED ORDER — ESCITALOPRAM OXALATE 10 MG PO TABS: 10.0000 mg | ORAL_TABLET | Freq: Every day | ORAL | 11 refills | Status: DC

## 2020-03-01 MED ORDER — DONEPEZIL HCL 10 MG PO TABS: ORAL_TABLET | ORAL | 3 refills | Status: DC

## 2020-03-01 MED ORDER — ATROPINE SULFATE 0.01 % OP SOLN: OPHTHALMIC | 5 refills | Status: DC
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
## 2020-03-01 NOTE — Patient Instructions (Addendum)
1. Continue Donepezil 10mg  daily  2. Start Lexapro 10mg  every night  3. For the drooling, I am giving you atropine drops. These drops can be diluted 72ml in of water and used as a mouth rinse up to three times a day. You can also try 2 drops under the tongue up to 3 times a day without diluting it if that works better for you, but sometimes it is difficult to manipulate the dropper and get it in properly.  4. Follow-up in 6 months, call for any changes

## 2020-03-02 ENCOUNTER — Telehealth: Payer: Self-pay | Admitting: Neurology

## 2020-03-02 ENCOUNTER — Other Ambulatory Visit: Payer: Self-pay

## 2020-03-02 MED ORDER — ATROPINE SULFATE 0.01 % OP SOLN: OPHTHALMIC | 5 refills | Status: DC

## 2020-03-02 NOTE — Telephone Encounter (Signed)
Pharmacy called with questions on the patient's Atropine prescription. The patient is there trying to get a refill

## 2020-03-03 NOTE — Telephone Encounter (Signed)
Yes, pls confirm that it is the 1%. Not sure why Epic order comes up like that. Thanks

## 2020-03-03 NOTE — Telephone Encounter (Signed)
Notified pharmacy.

## 2020-03-03 NOTE — Telephone Encounter (Signed)
Please clarify Atropine eye gtts, script states,".01", comes in 1% please advise.

## 2020-05-30 DIAGNOSIS — Z1231 Encounter for screening mammogram for malignant neoplasm of breast: Secondary | ICD-10-CM | POA: Diagnosis not present

## 2020-05-30 LAB — HM MAMMOGRAPHY

## 2020-07-19 DIAGNOSIS — H524 Presbyopia: Secondary | ICD-10-CM | POA: Diagnosis not present

## 2020-07-19 DIAGNOSIS — H52223 Regular astigmatism, bilateral: Secondary | ICD-10-CM | POA: Diagnosis not present

## 2020-07-19 DIAGNOSIS — H3530 Unspecified macular degeneration: Secondary | ICD-10-CM | POA: Diagnosis not present

## 2020-07-25 ENCOUNTER — Encounter: Payer: Self-pay | Admitting: Family Medicine

## 2020-07-26 ENCOUNTER — Ambulatory Visit: Payer: Medicare Other | Attending: Internal Medicine

## 2020-07-26 DIAGNOSIS — Z23 Encounter for immunization: Secondary | ICD-10-CM

## 2020-07-26 NOTE — Progress Notes (Signed)
   Covid-19 Vaccination Clinic  Name:  CERIA SUMINSKI    MRN: [id number] DOB: March 24, 1941  07/26/2020  Ms. Dicicco was observed post Covid-19 immunization for 15 minutes without incident. She was provided with Vaccine Information Sheet and instruction to access the V-Safe system.   Ms. Hakimi was instructed to call 911 with any severe reactions post vaccine: Marland Kitchen Difficulty breathing  . Swelling of face and throat  . A fast heartbeat  . A bad rash all over body  . Dizziness and weakness

## 2020-08-09 NOTE — Progress Notes (Signed)
Phone [phone]   Subjective:  Patient presents today for their annual physical. Chief complaint-noted.   See problem oriented charting- ROS- full  review of systems was completed and negative except for: fatigue, drooling (started after kyphoplasty), post nasal drip, runny nose (since kyphoplasty), eye itching, light sensitivity, diarrhea- not new, joinst pain, muscle aches, speech difficulty, confusion, hallucinations, nervous/anxious  The following were reviewed and entered/updated in epic: Past Medical History:  Diagnosis Date  . Arthritis   . Compression fracture of first lumbar vertebra (HCC)   . Iron deficiency anemia   . Macular degeneration   . Osteoporosis   . Wears glasses    Patient Active Problem List   Diagnosis Date Noted  . Mild dementia (HCC) 09/24/2018    Priority: High  . Hyperlipidemia 08/06/2017    Priority: Medium  . Osteoporosis 09/29/2007    Priority: Medium  . Edema 09/29/2007    Priority: Medium  . Lumbar stress fracture 03/13/2019    Priority: Low  . Mid back pain 08/06/2017    Priority: Low  . Macular degeneration 11/15/2014    Priority: Low  . Raynaud phenomenon 08/24/2011    Priority: Low  . Varicose veins of lower extremities with inflammation 08/21/2010    Priority: Low  . Chest pain 01/12/2008    Priority: Low  . Anemia 09/29/2007    Priority: Low  . LFT elevation 08/06/2017   Past Surgical History:  Procedure Laterality Date  . BUNIONECTOMY WITH WEIL OSTEOTOMY Left 05/27/2014   Procedure: Left First Metatarsal Scarf Osteotomy, Second Metatarsal Weil, Modified Mcbride Bunionectomy, Hammertoe Correction;  Surgeon: Toni Arthurs, MD;  Location: [facility];  Service: Orthopedics;  Laterality: Left;  . CARDIAC CATHETERIZATION     2012  . COLONOSCOPY    . FOOT SURGERY  2015,2000   bilateral  . GANGLION CYST EXCISION  1963   wrist-lt  . KYPHOPLASTY N/A 07/16/2019   Procedure: LUMBAR 1 KYPHOPLASTY;  Surgeon: Venita Lick, MD;  Location: [facility] OR;  Service: Orthopedics;  Laterality: N/A;  60 mins  . TUBAL LIGATION      Family History  Problem Relation Age of Onset  . Dementia Mother   . Stroke Father        early 85s, smoker  . Kidney disease Brother   . Cancer Sister        breast    Medications- reviewed and updated Current Outpatient Medications  Medication Sig Dispense Refill  . alendronate (FOSAMAX) 70 MG tablet Take 70 mg by mouth every Wednesday. Take with a full glass of water on an empty stomach.     . donepezil (ARICEPT) 10 MG tablet Take 1 tablet daily 90 tablet 3  . escitalopram (LEXAPRO) 10 MG tablet Take 1 tablet (10 mg total) by mouth at bedtime. 30 tablet 11  . furosemide (LASIX) 40 MG tablet Take 1 tablet (40 mg total) by mouth as needed. (Patient taking differently: Take 40 mg by mouth daily. ) 30 tablet 4  . Magnesium 250 MG TABS Take 250 mg by mouth daily.     . potassium chloride (K-DUR,KLOR-CON) 10 MEQ tablet take 1 tablet by mouth once daily as needed-with lasix (Patient taking differently: Take 10 mEq by mouth daily. ) 30 tablet 4  . Emollient (COLLAGEN EX) Apply topically. (Patient not taking: Reported on 08/10/2020)    . fluticasone (FLONASE) 50 MCG/ACT nasal spray Place 2 sprays into both nostrils daily. 16 g 3   No current facility-administered medications for  this visit.    Allergies-reviewed and updated Allergies  Allergen Reactions  . Codeine Nausea And Vomiting    REACTION: Nausea, Vomitting    Social History   Social History Narrative   Pt is R handed   Lives in 2 story home with her husband, Rosanne Ashing   Married 1964   1 adult child   Some college education   Retired Customer service manager   Objective  Objective:  BP 118/76   Pulse 79   Temp 98.6 F (37 C) (Temporal)   Resp 18   Ht 5\' 4"  (1.626 m)   Wt 136 lb 6.4 oz (61.9 kg)   BMI 23.41 kg/m  Gen: NAD, resting comfortably HEENT: Mucous membranes are moist. Oropharynx normal. Nasal turinates slightly  swollen and slight rhinorhrea noted Neck: no thyromegaly CV: RRR no murmurs rubs or gallops Lungs: CTAB no crackles, wheeze, rhonchi Abdomen: soft/nontender/nondistended/normal bowel sounds. No rebound or guarding.  Ext: no edema Skin: warm, dry Neuro: slow shuffling gait, needs assist to get onto table   Assessment and Plan     79 y.o. female presenting for annual physical.  Health Maintenance counseling: 1. Anticipatory guidance: Patient counseled regarding regular dental exams q6 months- now yearly, eye exams - yearly,  avoiding smoking and second hand smoke, limiting alcohol to 1 beverage per day- does not drink at all.   2. Risk factor reduction:  Advised patient of need for regular exercise and diet rich and fruits and vegetables to reduce risk of heart attack and stroke. Exercise- exercise class Wednesday and friday. Diet-reasonably healthy.  Wt Readings from Last 3 Encounters:  08/10/20 136 lb 6.4 oz (61.9 kg)  03/01/20 139 lb (63 kg)  08/13/19 138 lb 6.4 oz (62.8 kg)  3. Immunizations/screenings/ancillary studies- high dose flu shot today. Opts in HCV.  Immunization History  Administered Date(s) Administered  . Fluad Quad(high Dose 65+) 08/13/2019  . Influenza Split 07/31/2012  . Influenza Whole 07/23/2008, 08/12/2009, 08/15/2010  . Influenza, High Dose Seasonal PF 08/03/2013, 08/06/2017, 08/08/2018  . Influenza,inj,quad, With Preservative 07/29/2018  . Influenza-Unspecified 07/11/2014, 07/21/2015, 08/16/2015, 07/07/2016  . PFIZER SARS-COV-2 Vaccination 11/16/2019, 12/07/2019, 07/26/2020  . Pneumococcal Conjugate-13 11/15/2014  . Pneumococcal Polysaccharide-23 08/21/2010  . Td 09/15/2007  . Tdap 10/02/2018  . Zoster 08/24/2011  . Zoster Recombinat (Shingrix) 08/27/2019  4. Cervical cancer screening- passed age based screening. Sees gyn but does not do paps 5. Breast cancer screening-  breast exam with Dr. 13/09/2019 and mammogram at ob/gyn yearly 6. Colon cancer screening -  05/2018 with no repeat due to age in health maintenance- last formal copy I have is 07/12/2009 7. Skin cancer screening- no dermatologist. advised regular sunscreen use. Denies worrisome, changing, or new skin lesions.  8. Birth control/STD check- monogamous and postmenopausal 9. Osteoporosis screening at 67- see discussion below -Never smoker  Status of chronic or acute concerns   #Mild dementia- following closely with Dr. 77. Likely lewy body dementia. This would explain hallucinations. She is doing well on lexapro 10mg  and aricept 10 mg  # excessive daytime sleepiness/snoring- offered referral to pulmonology but she wants to hold off for now  #hyperlipidemia S: Medication:none  Lab Results  Component Value Date   CHOL 183 08/13/2019   HDL 78.90 08/13/2019   LDLCALC 95 08/13/2019   TRIG 46.0 08/13/2019   CHOLHDL 2 08/13/2019   A/P: due to memory changes we have opted out of cholesterol medicine- she would like to still update levels  # osteoporosis- on calcium  and vitamin D and fosamax through Dr. Aldona Bar. 05/2019 last dexa. Continue to follow with Dr. Aldona Bar- we will check vitamin D today  # runny nose/watery itchy eyes- possible allergies. Atropine was too expensive that Dr. Karel Jarvis sen tin. We will trial flonase.   #edema-  potassium through Dr. Aldona Bar as he also has her on lasix daily- we will check levels today  Mild elevations in liver #s last vist- update with labs today Lab Results  Component Value Date   ALT 71 (H) 08/13/2019   AST 55 (H) 08/13/2019   ALKPHOS 51 08/13/2019   BILITOT 1.0 08/13/2019   # back pain better after kyphoplasty last October.  Recommended follow up: Return in about 1 year (around 08/10/2021) for physical or sooner if needed. Future Appointments  Date Time Provider Department Center  10/03/2020  3:30 PM Van Clines, MD [facility] None   Lab/Order associations: fasting   ICD-10-CM   1. Preventative health care  Z00.00 COMPLETE METABOLIC PANEL  WITH GFR    Lipid panel    CBC with Differential/Platelet    VITAMIN D 25 Hydroxy (Vit-D Deficiency, Fractures)  2. Hyperlipidemia, unspecified hyperlipidemia type  E78.5 COMPLETE METABOLIC PANEL WITH GFR    Lipid panel    CBC with Differential/Platelet  3. Mild dementia (HCC)  F03.90   4. Age-related osteoporosis without current pathological fracture  M81.0 VITAMIN D 25 Hydroxy (Vit-D Deficiency, Fractures)  5. Encounter for hepatitis C screening test for low risk patient  Z11.59 Hepatitis C antibody   Meds ordered this encounter  Medications  . fluticasone (FLONASE) 50 MCG/ACT nasal spray    Sig: Place 2 sprays into both nostrils daily.    Dispense:  16 g    Refill:  3   Return precautions advised.  Tana Conch, MD

## 2020-08-09 NOTE — Patient Instructions (Addendum)
Please stop by lab before you go If you have mychart- we will send your results within 3 business days of [place] receiving them.  If you do not have mychart- we will call you about results within 5 business days of [place] receiving them.  *please note we are currently using [facility] which has a longer processing time than [place] typically so labs may not come back as quickly as in the past *please also note that you will see labs on mychart as soon as they post. I will later go in and write notes on them- will say "notes from Dr. Durene Cal"  Health Maintenance Due  Topic Date Due  . INFLUENZA VACCINE In office flu shot today high dose 05/15/2020   Trial flonase - if doesn't help after 1 month may stop.  Great to see you!

## 2020-08-10 ENCOUNTER — Encounter: Payer: Self-pay | Admitting: Family Medicine

## 2020-08-10 ENCOUNTER — Ambulatory Visit (INDEPENDENT_AMBULATORY_CARE_PROVIDER_SITE_OTHER): Payer: Medicare Other | Admitting: Family Medicine

## 2020-08-10 ENCOUNTER — Other Ambulatory Visit: Payer: Self-pay

## 2020-08-10 ENCOUNTER — Other Ambulatory Visit: Payer: Self-pay | Admitting: Family Medicine

## 2020-08-10 VITALS — BP 118/76 | HR 79 | Temp 98.6°F | Resp 18 | Ht 64.0 in | Wt 136.4 lb

## 2020-08-10 DIAGNOSIS — F03A0 Unspecified dementia, mild, without behavioral disturbance, psychotic disturbance, mood disturbance, and anxiety: Secondary | ICD-10-CM

## 2020-08-10 DIAGNOSIS — E785 Hyperlipidemia, unspecified: Secondary | ICD-10-CM

## 2020-08-10 DIAGNOSIS — Z23 Encounter for immunization: Secondary | ICD-10-CM | POA: Diagnosis not present

## 2020-08-10 DIAGNOSIS — Z0000 Encounter for general adult medical examination without abnormal findings: Secondary | ICD-10-CM | POA: Diagnosis not present

## 2020-08-10 DIAGNOSIS — Z1159 Encounter for screening for other viral diseases: Secondary | ICD-10-CM

## 2020-08-10 DIAGNOSIS — F0390 Unspecified dementia without behavioral disturbance: Secondary | ICD-10-CM | POA: Diagnosis not present

## 2020-08-10 DIAGNOSIS — M810 Age-related osteoporosis without current pathological fracture: Secondary | ICD-10-CM | POA: Diagnosis not present

## 2020-08-10 MED ORDER — FLUTICASONE PROPIONATE 50 MCG/ACT NA SUSP: 2.0000 | Freq: Every day | NASAL | 3 refills | Status: DC

## 2020-08-11 LAB — CBC WITH DIFFERENTIAL/PLATELET
Absolute Monocytes: 493 cells/uL (ref 200–950)
Basophils Absolute: 58 cells/uL (ref 0–200)
Basophils Relative: 1.1 %
Eosinophils Absolute: 159 cells/uL (ref 15–500)
Eosinophils Relative: 3 %
HCT: 38 % (ref 35.0–45.0)
Hemoglobin: 12.6 g/dL (ref 11.7–15.5)
Lymphs Abs: 1489 cells/uL (ref 850–3900)
MCH: 30.9 pg (ref 27.0–33.0)
MCHC: 33.2 g/dL (ref 32.0–36.0)
MCV: 93.1 fL (ref 80.0–100.0)
MPV: 10 fL (ref 7.5–12.5)
Monocytes Relative: 9.3 %
Neutro Abs: 3101 cells/uL (ref 1500–7800)
Neutrophils Relative %: 58.5 %
Platelets: 240 10*3/uL (ref 140–400)
RBC: 4.08 10*6/uL (ref 3.80–5.10)
RDW: 12.8 % (ref 11.0–15.0)
Total Lymphocyte: 28.1 %
WBC: 5.3 10*3/uL (ref 3.8–10.8)

## 2020-08-11 LAB — HEPATITIS C ANTIBODY
Hepatitis C Ab: NONREACTIVE
SIGNAL TO CUT-OFF: 0.07 (ref <1.00)

## 2020-08-11 LAB — COMPLETE METABOLIC PANEL WITH GFR
AG Ratio: 1.5 (calc) (ref 1.0–2.5)
ALT: 18 U/L (ref 6–29)
AST: 25 U/L (ref 10–35)
Albumin: 4.5 g/dL (ref 3.6–5.1)
Alkaline phosphatase (APISO): 49 U/L (ref 37–153)
BUN: 23 mg/dL (ref 7–25)
CO2: 29 mmol/L (ref 20–32)
Calcium: 10 mg/dL (ref 8.6–10.4)
Chloride: 104 mmol/L (ref 98–110)
Creat: 0.71 mg/dL (ref 0.60–0.93)
GFR, Est African American: 95 mL/min/{1.73_m2} (ref >=60)
GFR, Est Non African American: 82 mL/min/{1.73_m2} (ref >=60)
Globulin: 3 g/dL (calc) (ref 1.9–3.7)
Glucose, Bld: 72 mg/dL (ref 65–99)
Potassium: 4.1 mmol/L (ref 3.5–5.3)
Sodium: 141 mmol/L (ref 135–146)
Total Bilirubin: 0.8 mg/dL (ref 0.2–1.2)
Total Protein: 7.5 g/dL (ref 6.1–8.1)

## 2020-08-11 LAB — LIPID PANEL
Cholesterol: 190 mg/dL (ref <200)
HDL: 80 mg/dL (ref >=50)
LDL Cholesterol (Calc): 96 mg/dL (calc)
Non-HDL Cholesterol (Calc): 110 mg/dL (calc) (ref <130)
Total CHOL/HDL Ratio: 2.4 (calc) (ref <5.0)
Triglycerides: 55 mg/dL (ref <150)

## 2020-08-11 LAB — VITAMIN D 25 HYDROXY (VIT D DEFICIENCY, FRACTURES): Vit D, 25-Hydroxy: 39 ng/mL (ref 30–100)

## 2020-09-06 ENCOUNTER — Encounter: Payer: Medicare Other | Admitting: Family Medicine

## 2020-10-03 ENCOUNTER — Ambulatory Visit: Payer: Medicare Other | Admitting: Neurology

## 2020-10-03 ENCOUNTER — Other Ambulatory Visit: Payer: Self-pay

## 2020-10-03 ENCOUNTER — Encounter: Payer: Self-pay | Admitting: Neurology

## 2020-10-03 VITALS — BP 137/85 | HR 103 | Ht 64.0 in | Wt 135.2 lb

## 2020-10-03 DIAGNOSIS — F0281 Dementia in other diseases classified elsewhere with behavioral disturbance: Secondary | ICD-10-CM

## 2020-10-03 DIAGNOSIS — G3183 Dementia with Lewy bodies: Secondary | ICD-10-CM

## 2020-10-03 MED ORDER — DONEPEZIL HCL 10 MG PO TABS: ORAL_TABLET | ORAL | 3 refills | Status: DC

## 2020-10-03 MED ORDER — ESCITALOPRAM OXALATE 10 MG PO TABS: 10.0000 mg | ORAL_TABLET | Freq: Every day | ORAL | 3 refills | Status: DC

## 2020-10-03 MED ORDER — MEMANTINE HCL 10 MG PO TABS: ORAL_TABLET | ORAL | 11 refills | Status: DC

## 2020-10-03 NOTE — Progress Notes (Signed)
NEUROLOGY FOLLOW UP OFFICE NOTE  NEELAM TIGGS [id number] 1941-08-08  HISTORY OF PRESENT ILLNESS: I had the pleasure of seeing Rama Sorci in follow-up in the neurology clinic on 10/03/2020.  The patient was last seen 7 months ago for dementia, likely Lewy body dementia. She is again accompanied by her husband and daughter who help supplement the history today. He provided a 3-page report of concerns prior to the visit which was reviewed prior to the visit. Some days she is totally out of gear but other she is almost her normal self. She has trouble locking doors and does not know what day it is. She gets very confused about family relationships, asking if her daughter is theirs and sometimes not sure who she is. She does not recognize visitors or their house, asking if they live there. When they run out of something, she wants to go right away to get it (impatient). She has a hard time making decisions. Handwriting has become smaller that he cannot make it out sometimes. Voice has become softer, she does not talk loud enough.  She is good about taking her medications. She sits at the computer a lot. She is able to bathe and dress independently. He leaves her notes when he leaves her at home. She sleeps long hours, sometimes 12-14 hours. She goes to sleep while sitting on the chair during the day.  She thinks there are 3 of her husband and constantly asks why he knows all of their private family things in the past. She thinks a lady moves things and comes to the house from time to time. She has mentioned seeing a woman with a little boy or a woman sitting on a flower pot. Hallucinations are non-threatening to her. She is constantly asking why she is having problems and gets emotional when he talks to her about it. She does not understand why she cannot drive. She does not think anyone knows she has problems but thinks people talk about her behind her back. She does not know who to trust. She wants to  avoid crowds. She thinks he goes away and comes back the next day.  She tries to skip her regular exercise class sometimes but he pushes her to go.  Her daughter notes she is a little more tired and thinks her memory is "fair." She denies any headaches, dizziness, dysarthria, vision changes, no falls. Family has noticed she moves slower now. She is on Donepezil 10mg  daily and Lexapro 10mg  daily without side effects.    History on Initial Assessment 12/16/2018: This is a pleasant 79 year old right-handed woman with a history of osteoporosis presenting for evaluation of worsening memory.  She states her memory "can leave something to be desired." She has noticed word-finding difficulties, she tries to answer but sometimes the words don't come until 30 minutes later. Her husband started noticing changes around a year ago with general confusion "with a little bit of everything." She could not get the checkbooks balanced so he took over finances. Sometimes she is not sure how to write a check, worse the past 6 months. She would not remember where she left her phone or pocketbook. She has had difficulties using the TV remote control and the auto seat adjustment in her car, pushing a button and saying it was not doing what she wants for him, but working for her husband. Her husband does majority of driving. She is independent with dressing and bathing but occasionally may not remember  what she did with the clothes she took off. She spends a lot of time on the computer but would get confused and frustrated. She prints out emails for him, sometimes making 2 to 3 copies because she forgets she already did it. She used to knit all the time but for a time forgot how to do it. Her husband has watched her knit and saw her take an hour to do it, she would leave holes and repeat the process, frustrating her. She manages her own medications and denies missing medications.   Her husband has noticed that over the past 5 years she  would holller out loudly in her sleep, almost like someone is chasing her. She would act out her dreams. He got her an over the counter sleep aid with melatonin and has noticed these quiet down. Over the past 4-5 months, she has had visual hallucinations seeing a lady with 3 children outside on the street, stating they are there every morning. Her husband and neighbor have not seen these people. She has occasionally seen something black go behind her husband when he moved his arm. She has never been able to smell for years. No tremors. She has had brief double vision, one time she was knitting and saw 3 needles. She has occasional diarrhea, no constipation. She has Reynaud's syndrome with tingling in her fingers when cold. She has back pain. She rarely has headaches, no dizziness, dysarthria/dysphagia, neck pain, focal numbness/tingling/weakness, no falls. No paranoia, she is a little more irritable and frustrated with herself. Her mother had dementia. No history of significant head injury or alcohol use.   PAST MEDICAL HISTORY: Past Medical History:  Diagnosis Date  . Arthritis   . Compression fracture of first lumbar vertebra (HCC)   . Iron deficiency anemia   . Macular degeneration   . Osteoporosis   . Wears glasses     MEDICATIONS: Current Outpatient Medications on File Prior to Visit  Medication Sig Dispense Refill  . alendronate (FOSAMAX) 70 MG tablet Take 70 mg by mouth every Wednesday. Take with a full glass of water on an empty stomach.    . donepezil (ARICEPT) 10 MG tablet Take 1 tablet daily 90 tablet 3  . escitalopram (LEXAPRO) 10 MG tablet Take 1 tablet (10 mg total) by mouth at bedtime. 30 tablet 11  . fluticasone (FLONASE) 50 MCG/ACT nasal spray Place 2 sprays into both nostrils daily. 16 g 3  . furosemide (LASIX) 40 MG tablet Take 1 tablet (40 mg total) by mouth as needed. (Patient taking differently: Take 40 mg by mouth daily.) 30 tablet 4  . Magnesium 250 MG TABS Take 250 mg by  mouth daily.     . potassium chloride (K-DUR,KLOR-CON) 10 MEQ tablet take 1 tablet by mouth once daily as needed-with lasix (Patient taking differently: Take 10 mEq by mouth daily.) 30 tablet 4   No current facility-administered medications on file prior to visit.    ALLERGIES: Allergies  Allergen Reactions  . Codeine Nausea And Vomiting    REACTION: Nausea, Vomitting    FAMILY HISTORY: Family History  Problem Relation Age of Onset  . Dementia Mother   . Stroke Father        early 36s, smoker  . Kidney disease Brother   . Cancer Sister        breast    SOCIAL HISTORY: Social History   Socioeconomic History  . Marital status: Married    Spouse name: Not on file  .  Number of children: 1  . Years of education: Not on file  . Highest education level: Not on file  Occupational History  . Occupation: Engineer, production: [employer]  Tobacco Use  . Smoking status: Never Smoker  . Smokeless tobacco: Never Used  Vaping Use  . Vaping Use: Never used  Substance and Sexual Activity  . Alcohol use: No  . Drug use: No  . Sexual activity: Yes    Partners: Male  Other Topics Concern  . Not on file  Social History Narrative   Pt is R handed   Lives in 2 story home with her husband, Rosanne Ashing   Married 1964   1 adult child   Some college education   Retired Customer service manager   Social Determinants of Corporate investment banker Strain: Not on Ship broker Insecurity: Not on file  Transportation Needs: Not on file  Physical Activity: Not on file  Stress: Not on file  Social Connections: Not on file  Intimate Partner Violence: Not on file     PHYSICAL EXAM: Vitals:   10/03/20 1516  BP: 137/85  Pulse: (!) 103   General: No acute distress, +hypomimia Head:  Normocephalic/atraumatic Skin/Extremities: No rash, no edema Neurological Exam: alert and oriented to person, place, and time. No aphasia or dysarthria. Fund of knowledge is appropriate.  Recent and remote memory  are intact.  Attention and concentration are normal.  MMSE 27/30 MMSE - Mini Mental State Exam 10/03/2020 09/23/2018  Orientation to time 5 5  Orientation to Place 5 5  Registration 3 3  Attention/ Calculation 5 2  Recall 2 2  Language- name 2 objects 2 2  Language- repeat 1 1  Language- follow 3 step command 2 3  Language- read & follow direction 1 1  Write a sentence 1 1  Copy design 0 1  Total score 27 26    Cranial nerves: Pupils equal, round. Extraocular movements intact with no nystagmus. Visual fields full.  No facial asymmetry.  Motor: minimal cogwheeling with distraction. Muscle strength 5/5 throughout with no pronator drift.   Finger to nose testing intact.  Gait slow and cautious with absent arm swing. No tremor today.   IMPRESSION: This is a pleasant 79 yo RH woman with a history of osteoporosis with worsening memory,visual hallucinations, and symptoms suggestive of REM behavioral disturbance, with parkinsonian signs on exam, likely Lewy Body dementia.  MRI brain in June 2020 reported mild frontal atrophy, mild chronic microvascular disease, no acute changes. MMSE today 27/30. She overall does well with cognitive screening tests however family reports continued cognitive decline with behavioral changes. We discussed adding on Memantine, discussed side effects and expectations. Start Memantine 10mg  qhs x 2 weeks, then increase to 10mg  BID. Continue Donepezil 10mg  daily and Lexapro 10mg  daily. Continue close supervision. We discussed the importance of control of vascular risk factors, physical exercise, and brain stimulation exercises for brain health. Follow-up in 6 months, they know to call for any changes.    Thank you for allowing me to participate in her care.  Please do not hesitate to call for any questions or concerns.   , M.D.   CC: Dr. 

## 2020-10-03 NOTE — Patient Instructions (Signed)
1. Start Memantine 10mg : take 1 tablet every night for 2 weeks, then increase to 1 tablet twice a day  2. Continue Donepezil 10mg  daily and Escitalopram 10mg  daily  3. Follow-up in 6 months, call for any changes   FALL PRECAUTIONS: Be cautious when walking. Scan the area for obstacles that may increase the risk of trips and falls. When getting up in the mornings, sit up at the edge of the bed for a few minutes before getting out of bed. Consider elevating the bed at the head end to avoid drop of blood pressure when getting up. Walk always in a well-lit room (use night lights in the walls). Avoid area rugs or power cords from appliances in the middle of the walkways. Use a walker or a cane if necessary and consider physical therapy for balance exercise. Get your eyesight checked regularly.  FINANCIAL OVERSIGHT: Supervision, especially oversight when making financial decisions or transactions is also recommended.  HOME SAFETY: Consider the safety of the kitchen when operating appliances like stoves, microwave oven, and blender. Consider having supervision and share cooking responsibilities until no longer able to participate in those. Accidents with firearms and other hazards in the house should be identified and addressed as well.  DRIVING: Regarding driving, in patients with progressive memory problems, driving will be impaired. We advise to have someone else do the driving if trouble finding directions or if minor accidents are reported. Independent driving assessment is available to determine safety of driving.  ABILITY TO BE LEFT ALONE: If patient is unable to contact 911 operator, consider using LifeLine, or when the need is there, arrange for someone to stay with patients. Smoking is a fire hazard, consider supervision or cessation. Risk of wandering should be assessed by caregiver and if detected at any point, supervision and safe proof recommendations should be instituted.  MEDICATION  SUPERVISION: Inability to self-administer medication needs to be constantly addressed. Implement a mechanism to ensure safe administration of the medications.  RECOMMENDATIONS FOR ALL PATIENTS WITH MEMORY PROBLEMS: 1. Continue to exercise (Recommend 30 minutes of walking everyday, or 3 hours every week) 2. Increase social interactions - continue going to [place] and enjoy social gatherings with friends and family 3. Eat healthy, avoid fried foods and eat more fruits and vegetables 4. Maintain adequate blood pressure, blood sugar, and blood cholesterol level. Reducing the risk of stroke and cardiovascular disease also helps promoting better memory. 5. Avoid stressful situations. Live a simple life and avoid aggravations. Organize your time and prepare for the next day in anticipation. 6. Sleep well, avoid any interruptions of sleep and avoid any distractions in the bedroom that may interfere with adequate sleep quality 7. Avoid sugar, avoid sweets as there is a strong link between excessive sugar intake, diabetes, and cognitive impairment The Mediterranean diet has been shown to help patients reduce the risk of progressive memory disorders and reduces cardiovascular risk. This includes eating fish, eat fruits and green leafy vegetables, nuts like almonds and hazelnuts, walnuts, and also use olive oil. Avoid fast foods and fried foods as much as possible. Avoid sweets and sugar as sugar use has been linked to worsening of memory function.  There is always a concern of gradual progression of memory problems. If this is the case, then we may need to adjust level of care according to patient needs. Support, both to the patient and caregiver, should then be put into place.

## 2021-01-03 ENCOUNTER — Telehealth: Payer: Self-pay | Admitting: Family Medicine

## 2021-01-03 NOTE — Chronic Care Management (AMB) (Signed)
  Chronic Care Management   Outreach Note  01/03/2021 Name: Julia Palmer MRN: [id number] DOB: June 16, 1941  Referred by: Shelva Majestic, MD Reason for referral : No chief complaint on file.   An unsuccessful telephone outreach was attempted today. The patient was referred to the pharmacist for assistance with care management and care coordination.   Follow Up Plan:   Carmell [place] Upstream Scheduler

## 2021-01-12 ENCOUNTER — Telehealth: Payer: Self-pay | Admitting: Family Medicine

## 2021-01-12 NOTE — Chronic Care Management (AMB) (Signed)
  Chronic Care Management   Outreach Note  01/12/2021 Name: FELCIA HUEBERT MRN: [id number] DOB: 1941/06/27  Referred by: Shelva Majestic, MD Reason for referral : No chief complaint on file.   A second unsuccessful telephone outreach was attempted today. The patient was referred to pharmacist for assistance with care management and care coordination.  Follow Up Plan:   Carmell Austria Upstream Scheduler

## 2021-01-24 ENCOUNTER — Telehealth: Payer: Self-pay | Admitting: Family Medicine

## 2021-01-24 NOTE — Telephone Encounter (Signed)
Left message with spouse for patient to call back and schedule Medicare Annual Wellness Visit (AWV) either virtually OR in office.   Last AWV 09/23/18; please schedule at anytime with [facility] Health Advisor at [facility].  This should be a 45 minute visit.

## 2021-02-25 ENCOUNTER — Other Ambulatory Visit: Payer: Self-pay | Admitting: Neurology

## 2021-02-27 ENCOUNTER — Other Ambulatory Visit: Payer: Self-pay

## 2021-03-20 NOTE — Progress Notes (Signed)
Assessment/Plan:    Lewy Body Dementia with Behavioral Disturbance  This is a 80 year old right-handed woman with a history of Lewy body dementia with behavioral disturbance, overall stable when compared to prior visit.  She is tolerating donepezil and memantine well without any side effects.  Her behavior is better controlled, and does not have any hallucinations, or paranoia.  She is sleeping well.  During her prior visit she has symptoms suggestive of REM behavioral disturbance, with parkinsonian signs on exam, likely Lewy body dementia.  MRI of the brain in June 2020 showed mild frontal atrophy, mild chronic microvascular disease, without acute changes.  MMSE on 10/03/20 was 27/30.     Marland Kitchen Discussed safety both in and out of the home.  . Discussed the importance of regular daily schedule to maintain brain function.  . Continue to monitor mood.  Continue Lexapro 10 mg daily . Stay active at least 30 minutes at least 3 times a week.  . Naps should be scheduled and should be no longer than 60 minutes and should not occur after 2 PM.  . Continue donepezil 10 mg daily Side effects were discussed  . Continue memantine 10mg ,1 tablet twice daily.  Side effects were discussed . Follow up in 6  months.   Case discussed with Dr. who agrees with the plan       Subjective:   SARANDA LEGRANDE is a pleasant 80 year old right-handed woman with a history of osteoporosis, anemia, hyperlipidemia, was seen today in follow up for memory loss.  This patient is accompanied in the office by her husband 70 who supplements the history.  Previous records as well as any outside records available were reviewed prior to todays visit.  Patient is currently on donepezil 10 mg daily and on memantine 10 mg twice daily, with good tolerance.  She is also on Lexapro 10 mg daily for mood.   Patient feels that memory is "better "and so is her mood, without depression or irritability.  Her husband says that she has  good days and bad ones, but she is not gaining or losing any memory.  She takes longer time to answer questions, but does so properly.  Sometimes, she has mild confusion, not knowing who he is, she says for example "this is Rosanne Ashing, the guy who pays the bills ". The patient and her husband sleep in separate bedrooms, but he is not aware of any sleepwalking or nightmares.  She denies having any vivid dreams as she did before.  She denies leaving objects in unusual places, hallucinations or paranoia.  She is independent on dressing and bathing. Husband is in charge of the medications, driving and finances. Her appetite is good, and denies any trouble swallowing.  She does not cook.  She ambulates without a walker or a cane, but uses arm chairs to be able to stand up with ease and for comfort, has an electric seat that takes her from the main floor to upstairs.  She does not like to walk much, but 2 times a week attends Silver sneakers at a local gym.  She denies any headaches, trauma, injuries to the head or falls.  She denies any double vision, dizziness, focal numbness or tingling, but she does have Raynaud's syndrome.  She denies any unilateral weakness, and continues to have mild tremors.  She denies urine incontinence or retention, constipation or diarrhea.    History on Initial Assessment 12/16/2018: This is a pleasant 80 year old right-handed woman with  a history of osteoporosis presenting for evaluation of worsening memory.  She states her memory "can leave something to be desired." She has noticed word-finding difficulties, she tries to answer but sometimes the words don't come until 30 minutes later. Her husband started noticing changes around a year ago with general confusion "with a little bit of everything." She could not get the checkbooks balanced so he took over finances. Sometimes she is not sure how to write a check, worse the past 6 months. She would not remember where she left her phone or  pocketbook. She has had difficulties using the TV remote control and the auto seat adjustment in her car, pushing a button and saying it was not doing what she wants for him, but working for her husband. Her husband does majority of driving. She is independent with dressing and bathing but occasionally may not remember what she did with the clothes she took off. She spends a lot of time on the computer but would get confused and frustrated. She prints out emails for him, sometimes making 2 to 3 copies because she forgets she already did it. She used to knit all the time but for a time forgot how to do it. Her husband has watched her knit and saw her take an hour to do it, she would leave holes and repeat the process, frustrating her. She manages her own medications and denies missing medications.   Her husband has noticed that over the past 5 years she would holller out loudly in her sleep, almost like someone is chasing her. She would act out her dreams. He got her an over the counter sleep aid with melatonin and has noticed these quiet down. Over the past 4-5 months, she has had visual hallucinations seeing a lady with 3 children outside on the street, stating they are there every morning. Her husband and neighbor have not seen these people. She has occasionally seen something black go behind her husband when he moved his arm. She has never been able to smell for years. No tremors. She has had brief double vision, one time she was knitting and saw 3 needles. She has occasional diarrhea, no constipation. She has Reynaud's syndrome with tingling in her fingers when cold. She has back pain. She rarely has headaches, no dizziness, dysarthria/dysphagia, neck pain, focal numbness/tingling/weakness, no falls. No paranoia, she is a little more irritable and frustrated with herself. Her mother had dementia. No history of significant head injury or alcohol use.   PREVIOUS MEDICATIONS: none  CURRENT MEDICATIONS:   Outpatient Encounter Medications as of 03/21/2021  Medication Sig  . alendronate (FOSAMAX) 70 MG tablet Take 70 mg by mouth every Wednesday. Take with a full glass of water on an empty stomach.  . Biotin 10 MG CAPS Take by mouth.  . cholecalciferol (VITAMIN D3) 25 MCG (1000 UNIT) tablet Take 1,000 Units by mouth daily.  Marland Kitchen donepezil (ARICEPT) 10 MG tablet Take 1 tablet daily  . escitalopram (LEXAPRO) 10 MG tablet TAKE 1 TABLET(10 MG) BY MOUTH AT BEDTIME  . fluticasone (FLONASE) 50 MCG/ACT nasal spray Place 2 sprays into both nostrils daily.  . furosemide (LASIX) 40 MG tablet Take 1 tablet (40 mg total) by mouth as needed. (Patient taking differently: Take 40 mg by mouth daily.)  . Magnesium 250 MG TABS Take 250 mg by mouth daily.   . Multiple Vitamins-Minerals (ICAPS AREDS 2 PO) Take by mouth.  . potassium chloride (K-DUR,KLOR-CON) 10 MEQ tablet take 1 tablet by  mouth once daily as needed-with lasix (Patient taking differently: Take 10 mEq by mouth daily.)  . [DISCONTINUED] memantine (NAMENDA) 10 MG tablet Take 1 tablet every night for 2 weeks, then take 1 tablet in morning and 1 tablet in evening  . memantine (NAMENDA) 10 MG tablet Take 1 tablet in morning and 1 tablet in evening  . [DISCONTINUED] potassium chloride (KLOR-CON) 10 MEQ tablet Take 10 mEq by mouth daily.   No facility-administered encounter medications on file as of 03/21/2021.     Objective:     PHYSICAL EXAMINATION:    VITALS:   Vitals:   03/21/21 1128  BP: 136/78  Pulse: 66  SpO2: 99%  Weight: 134 lb (60.8 kg)  Height: 5\' 4"  (1.626 m)    GEN:  The patient appears stated age and is in NAD. HEENT:  Normocephalic, atraumatic.   Neurological examination:  General: NAD, well-groomed, appears stated age.  Hypomimia noted Orientation: The patient is alert. Oriented to person, place and not to date Cranial nerves: There is good facial symmetry.The speech is fluent and clear, but voice is soft. No aphasia or  dysarthria. Fund of knowledge is appropriate. Recent and remote memory are mildly impaired. Attention and concentration are normal.  Able to name objects and repeat phrases.  Hearing is intact to conversational tone.    Sensation: Sensation is intact to light touch throughout Motor: Strength is at least antigravity x2, lower extremity is about 4 out of 5 bilaterally.  Montreal Cognitive Assessment  12/16/2018  Visuospatial/ Executive (0/5) 4  Naming (0/3) 2  Attention: Read list of digits (0/2) 2  Attention: Read list of letters (0/1) 1  Attention: Serial 7 subtraction starting at 100 (0/3) 0  Language: Repeat phrase (0/2) 2  Language : Fluency (0/1) 0  Abstraction (0/2) 0  Delayed Recall (0/5) 4  Orientation (0/6) 6  Total 21     MMSE - Mini Mental State Exam 10/03/2020 09/23/2018  Orientation to time 5 5  Orientation to Place 5 5  Registration 3 3  Attention/ Calculation 5 2  Recall 2 2  Language- name 2 objects 2 2  Language- repeat 1 1  Language- follow 3 step command 2 3  Language- read & follow direction 1 1  Write a sentence 1 1  Copy design 0 1  Total score 27 26      Movement examination: Tone: There is normal tone in the LE, mild rigidity in her UE. Mild cogwheeling R>L  Abnormal movements:  minimal tremor.  No myoclonus.  No asterixis.   Coordination:  There is no decremation with RAM's. Normal finger to nose  Gait and Station: The patient has  difficulty arising out of a deep-seated chair  Has to use the hands. She has absent arm swing, Gait is cautious and narrow, takes time to turn around.     Total time spent on today's visit was 50 minutes, including both face-to-face time and nonface-to-face time.  Time included that spent on review of records (prior notes available to me/labs/imaging if pertinent), discussing treatment and goals, answering patient's questions and coordinating care.  Cc:  14/07/2018, MD Shelva Majestic, PA-C

## 2021-03-21 ENCOUNTER — Other Ambulatory Visit: Payer: Self-pay

## 2021-03-21 ENCOUNTER — Ambulatory Visit: Payer: Medicare Other | Admitting: Neurology

## 2021-03-21 ENCOUNTER — Encounter: Payer: Self-pay | Admitting: Neurology

## 2021-03-21 VITALS — BP 136/78 | HR 66 | Ht 64.0 in | Wt 134.0 lb

## 2021-03-21 DIAGNOSIS — G3183 Dementia with Lewy bodies: Secondary | ICD-10-CM

## 2021-03-21 DIAGNOSIS — F02818 Dementia in other diseases classified elsewhere, unspecified severity, with other behavioral disturbance: Secondary | ICD-10-CM

## 2021-03-21 DIAGNOSIS — F0281 Dementia in other diseases classified elsewhere with behavioral disturbance: Secondary | ICD-10-CM | POA: Diagnosis not present

## 2021-03-21 MED ORDER — MEMANTINE HCL 10 MG PO TABS: ORAL_TABLET | ORAL | 11 refills | Status: DC

## 2021-03-21 NOTE — Patient Instructions (Signed)
1. Continue Memantine 10mg  1 tablet twice a day  2. Continue Donepezil 10mg  daily and Escitalopram 10mg  daily  3. Follow-up in 6 months, call for any changes   FALL PRECAUTIONS: Be cautious when walking. Scan the area for obstacles that may increase the risk of trips and falls. When getting up in the mornings, sit up at the edge of the bed for a few minutes before getting out of bed. Consider elevating the bed at the head end to avoid drop of blood pressure when getting up. Walk always in a well-lit room (use night lights in the walls). Avoid area rugs or power cords from appliances in the middle of the walkways. Use a walker or a cane if necessary and consider physical therapy for balance exercise. Get your eyesight checked regularly.    HOME SAFETY: Consider the safety of the kitchen when operating appliances like stoves, microwave oven, and blender. Consider having supervision and share cooking responsibilities until no longer able to participate in those. Accidents with firearms and other hazards in the house should be identified and addressed as well.    ABILITY TO BE LEFT ALONE: If patient is unable to contact 911 operator, consider using LifeLine, or when the need is there, arrange for someone to stay with patients. Smoking is a fire hazard, consider supervision or cessation. Risk of wandering should be assessed by caregiver and if detected at any point, supervision and safe proof recommendations should be instituted.  MEDICATION SUPERVISION: Inability to self-administer medication needs to be constantly addressed. Implement a mechanism to ensure safe administration of the medications.  RECOMMENDATIONS FOR ALL PATIENTS WITH MEMORY PROBLEMS: 1. Continue to exercise (Recommend 30 minutes of walking everyday, or 3 hours every week) 2. Increase social interactions - continue going to [place] and enjoy social gatherings with friends and family 3. Eat healthy, avoid fried foods and eat more  fruits and vegetables 4. Maintain adequate blood pressure, blood sugar, and blood cholesterol level. Reducing the risk of stroke and cardiovascular disease also helps promoting better memory. 5. Avoid stressful situations. Live a simple life and avoid aggravations. Organize your time and prepare for the next day in anticipation. 6. Sleep well, avoid any interruptions of sleep and avoid any distractions in the bedroom that may interfere with adequate sleep quality 7. Avoid sugar, avoid sweets as there is a strong link between excessive sugar intake, diabetes, and cognitive impairment The Mediterranean diet has been shown to help patients reduce the risk of progressive memory disorders and reduces cardiovascular risk. This includes eating fish, eat fruits and green leafy vegetables, nuts like almonds and hazelnuts, walnuts, and also use olive oil. Avoid fast foods and fried foods as much as possible. Avoid sweets and sugar as sugar use has been linked to worsening of memory function.  There is always a concern of gradual progression of memory problems. If this is the case, then we may need to adjust level of care according to patient needs. Support, both to the patient and caregiver, should then be put into place.

## 2021-03-21 NOTE — Progress Notes (Signed)
Patient was seen, evaluated, and treatment plan was discussed with the Advanced Practice Provider. Her husband is happy to report that she is not having hallucinations or paranoia. She is tolerating Memantine 10mg  BID, Donepezil 10mg  daily, and Lexapro 10mg  daily without side effects. She reports a lot of drooling, her husband states she keeps her head bent to the right side, so drool goes down her shirt on that side. They had tried atropine drops in the past with no success. We discussed Botox for sialorrhea, they would like to hold off for now.  Encouraged to increase social activities, exercise. I have also reviewed the orders written for this patient which were under my direction. I agree with the findings and the plan of care as documented by the Advanced Practice Provider.

## 2021-05-08 DIAGNOSIS — R309 Painful micturition, unspecified: Secondary | ICD-10-CM | POA: Diagnosis not present

## 2021-05-08 LAB — COMPREHENSIVE METABOLIC PANEL
Albumin: 4.4 (ref 3.5–5.0)
Calcium: 9.4 (ref 8.7–10.7)
Globulin: 3

## 2021-05-08 LAB — CBC AND DIFFERENTIAL
HCT: 40 (ref 36–46)
Hemoglobin: 13.3 (ref 12.0–16.0)
Neutrophils Absolute: 4.2
Platelets: 263 (ref 150–399)
WBC: 6.9

## 2021-05-08 LAB — BASIC METABOLIC PANEL
BUN: 23 — AB (ref 4–21)
CO2: 28 — AB (ref 13–22)
Chloride: 101 (ref 99–108)
Creatinine: 0.8 (ref 0.5–1.1)
Glucose: 83
Potassium: 4.4 (ref 3.4–5.3)
Sodium: 142 (ref 137–147)

## 2021-05-08 LAB — CBC: RBC: 4.47 (ref 3.87–5.11)

## 2021-05-08 LAB — HEPATIC FUNCTION PANEL
ALT: 18 (ref 7–35)
AST: 26 (ref 13–35)
Alkaline Phosphatase: 77 (ref 25–125)
Bilirubin, Total: 0.6

## 2021-05-09 ENCOUNTER — Encounter: Payer: Self-pay | Admitting: Family Medicine

## 2021-05-24 DIAGNOSIS — R300 Dysuria: Secondary | ICD-10-CM | POA: Diagnosis not present

## 2021-07-27 DIAGNOSIS — H52223 Regular astigmatism, bilateral: Secondary | ICD-10-CM | POA: Diagnosis not present

## 2021-07-27 DIAGNOSIS — H524 Presbyopia: Secondary | ICD-10-CM | POA: Diagnosis not present

## 2021-07-27 DIAGNOSIS — H25813 Combined forms of age-related cataract, bilateral: Secondary | ICD-10-CM | POA: Diagnosis not present

## 2021-08-08 DIAGNOSIS — Z1231 Encounter for screening mammogram for malignant neoplasm of breast: Secondary | ICD-10-CM | POA: Diagnosis not present

## 2021-08-11 ENCOUNTER — Encounter: Payer: Medicare Other | Admitting: Family Medicine

## 2021-08-14 DIAGNOSIS — H353132 Nonexudative age-related macular degeneration, bilateral, intermediate dry stage: Secondary | ICD-10-CM | POA: Diagnosis not present

## 2021-08-14 DIAGNOSIS — H2513 Age-related nuclear cataract, bilateral: Secondary | ICD-10-CM | POA: Diagnosis not present

## 2021-08-14 DIAGNOSIS — H43813 Vitreous degeneration, bilateral: Secondary | ICD-10-CM | POA: Diagnosis not present

## 2021-08-24 ENCOUNTER — Encounter: Payer: Self-pay | Admitting: Family Medicine

## 2021-08-24 ENCOUNTER — Ambulatory Visit (INDEPENDENT_AMBULATORY_CARE_PROVIDER_SITE_OTHER): Payer: Medicare Other | Admitting: Family Medicine

## 2021-08-24 ENCOUNTER — Other Ambulatory Visit: Payer: Self-pay

## 2021-08-24 VITALS — BP 102/70 | HR 64 | Temp 97.9°F | Ht 64.0 in | Wt 119.0 lb

## 2021-08-24 DIAGNOSIS — F02A0 Dementia in other diseases classified elsewhere, mild, without behavioral disturbance, psychotic disturbance, mood disturbance, and anxiety: Secondary | ICD-10-CM

## 2021-08-24 DIAGNOSIS — M810 Age-related osteoporosis without current pathological fracture: Secondary | ICD-10-CM | POA: Diagnosis not present

## 2021-08-24 DIAGNOSIS — Z0000 Encounter for general adult medical examination without abnormal findings: Secondary | ICD-10-CM | POA: Diagnosis not present

## 2021-08-24 DIAGNOSIS — R319 Hematuria, unspecified: Secondary | ICD-10-CM

## 2021-08-24 DIAGNOSIS — E785 Hyperlipidemia, unspecified: Secondary | ICD-10-CM

## 2021-08-24 DIAGNOSIS — G3183 Dementia with Lewy bodies: Secondary | ICD-10-CM

## 2021-08-24 DIAGNOSIS — R32 Unspecified urinary incontinence: Secondary | ICD-10-CM

## 2021-08-24 DIAGNOSIS — Z23 Encounter for immunization: Secondary | ICD-10-CM | POA: Diagnosis not present

## 2021-08-24 LAB — MICROALBUMIN / CREATININE URINE RATIO
Creatinine,U: 258 mg/dL
Microalb Creat Ratio: 27.2 mg/g (ref 0.0–30.0)
Microalb, Ur: 70.2 mg/dL — ABNORMAL HIGH (ref 0.0–1.9)

## 2021-08-24 LAB — LIPID PANEL
Cholesterol: 184 mg/dL (ref 0–200)
HDL: 57.6 mg/dL (ref >39.00)
LDL Cholesterol: 110 mg/dL — ABNORMAL HIGH (ref 0–99)
NonHDL: 126.01
Total CHOL/HDL Ratio: 3
Triglycerides: 79 mg/dL (ref 0.0–149.0)
VLDL: 15.8 mg/dL (ref 0.0–40.0)

## 2021-08-24 LAB — CBC WITH DIFFERENTIAL/PLATELET
Basophils Absolute: 0.1 10*3/uL (ref 0.0–0.1)
Basophils Relative: 0.9 % (ref 0.0–3.0)
Eosinophils Absolute: 0.2 10*3/uL (ref 0.0–0.7)
Eosinophils Relative: 2.4 % (ref 0.0–5.0)
HCT: 36.6 % (ref 36.0–46.0)
Hemoglobin: 12 g/dL (ref 12.0–15.0)
Lymphocytes Relative: 23.2 % (ref 12.0–46.0)
Lymphs Abs: 1.8 10*3/uL (ref 0.7–4.0)
MCHC: 32.8 g/dL (ref 30.0–36.0)
MCV: 91.5 fl (ref 78.0–100.0)
Monocytes Absolute: 0.7 10*3/uL (ref 0.1–1.0)
Monocytes Relative: 8.4 % (ref 3.0–12.0)
Neutro Abs: 5.1 10*3/uL (ref 1.4–7.7)
Neutrophils Relative %: 65.1 % (ref 43.0–77.0)
Platelets: 323 10*3/uL (ref 150.0–400.0)
RBC: 4 Mil/uL (ref 3.87–5.11)
RDW: 14.9 % (ref 11.5–15.5)
WBC: 7.9 10*3/uL (ref 4.0–10.5)

## 2021-08-24 LAB — COMPREHENSIVE METABOLIC PANEL
ALT: 16 U/L (ref 0–35)
AST: 26 U/L (ref 0–37)
Albumin: 3.9 g/dL (ref 3.5–5.2)
Alkaline Phosphatase: 56 U/L (ref 39–117)
BUN: 20 mg/dL (ref 6–23)
CO2: 32 mEq/L (ref 19–32)
Calcium: 9.5 mg/dL (ref 8.4–10.5)
Chloride: 104 mEq/L (ref 96–112)
Creatinine, Ser: 0.77 mg/dL (ref 0.40–1.20)
GFR: 73.59 mL/min (ref >60.00)
Glucose, Bld: 80 mg/dL (ref 70–99)
Potassium: 4.8 mEq/L (ref 3.5–5.1)
Sodium: 142 mEq/L (ref 135–145)
Total Bilirubin: 0.6 mg/dL (ref 0.2–1.2)
Total Protein: 7.2 g/dL (ref 6.0–8.3)

## 2021-08-24 LAB — POC URINALSYSI DIPSTICK (AUTOMATED)
Bilirubin, UA: NEGATIVE
Blood, UA: POSITIVE
Glucose, UA: NEGATIVE
Ketones, UA: POSITIVE
Nitrite, UA: NEGATIVE
Protein, UA: POSITIVE — AB
Spec Grav, UA: >=1.03 — AB (ref 1.010–1.025)
Urobilinogen, UA: 0.2 E.U./dL
pH, UA: 6 (ref 5.0–8.0)

## 2021-08-24 LAB — VITAMIN D 25 HYDROXY (VIT D DEFICIENCY, FRACTURES): VITD: 48.72 ng/mL (ref 30.00–100.00)

## 2021-08-24 LAB — URINALYSIS, MICROSCOPIC ONLY

## 2021-08-24 MED ORDER — FLUTICASONE PROPIONATE 50 MCG/ACT NA SUSP: 2.0000 | Freq: Every day | NASAL | 3 refills | Status: DC

## 2021-08-24 NOTE — Progress Notes (Signed)
Phone [phone]   Subjective:  Patient presents today for their annual physical. Chief complaint-noted.   See problem oriented charting- ROS- full  review of systems was completed and negative except for: chills, dental problems, drooling, post nasal drip, runny nose, eye discharge and itching with allergies, leg swelling, diarrhea, increased thirst and urination with bed wetting (starting a month ago) and urinary urgency, joint pain, back pain, muscle aches, neck pain, confusion, working with eye doctor on visual issues- seeing retinal specialists- Dr. Sunday Corn - has dry glaucoma, runny nose wants to retry flonase  The following were reviewed and entered/updated in epic: Past Medical History:  Diagnosis Date   Arthritis    Compression fracture of first lumbar vertebra (HCC)    Iron deficiency anemia    Macular degeneration    Osteoporosis    Wears glasses    Patient Active Problem List   Diagnosis Date Noted   Mild dementia 09/24/2018    Priority: High   Hyperlipidemia 08/06/2017    Priority: Medium    Osteoporosis 09/29/2007    Priority: Medium    Edema 09/29/2007    Priority: Medium    Lumbar stress fracture 03/13/2019    Priority: Low   Mid back pain 08/06/2017    Priority: Low   Macular degeneration 11/15/2014    Priority: Low   Raynaud phenomenon 08/24/2011    Priority: Low   Varicose veins of lower extremities with inflammation 08/21/2010    Priority: Low   Chest pain 01/12/2008    Priority: Low   Anemia 09/29/2007    Priority: Low   LFT elevation 08/06/2017   Past Surgical History:  Procedure Laterality Date   BUNIONECTOMY WITH WEIL OSTEOTOMY Left 05/27/2014   Procedure: Left First Metatarsal Scarf Osteotomy, Second Metatarsal Weil, Modified Mcbride Bunionectomy, Hammertoe Correction;  Surgeon: Toni Arthurs, MD;  Location: [facility];  Service: Orthopedics;  Laterality: Left;   CARDIAC CATHETERIZATION     2012   COLONOSCOPY     FOOT SURGERY   2015,2000   bilateral   GANGLION CYST EXCISION  1963   wrist-lt   KYPHOPLASTY N/A 07/16/2019   Procedure: LUMBAR 1 KYPHOPLASTY;  Surgeon: Venita Lick, MD;  Location: [facility];  Service: Orthopedics;  Laterality: N/A;  60 mins   TUBAL LIGATION      Family History  Problem Relation Age of Onset   Dementia Mother    Stroke Father        early 73s, smoker   Kidney disease Brother    Cancer Sister        breast    Medications- reviewed and updated Current Outpatient Medications  Medication Sig Dispense Refill   alendronate (FOSAMAX) 70 MG tablet Take 70 mg by mouth every Wednesday. Take with a full glass of water on an empty stomach.     Biotin 10 MG CAPS Take by mouth.     cholecalciferol (VITAMIN D3) 25 MCG (1000 UNIT) tablet Take 1,000 Units by mouth daily.     donepezil (ARICEPT) 10 MG tablet Take 1 tablet daily 90 tablet 3   escitalopram (LEXAPRO) 10 MG tablet TAKE 1 TABLET(10 MG) BY MOUTH AT BEDTIME 30 tablet 0   furosemide (LASIX) 40 MG tablet Take 1 tablet (40 mg total) by mouth as needed. (Patient taking differently: Take 40 mg by mouth daily.) 30 tablet 4   Magnesium 250 MG TABS Take 250 mg by mouth daily.      memantine (NAMENDA) 10 MG tablet Take 1  tablet in morning and 1 tablet in evening 60 tablet 11   Multiple Vitamins-Minerals (ICAPS AREDS 2 PO) Take by mouth.     potassium chloride (K-DUR,KLOR-CON) 10 MEQ tablet take 1 tablet by mouth once daily as needed-with lasix (Patient taking differently: Take 10 mEq by mouth daily.) 30 tablet 4   fluticasone (FLONASE) 50 MCG/ACT nasal spray Place 2 sprays into both nostrils daily. 16 g 3   No current facility-administered medications for this visit.    Allergies-reviewed and updated Allergies  Allergen Reactions   Codeine Nausea And Vomiting    REACTION: Nausea, Vomitting    Social History   Social History Narrative   Pt is R handed   Lives in 2 story home with her husband, Rosanne Ashing   Married 1964   1 adult child   Some  college education   Retired Customer service manager   Objective  Objective:  BP 102/70   Pulse 64   Temp 97.9 F (36.6 C)   Ht 5\' 4"  (1.626 m)   Wt 119 lb (54 kg)   SpO2 97%   BMI 20.43 kg/m  Gen: NAD, resting comfortably HEENT: Mucous membranes are moist. Oropharynx normal. Nasal turbinates lightly erythematous Neck: no thyromegaly CV: RRR no murmurs rubs or gallops Lungs: CTAB no crackles, wheeze, rhonchi Abdomen: soft/nontender/nondistended/normal bowel sounds. No rebound or guarding.  Ext: no edema Skin: warm, dry Neuro: grossly normal, moves all extremities, PERRLA   Assessment and Plan   80 y.o. female presenting for annual physical.  Health Maintenance counseling: 1. Anticipatory guidance: Patient counseled regarding regular dental exams -yearly, eye exams -yearly- more regularly,  avoiding smoking and second hand smoke , limiting alcohol to 1 beverage per day-doesn't drink at all , no illicit drugs .   2. Risk factor reduction:  Advised patient of need for regular exercise and diet rich and fruits and vegetables to reduce risk of heart attack and stroke. Exercise- exercise classes every Wednesday and Friday for the most part- participates most of the time. Diet-reasonably healthy diet.  Wt Readings from Last 3 Encounters:  08/24/21 119 lb (54 kg)  03/21/21 134 lb (60.8 kg)  10/03/20 135 lb 3.2 oz (61.3 kg)  3. Immunizations/screenings/ancillary studies- discuss 2nd Shingrix--already had and Flu shot-had this AM - otherwise up-to-date. Immunization History  Administered Date(s) Administered   Fluad Quad(high Dose 65+) 08/13/2019, 08/10/2020, 08/24/2021   Influenza Split 07/31/2012   Influenza Whole 07/23/2008, 08/12/2009, 08/15/2010   Influenza, High Dose Seasonal PF 08/03/2013, 08/06/2017, 08/08/2018   Influenza,inj,quad, With Preservative 07/29/2018   Influenza-Unspecified 07/11/2014, 07/21/2015, 08/16/2015, 07/07/2016   PFIZER(Purple Top)SARS-COV-2 Vaccination  11/16/2019, 12/07/2019, 07/26/2020   Pfizer Covid-19 Vaccine Bivalent Booster 38yrs & up 05/29/2021   Pneumococcal Conjugate-13 11/15/2014   Pneumococcal Polysaccharide-23 08/21/2010   Td 09/15/2007   Tdap 10/02/2018   Zoster Recombinat (Shingrix) 08/27/2019   Zoster, Live 08/24/2011   4. Cervical cancer screening- past age based screening - may stop seeing GYN 5. Breast cancer screening-  breast exam with Dr. 13/06/2011 previously and mammogram at OB/GYN yearly- may stop seeing GYN 6. Colon cancer screening - 05/2018 with no repeat due to age in health maintenance- last formal copy I have was from 07/12/2009 7. Skin cancer screening- no dermatologist. advised regular sunscreen use. Denies worrisome, changing, or new skin lesions.  8. Birth control/STD check- monogamous and postmenopausal 9. Osteoporosis screening at 60- last DEXA on 06/10/2019 osteoporosis 10. Smoking associated screening - never smoker  Status of chronic or acute concerns   #  Lewy Body Dementia - sees Dr. Karel Jarvis S: medication: compliant with Memantine 10 mg BID, Aricept 10 mg daily, and Lexapro 10 mg daily without side effects - follows closely with Dr. Karel Jarvis - was seen in neurology 03/21/2021 and husband reported patient was no longer having hallucinations or paranoia- resolved again -did report some drooling - patient keeps her head bent to right side - Dr. Karel Jarvis discussed Botox for sialorrhea but they wanted to hold off  Today patient reports overall mildly worsening A/P: mild worsening with time- continue follow up with Dr. Karel Jarvis    #hyperlipidemia S: Medication: none at present - we opted to stay off cholesterol medications due to memory changes Lab Results  Component Value Date   CHOL 190 08/10/2020   HDL 80 08/10/2020   LDLCALC 96 08/10/2020   TRIG 55 08/10/2020   CHOLHDL 2.4 08/10/2020   A/P: update lipid panel- still want to remain off meds if possible  # excessive daytime sleepiness/snoring- offered  referral to pulmonology in last visit but she wanted to hold off- again today opts out -for drooling has declined botox  # osteoporosis- on calcium and vitamin D and fosamax through Dr. Aldona Bar. 05/2019 last DEXA. Continues to follow with Dr. Aldona Bar - we will check vitamin D today Last vitamin D Lab Results  Component Value Date   VD25OH 39 08/10/2020   #incontinence- new in last month- check UA and culture- wearing depends  # runny nose/watery itchy eyes- possible allergies. Patient  had used flonase tried in past-  Atropine was too expensive that Dr. Karel Jarvis prescribed in the past. Retrial flonase but run by eye doctor with glaucoma history  #Mild elevations in liver #s last vist- update with labs today (most recent levels normal)  Lab Results  Component Value Date   ALT 18 05/08/2021   AST 26 05/08/2021   ALKPHOS 77 05/08/2021   BILITOT 0.8 08/10/2020   # back pain better after kyphoplasty October 2020  #edema- continues lasix- takes most days and takes potassium  Recommended follow up: Return in about 1 year (around 08/24/2022) for a aphysical or sooner if needed. Future Appointments  Date Time Provider Department Center  08/24/2021 10:40 AM Shelva Majestic, MD [facility]  11/01/2021 10:00 AM Van Clines, MD [facility] None   Lab/Order associations: fasting   ICD-10-CM   1. Preventative health care  Z00.00     2. Mild Lewy body dementia, unspecified whether behavioral, psychotic, or mood disturbance or anxiety (HCC)  G31.83    F02.A0     3. Hyperlipidemia, unspecified hyperlipidemia type  E78.5 CBC with Differential/Platelet    Comprehensive metabolic panel    Lipid panel    4. Need for immunization against influenza  Z23 Flu Vaccine QUAD High Dose(Fluad)    5. Urinary incontinence, unspecified type  R32 POCT Urinalysis Dipstick (Automated)    Urine Culture    6. Age-related osteoporosis without current pathological fracture  M81.0 VITAMIN D 25 Hydroxy (Vit-D  Deficiency, Fractures)     Meds ordered this encounter  Medications   fluticasone (FLONASE) 50 MCG/ACT nasal spray    Sig: Place 2 sprays into both nostrils daily.    Dispense:  16 g    Refill:  3    I,Harris Phan,acting as a scribe for Tana Conch, MD.,have documented all relevant documentation on the behalf of Tana Conch, MD,as directed by  Tana Conch, MD while in the presence of Tana Conch, MD.  I, Tana Conch, MD, have reviewed  all documentation for this visit. The documentation on 08/24/21 for the exam, diagnosis, procedures, and orders are all accurate and complete.   Return precautions advised.  Tana Conch, MD

## 2021-08-24 NOTE — Addendum Note (Signed)
Addended by: Lorn Junes on: 08/24/2021 03:52 PM   Modules accepted: Orders

## 2021-08-24 NOTE — Addendum Note (Signed)
Addended by: Lorn Junes on: 08/24/2021 10:53 AM   Modules accepted: Orders

## 2021-08-24 NOTE — Patient Instructions (Addendum)
Health Maintenance Due  Topic Date Due   Zoster Vaccines- Shingrix (2 of 2) - Team please call they regular pharmacy located on [address] and get date of 2nd Shingrix dose that was administered.  10/22/2019   Please stop by lab before you go If you have mychart- we will send your results within 3 business days of [place] receiving them.  If you do not have mychart- we will call you about results within 5 business days of [place] receiving them.  *please also note that you will see labs on mychart as soon as they post. I will later go in and write notes on them- will say "notes from Dr. Durene Cal"  Retry Flonase  nasal spray - before you start this medication please ask your ophthalmologist if it is okay to use due to your glaucoma.  Recommended follow up: Return in about 1 year (around 08/24/2022) for a aphysical or sooner if needed.

## 2021-08-24 NOTE — Addendum Note (Signed)
Addended by: Lorn Junes on: 08/24/2021 11:54 AM   Modules accepted: Orders

## 2021-08-24 NOTE — Addendum Note (Signed)
Addended by: Lorn Junes on: 08/24/2021 11:55 AM   Modules accepted: Orders

## 2021-08-25 LAB — URINE CULTURE
MICRO NUMBER:: 12620768
SPECIMEN QUALITY:: ADEQUATE

## 2021-09-06 ENCOUNTER — Other Ambulatory Visit: Payer: Self-pay | Admitting: *Deleted

## 2021-09-06 DIAGNOSIS — R319 Hematuria, unspecified: Secondary | ICD-10-CM

## 2021-09-14 ENCOUNTER — Telehealth: Payer: Self-pay

## 2021-09-14 ENCOUNTER — Telehealth: Payer: Self-pay | Admitting: Neurology

## 2021-09-14 ENCOUNTER — Encounter (HOSPITAL_COMMUNITY): Payer: Self-pay | Admitting: Emergency Medicine

## 2021-09-14 ENCOUNTER — Emergency Department (HOSPITAL_COMMUNITY)
Admission: EM | Admit: 2021-09-14 | Discharge: 2021-09-14 | Disposition: A | Discharge status: Home / Self Care | Payer: Medicare Other | Attending: Emergency Medicine | Admitting: Emergency Medicine

## 2021-09-14 ENCOUNTER — Emergency Department (HOSPITAL_COMMUNITY): Payer: Medicare Other

## 2021-09-14 DIAGNOSIS — N3090 Cystitis, unspecified without hematuria: Secondary | ICD-10-CM | POA: Insufficient documentation

## 2021-09-14 DIAGNOSIS — Z79899 Other long term (current) drug therapy: Secondary | ICD-10-CM | POA: Insufficient documentation

## 2021-09-14 DIAGNOSIS — I6381 Other cerebral infarction due to occlusion or stenosis of small artery: Secondary | ICD-10-CM | POA: Diagnosis not present

## 2021-09-14 DIAGNOSIS — R531 Weakness: Secondary | ICD-10-CM

## 2021-09-14 DIAGNOSIS — G3183 Dementia with Lewy bodies: Secondary | ICD-10-CM | POA: Diagnosis not present

## 2021-09-14 DIAGNOSIS — M6281 Muscle weakness (generalized): Secondary | ICD-10-CM | POA: Diagnosis not present

## 2021-09-14 LAB — CBC
HCT: 41.6 % (ref 36.0–46.0)
Hemoglobin: 12.9 g/dL (ref 12.0–15.0)
MCH: 29.6 pg (ref 26.0–34.0)
MCHC: 31 g/dL (ref 30.0–36.0)
MCV: 95.4 fL (ref 80.0–100.0)
Platelets: 322 10*3/uL (ref 150–400)
RBC: 4.36 MIL/uL (ref 3.87–5.11)
RDW: 13.6 % (ref 11.5–15.5)
WBC: 7.8 10*3/uL (ref 4.0–10.5)
nRBC: 0 % (ref 0.0–0.2)

## 2021-09-14 LAB — URINALYSIS, ROUTINE W REFLEX MICROSCOPIC
Bilirubin Urine: NEGATIVE
Glucose, UA: NEGATIVE mg/dL
Ketones, ur: NEGATIVE mg/dL
Nitrite: NEGATIVE
Protein, ur: NEGATIVE mg/dL
Specific Gravity, Urine: 1.015 (ref 1.005–1.030)
pH: 5.5 (ref 5.0–8.0)

## 2021-09-14 LAB — BASIC METABOLIC PANEL
Anion gap: 8 (ref 5–15)
BUN: 19 mg/dL (ref 8–23)
CO2: 27 mmol/L (ref 22–32)
Calcium: 9.2 mg/dL (ref 8.9–10.3)
Chloride: 103 mmol/L (ref 98–111)
Creatinine, Ser: 0.81 mg/dL (ref 0.44–1.00)
GFR, Estimated: >60 mL/min (ref >60)
Glucose, Bld: 145 mg/dL — ABNORMAL HIGH (ref 70–99)
Potassium: 3.6 mmol/L (ref 3.5–5.1)
Sodium: 138 mmol/L (ref 135–145)

## 2021-09-14 LAB — URINALYSIS, MICROSCOPIC (REFLEX)

## 2021-09-14 MED ORDER — SODIUM CHLORIDE 0.9 % IV BOLUS: 1000.0000 mL | Freq: Once | INTRAVENOUS | Status: DC

## 2021-09-14 MED ORDER — NITROFURANTOIN MONOHYD MACRO 100 MG PO CAPS: 100.0000 mg | ORAL_CAPSULE | Freq: Two times a day (BID) | ORAL | 0 refills | Status: AC

## 2021-09-14 MED ORDER — NITROFURANTOIN MONOHYD MACRO 100 MG PO CAPS
100.0000 mg | ORAL_CAPSULE | Freq: Once | ORAL | Status: AC
  Administered 2021-09-14: 100 mg via ORAL
  Filled 2021-09-14: qty 1

## 2021-09-14 NOTE — Telephone Encounter (Signed)
Pt husband stated that she needs help with taken a shower, getting dressed getting out of the bed, and going up steps. He said that it is now becoming a lot for him and they need in home help

## 2021-09-14 NOTE — ED Notes (Signed)
Pt ambulated with primary RN to bathroom. Patient required contact-guard assistance.

## 2021-09-14 NOTE — Telephone Encounter (Addendum)
error 

## 2021-09-14 NOTE — Telephone Encounter (Signed)
Husband needs a call back to discuss getting his wife some home health care.

## 2021-09-14 NOTE — Telephone Encounter (Signed)
Pt husband cell number if cant be reached on home phone is [phone]

## 2021-09-14 NOTE — Telephone Encounter (Signed)
Please advise 

## 2021-09-14 NOTE — ED Provider Notes (Signed)
Emergency Medicine Provider Triage Evaluation Note  Julia Palmer , a 80 y.o. female  was evaluated in triage.  Pt complains of generalized weakness x 1 week ago.  According to husband at the bedside, patient has been spending most of the day in bed over the last 3 days.  She did have some hematuria noted at a doctor's visit 2-1/2 weeks ago.  She has had decrease in p.o. intake, has not left her bed.  She does have a history of dementia but husband feels that she has some worsening on her mental status.  Review of Systems  Positive: Level 5 caveat Negative:  Physical Exam  BP 95/61 (BP Location: Left Arm)   Pulse 74   Temp 98.8 F (37.1 C)   Resp 16   SpO2 93%  Gen:   Awake, no distress   Resp:  Normal effort  MSK:   Moves extremities without difficulty  Other:  Ellis commands, able to answer in short intensivist.  Lungs are clear.  No pain palpation of the abdomen.  Medical Decision Making  Medically screening exam initiated at 5:51 PM.  Appropriate orders placed.  Julia Palmer was informed that the remainder of the evaluation will be completed by another provider, this initial triage assessment does not replace that evaluation, and the importance of remaining in the ED until their evaluation is complete.  Patient here with worsening mental status over the last 3 days.  Some concern for acute on chronic process worsening her dementia.   Claude Manges, PA-C 09/14/21 1757    Mancel Bale, MD 09/14/21 [phone]

## 2021-09-14 NOTE — Telephone Encounter (Signed)
Called patient and left voicemail.  Instructed if worsening symptoms certainly take to the emergency room and that is still an option regardless.  I certainly understand his concern about gradual decline but there would also be the concern for possible delirium-is there any availability to have her seen in our clinic tomorrow?  If she is acting normally tomorrow certainly do not have to push for visit

## 2021-09-14 NOTE — ED Provider Notes (Signed)
I saw and evaluated the patient, reviewed the resident's note and I agree with the findings and plan.  Patient here complaining of weakness for about a week and decreased oral intake.  Has had no recorded fevers.  Urinalysis positive for infection here.  Will place on antibiotics and discharged home   Lorre Nick, MD 09/14/21 2250

## 2021-09-14 NOTE — ED Triage Notes (Signed)
Patient here for evaluation of generalized weakness that started one week ago. History of dementia, husband reports patient has become more lethargic over the last two days. Patient has had a decreased appetite, sleeps for long periods of time, and is more difficult to awaken than normal.

## 2021-09-14 NOTE — Telephone Encounter (Signed)
Access Nurse has called in:  States patient has been asleep for over 24 hours.  States has not ate, drank or taken meds.    States spouse could not wake patient.  While access nurse was on the phone with spouse states patient's daughter came in and was able to wake patient.  States patient asked for an apple and took a drink of water.  Access Nurse has suggested for spouse to call 911 / ed evaluation.  States spouse does not want to take patient to ED.  Believes patient is in "next"stages of dementia.   Is requesting call back in regard to what Dr. Durene Cal suggest.    Once note is sent it will be added to phone note.

## 2021-09-14 NOTE — Telephone Encounter (Signed)
Received a call from [place] who states he would like a call from Dr.Hunter as Zoa has not been responsive in over 24 hours. Fayrene Fearing says she is still breathing but is not responding to him and he has tried to get her to wake up to eat, and take medicine but she has been asleep. Fayrene Fearing says he is very worried about her, did have patient speak with triage but Fayrene Fearing would like a call from Dr.Hunter when he gets a chance.

## 2021-09-14 NOTE — ED Provider Notes (Signed)
[facility]PART[facility] Provider Note   CSN: [id number] Arrival date & time: 09/14/21  1705     History Chief Complaint  Patient presents with   Weakness    Julia Palmer is a 80 y.o. female.  The history is provided by the patient, medical records and the spouse.  Weakness Severity:  Moderate Onset quality:  Gradual Duration:  2 days Timing:  Constant Progression:  Worsening Context comment:  Recently diagnosed with Lewy body dementia Relieved by:  Nothing Worsened by:  Nothing Ineffective treatments:  None tried Associated symptoms: no abdominal pain, no arthralgias, no chest pain, no cough, no dysuria, no fever, no seizures, no shortness of breath and no vomiting       Past Medical History:  Diagnosis Date   Arthritis    Compression fracture of first lumbar vertebra (HCC)    Iron deficiency anemia    Macular degeneration    Osteoporosis    Wears glasses     Patient Active Problem List   Diagnosis Date Noted   Lumbar stress fracture 03/13/2019   Mild dementia 09/24/2018   Hyperlipidemia 08/06/2017   Mid back pain 08/06/2017   LFT elevation 08/06/2017   Macular degeneration 11/15/2014   Raynaud phenomenon 08/24/2011   Varicose veins of lower extremities with inflammation 08/21/2010   Chest pain 01/12/2008   Anemia 09/29/2007   Osteoporosis 09/29/2007   Edema 09/29/2007    Past Surgical History:  Procedure Laterality Date   BUNIONECTOMY WITH WEIL OSTEOTOMY Left 05/27/2014   Procedure: Left First Metatarsal Scarf Osteotomy, Second Metatarsal Weil, Modified Mcbride Bunionectomy, Hammertoe Correction;  Surgeon: Wylene Simmer, MD;  Location: [facility];  Service: Orthopedics;  Laterality: Left;   CARDIAC CATHETERIZATION     2012   COLONOSCOPY     FOOT SURGERY  2015,2000   bilateral   GANGLION CYST EXCISION  1963   wrist-lt   KYPHOPLASTY N/A 07/16/2019   Procedure: LUMBAR 1 KYPHOPLASTY;  Surgeon: Melina Schools,  MD;  Location: [facility];  Service: Orthopedics;  Laterality: N/A;  60 mins   TUBAL LIGATION       OB History   No obstetric history on file.     Family History  Problem Relation Age of Onset   Dementia Mother    Stroke Father        early 14s, smoker   Kidney disease Brother    Cancer Sister        breast    Social History   Tobacco Use   Smoking status: Never   Smokeless tobacco: Never  Vaping Use   Vaping Use: Never used  Substance Use Topics   Alcohol use: No   Drug use: No    Home Medications Prior to Admission medications   Medication Sig Start Date End Date Taking? Authorizing Provider  nitrofurantoin, macrocrystal-monohydrate, (MACROBID) 100 MG capsule Take 1 capsule (100 mg total) by mouth 2 (two) times daily for 5 days. 09/14/21 09/19/21 Yes Earline Stiner, Burnadette Peter, MD  alendronate (FOSAMAX) 70 MG tablet Take 70 mg by mouth every Wednesday. Take with a full glass of water on an empty stomach.    [provider]  Biotin 10 MG CAPS Take by mouth.    [provider]  cholecalciferol (VITAMIN D3) 25 MCG (1000 UNIT) tablet Take 1,000 Units by mouth daily.    [provider]  donepezil (ARICEPT) 10 MG tablet Take 1 tablet daily 10/03/20   Cameron Sprang, MD  escitalopram (  LEXAPRO) 10 MG tablet TAKE 1 TABLET(10 MG) BY MOUTH AT BEDTIME 02/27/21   Cameron Sprang, MD  fluticasone [facility]) 50 MCG/ACT nasal spray Place 2 sprays into both nostrils daily. 08/24/21   Marin Olp, MD  furosemide (LASIX) 40 MG tablet Take 1 tablet (40 mg total) by mouth as needed. Patient taking differently: Take 40 mg by mouth daily. 02/01/15   Marin Olp, MD  Magnesium 250 MG TABS Take 250 mg by mouth daily.     [provider]  memantine (NAMENDA) 10 MG tablet Take 1 tablet in morning and 1 tablet in evening 03/21/21   Cameron Sprang, MD  Multiple Vitamins-Minerals (ICAPS AREDS 2 PO) Take by mouth.    [provider]  potassium chloride  (K-DUR,KLOR-CON) 10 MEQ tablet take 1 tablet by mouth once daily as needed-with lasix Patient taking differently: Take 10 mEq by mouth daily. 02/01/15   Marin Olp, MD    Allergies    Codeine  Review of Systems   Review of Systems  Constitutional:  Negative for chills and fever.  HENT:  Negative for ear pain and sore throat.   Eyes:  Negative for pain and visual disturbance.  Respiratory:  Negative for cough and shortness of breath.   Cardiovascular:  Negative for chest pain and palpitations.  Gastrointestinal:  Negative for abdominal pain and vomiting.  Genitourinary:  Negative for dysuria and hematuria.  Musculoskeletal:  Negative for arthralgias and back pain.  Skin:  Negative for color change and rash.  Neurological:  Positive for weakness. Negative for seizures and syncope.  All other systems reviewed and are negative.  Physical Exam Updated Vital Signs BP 136/71 (BP Location: Left Arm)   Pulse 60   Temp 98 F (36.7 C) (Oral)   Resp 14   SpO2 100%   Physical Exam Vitals and nursing note reviewed.  Constitutional:      General: She is not in acute distress.    Appearance: Normal appearance. She is well-developed.  HENT:     Head: Normocephalic and atraumatic.     Right Ear: External ear normal.     Left Ear: External ear normal.     Nose: Nose normal. No congestion or rhinorrhea.     Mouth/Throat:     Mouth: Mucous membranes are moist.  Eyes:     Extraocular Movements: Extraocular movements intact.     Conjunctiva/sclera: Conjunctivae normal.     Pupils: Pupils are equal, round, and reactive to light.  Cardiovascular:     Rate and Rhythm: Normal rate and regular rhythm.     Pulses: Normal pulses.     Heart sounds: No murmur heard. Pulmonary:     Effort: Pulmonary effort is normal. No respiratory distress.     Breath sounds: Normal breath sounds. No wheezing, rhonchi or rales.  Abdominal:     General: Abdomen is flat. Bowel sounds are normal.      Palpations: Abdomen is soft.     Tenderness: There is no abdominal tenderness. There is no guarding or rebound.  Musculoskeletal:        General: No swelling, tenderness or deformity.     Cervical back: Normal range of motion and neck supple. No rigidity.  Skin:    General: Skin is warm and dry.     Capillary Refill: Capillary refill takes less than 2 seconds.  Neurological:     Mental Status: She is alert.     Comments: She is alert, but  oriented only to self.  She is following commands.  She is speaking with a quiet voice.  She is moving all her extremities equally.  She does have a central tremor.  Psychiatric:        Mood and Affect: Mood normal.    ED Results / Procedures / Treatments   Labs (all labs ordered are listed, but only abnormal results are displayed) Labs Reviewed  BASIC METABOLIC PANEL - Abnormal; Notable for the following components:      Result Value   Glucose, Bld 145 (*)    All other components within normal limits  URINALYSIS, ROUTINE W REFLEX MICROSCOPIC - Abnormal; Notable for the following components:   Hgb urine dipstick SMALL (*)    Leukocytes,Ua SMALL (*)    All other components within normal limits  URINALYSIS, MICROSCOPIC (REFLEX) - Abnormal; Notable for the following components:   Bacteria, UA RARE (*)    All other components within normal limits  URINE CULTURE  CBC  CBG MONITORING, ED    EKG None  Radiology CT Head Wo Contrast  Result Date: 09/14/2021 CLINICAL DATA:  Generalized weakness with a history of dementia. EXAM: CT HEAD WITHOUT CONTRAST TECHNIQUE: Contiguous axial images were obtained from the base of the skull through the vertex without intravenous contrast. COMPARISON:  None. FINDINGS: Brain: There is moderately developed cerebral atrophy, small vessel disease and atrophic ventriculomegaly with mild cerebellar atrophy. No focal asymmetry is seen concerning for an acute infarct, hemorrhage or mass. There are few tiny chronic bilateral  gangliocapsular lacunar infarcts. There is no midline shift. Vascular: There calcifications of the carotid siphons. No hyperdense central vessel is seen. Skull: Normal. Negative for fracture or focal lesion. Mild osteopenia. Sinuses/Orbits: There is mild membrane thickening in the maxillary and ethmoid sinuses. The frontal and sphenoid sinus and bilateral mastoid air cells are clear. Other: None. IMPRESSION: Chronic changes. No acute intracranial CT findings. Sinus membrane disease. Electronically Signed   By: Telford Nab M.D.   On: 09/14/2021 22:35    Procedures Procedures   Medications Ordered in ED Medications  nitrofurantoin (macrocrystal-monohydrate) (MACROBID) capsule 100 mg (100 mg Oral Given 09/14/21 2314)    ED Course  I have reviewed the triage vital signs and the nursing notes.  Pertinent labs & imaging results that were available during my care of the patient were reviewed by me and considered in my medical decision making (see chart for details).    MDM Rules/Calculators/A&P                          This is a 80 year old female with recently diagnosed Lewy body dementia.  She presents with progressive weakness and behavioral changes.  She is afebrile and hemodynamically stable.  Neuro exam as above.  Work-up performed to evaluate for reversible causes of mental status changes.  EKG showed sinus rhythm with normal axis, normal intervals, no acute ischemic changes.  CT head showed chronic ischemic changes, but no acute findings.  Normal white count, she is not tachycardic, nontoxic-appearing.  Low concern for sepsis.  UA did show positive leuk esterase with 21-50 white blood cells.  Findings concerning for acute urinary infection.  Electrolytes reassuring.  Creatinine at baseline.  Findings discussed with patient and family.  Recent mental status changes may be related to UTI.  Will treat with a course of p.o. antibiotics and close follow-up with PCP for reassessment.  Strict return  to ED precautions provided.  Final Clinical  Impression(s) / ED Diagnoses Final diagnoses:  Generalized weakness  Cystitis    Rx / DC Orders ED Discharge Orders          Ordered    nitrofurantoin, macrocrystal-monohydrate, (MACROBID) 100 MG capsule  2 times daily        09/14/21 2259             Lutricia Feil, MD 09/14/21 2337    Lorre Nick, MD 09/18/21 1035

## 2021-09-15 ENCOUNTER — Telehealth: Payer: Self-pay

## 2021-09-15 ENCOUNTER — Telehealth: Payer: Self-pay | Admitting: Neurology

## 2021-09-15 DIAGNOSIS — F02818 Dementia in other diseases classified elsewhere, unspecified severity, with other behavioral disturbance: Secondary | ICD-10-CM

## 2021-09-15 NOTE — Telephone Encounter (Signed)
Noted PCP notified.

## 2021-09-15 NOTE — Telephone Encounter (Signed)
Patient was seen in the ED. Notes in chart.

## 2021-09-15 NOTE — Telephone Encounter (Signed)
Patient was seen in ED.  Nurse: Gasper Sells, RN, [name] Date/Time [name] Time): 09/14/2021 3:23:27 PM Confirm and document reason for call. If symptomatic, describe symptoms. ---Caller states that his wife has been asleep for a day and half. She has dementia. The lady in the home just said she is awake and asking for her apple and peanut butter. Hasn't had water in a day and half. Resists movement. Her neurologist is out of town. She has been this way for over 2 years. Wonder about home health to come and help.  Does the patient have any new or worsening symptoms? ---Yes Will a triage be completed? ---Yes Related visit to physician within the last 2 weeks? ---No Does the PT have any chronic conditions? (i.e. diabetes, asthma, this includes High risk factors for pregnancy, etc.) ---Yes List chronic conditions. ---dementia, Is this a behavioral health or substance abuse call? ---Yes Are you having any thoughts or feelings of harming or killing yourself or someone else? ---No Are you currently experiencing any physical discomfort that you think may be related to the use of alcohol or other drugs? (use substance abuse or ---No  Do you worry that you may be hearing or seeing things that others do not? ---No Do you take medications for your condition(s)? ---Yes List medications here. ---memantine, aricept, AREDs, K+, furosemide, magnesium, Vit D  Dementia Symptoms Questions [1] Difficult to awaken or acting confused (e.g., disoriented, slurred speech) [2] present now [3] new-onset  09/14/2021 3:18:05 PM Send to Urgent Queue [name] 09/14/2021 3:37:10 PM Call EMS 911 Now Yes Gasper Sells, RN, [name]   called backline and report given to nurse. To send a message to Dr on what to do. Dr is out with covid.No appts for today or tomorrow. Advised husband d/n want to use 911 and is very in need of help. Its a lot on him. Wants help, HH or something

## 2021-09-18 LAB — URINE CULTURE

## 2021-09-19 NOTE — Telephone Encounter (Signed)
Pt daughter advised of her Dr.Tat note,  Looks like pt was in ED yesterday for UTI.  She can f/u with PCP about that, which was likely cause of weakness and not a primarily neuro issue     Daughter wanted to know if there is anything being done in regards to her dads question about home health.  Please refer to 09/14/21 note from [facility].  Please advised spouse Fayrene Fearing [phone].

## 2021-09-19 NOTE — Telephone Encounter (Signed)
Advised pt husband patient will need to be seen to have the order added for Home health per Dr.Jaffe okay to see [facility] Dec.

## 2021-09-19 NOTE — Telephone Encounter (Signed)
Pt is sch to see Huntley Dec on 09-21-21

## 2021-09-21 ENCOUNTER — Encounter: Payer: Self-pay | Admitting: Physician Assistant

## 2021-09-21 ENCOUNTER — Other Ambulatory Visit: Payer: Self-pay

## 2021-09-21 ENCOUNTER — Ambulatory Visit: Payer: Medicare Other | Admitting: Physician Assistant

## 2021-09-21 VITALS — Resp 18 | Ht 64.0 in | Wt 120.0 lb

## 2021-09-21 DIAGNOSIS — G3183 Dementia with Lewy bodies: Secondary | ICD-10-CM

## 2021-09-21 DIAGNOSIS — R251 Tremor, unspecified: Secondary | ICD-10-CM | POA: Diagnosis not present

## 2021-09-21 DIAGNOSIS — F02A0 Dementia in other diseases classified elsewhere, mild, without behavioral disturbance, psychotic disturbance, mood disturbance, and anxiety: Secondary | ICD-10-CM

## 2021-09-21 NOTE — Progress Notes (Signed)
Assessment/Plan:    Lewy Body Dementia with Behavioral Disturbance  Discussed safety both in and out of the home.  Recommend Home Health Nursing as she needs 24/7 supervision to help with bathing, changing, medications, and for overall safety Discussed the importance of regular daily schedule to maintain brain function.  Continue to monitor mood.  Continue Lexapro 10 mg daily Stay active at least 30 minutes at least 3 times a week.  Naps should be scheduled and should be no longer than 60 minutes and should not occur after 2 PM.  Continue donepezil 10 mg daily Side effects were discussed  Continue memantine 10mg ,1 tablet twice daily.  Side effects were discussed Follow up in 6  months.   Case discussed with Dr. who agrees with the plan   Subjective:   Julia Palmer is a pleasant 80 year old right-handed woman with a history of osteoporosis, anemia, hyperlipidemia, was seen today in follow up for memory loss.  She as a history of Lewy body dementia with behavioral disturbance, without worsening of memory. She is tolerating donepezil and memantine well without any side effects.  She was last seen at our office on 03/21/2021.  She continues to showed REM behavioral disturbance with parkinsonian signs due to Lewy body dementia.  Prior MRI of the brain in June 2020 showed mild frontal atrophy, mild chronic microvascular disease without acute changes.  And last MMSE at that time on 10/03/2020 was 27/30. This patient is accompanied in the office by her husband 10/05/2020 who supplements the history.  Previous records as well as any outside records available were reviewed prior to todays visit.   She is also on Lexapro 10 mg daily for mood.  Her husband reports that she has some bad and good days, but she does have difficulty with name-calling, sometimes cannot remember Rosanne Ashing.  She started to do the same with her daughter, which has created some sadness in the family.  At times, she may not be able to  answer the questions properly as before.  Although the patient and her husband do not sleep in the same bedroom, he is not aware of any sleepwalking or nightmares, may be showing some REM behavior, talking in her sleep, and she sleeps about 10 hours a day.  Denies paranoia.  She likes to watch TV, [employer] movies, spends time with some visitors, and talks to her daughter, but she cannot have long conversations.  She denies leaving objects in unusual places.  Her appetite is good, and her husband supplements it with 2 protein drinks a day.  Of note, she has been hospitalized recently for UTI and dehydration, she does not like to drink enough water.  She denies trouble swallowing.  Her husband is in charge of the medications, driving and finances. She denies having any vivid dreams as she did before.  She denies leaving objects in unusual places, hallucinations or paranoia.  She now needs more help to dress, shower and getting out of bed, going up steps, Husband is in charge of the medications, driving and finances. She ambulates without a walker or a cane, but uses arm chairs to be able to stand up with ease and for comfort, has an electric seat that takes her from the main floor to upstairs.   She denies any headaches, trauma, injuries to the head or falls.  She denies any double vision, dizziness, focal numbness or tingling, but she does have Raynaud's syndrome.  She denies any unilateral weakness, and continues  to have mild tremors.      History on Initial Assessment 12/16/2018: This is a pleasant 80 year old right-handed woman with a history of osteoporosis presenting for evaluation of worsening memory.  She states her memory "can leave something to be desired." She has noticed word-finding difficulties, she tries to answer but sometimes the words don't come until 30 minutes later. Her husband started noticing changes around a year ago with general confusion "with a little bit of everything." She could not get  the checkbooks balanced so he took over finances. Sometimes she is not sure how to write a check, worse the past 6 months. She would not remember where she left her phone or pocketbook. She has had difficulties using the TV remote control and the auto seat adjustment in her car, pushing a button and saying it was not doing what she wants for him, but working for her husband. Her husband does majority of driving. She is independent with dressing and bathing but occasionally may not remember what she did with the clothes she took off. She spends a lot of time on the computer but would get confused and frustrated. She prints out emails for him, sometimes making 2 to 3 copies because she forgets she already did it. She used to knit all the time but for a time forgot how to do it. Her husband has watched her knit and saw her take an hour to do it, she would leave holes and repeat the process, frustrating her. She manages her own medications and denies missing medications.    Her husband has noticed that over the past 5 years she would holller out loudly in her sleep, almost like someone is chasing her. She would act out her dreams. He got her an over the counter sleep aid with melatonin and has noticed these quiet down. Over the past 4-5 months, she has had visual hallucinations seeing a lady with 3 children outside on the street, stating they are there every morning. Her husband and neighbor have not seen these people. She has occasionally seen something black go behind her husband when he moved his arm. She has never been able to smell for years. No tremors. She has had brief double vision, one time she was knitting and saw 3 needles. She has occasional diarrhea, no constipation. She has Reynaud's syndrome with tingling in her fingers when cold. She has back pain. She rarely has headaches, no dizziness, dysarthria/dysphagia, neck pain, focal numbness/tingling/weakness, no falls. No paranoia, she is a little more  irritable and frustrated with herself. Her mother had dementia. No history of significant head injury or alcohol use.   PREVIOUS MEDICATIONS: none  CURRENT MEDICATIONS:  Outpatient Encounter Medications as of 09/21/2021  Medication Sig   alendronate (FOSAMAX) 70 MG tablet Take 70 mg by mouth every Wednesday. Take with a full glass of water on an empty stomach.   Biotin 10 MG CAPS Take by mouth.   cholecalciferol (VITAMIN D3) 25 MCG (1000 UNIT) tablet Take 1,000 Units by mouth daily.   donepezil (ARICEPT) 10 MG tablet Take 1 tablet daily   escitalopram (LEXAPRO) 10 MG tablet TAKE 1 TABLET(10 MG) BY MOUTH AT BEDTIME   fluticasone (FLONASE) 50 MCG/ACT nasal spray Place 2 sprays into both nostrils daily.   furosemide (LASIX) 40 MG tablet Take 1 tablet (40 mg total) by mouth as needed. (Patient taking differently: Take 40 mg by mouth daily.)   Magnesium 250 MG TABS Take 250 mg by mouth  daily.    memantine (NAMENDA) 10 MG tablet Take 1 tablet in morning and 1 tablet in evening   Multiple Vitamins-Minerals (ICAPS AREDS 2 PO) Take by mouth.   potassium chloride (K-DUR,KLOR-CON) 10 MEQ tablet take 1 tablet by mouth once daily as needed-with lasix (Patient taking differently: Take 10 mEq by mouth daily.)   potassium chloride (KLOR-CON) 10 MEQ tablet Take 10 mEq by mouth daily.   No facility-administered encounter medications on file as of 09/21/2021.     Objective:     PHYSICAL EXAMINATION:    VITALS:   Vitals:   09/21/21 1311  Resp: 18  Weight: 120 lb (54.4 kg)  Height: 5\' 4"  (1.626 m)     GEN:  The patient appears stated age and is in NAD. HEENT:  Normocephalic, atraumatic.   Neurological examination:  General: NAD, well-groomed, appears stated age.  Hypomimia noted Orientation: The patient is alert. Oriented to person, place and not to date Cranial nerves: There is good facial symmetry.The speech is fluent and clear, but voice is soft. No aphasia or dysarthria. Fund of knowledge  is appropriate. Recent and remote memory are mildly impaired. Attention and concentration are normal.  Able to name objects and repeat phrases.  Hearing is intact to conversational tone.    Sensation: Sensation is intact to light touch throughout Motor: Strength is at least antigravity x2, lower extremity is about 4 out of 5 bilaterally.  Montreal Cognitive Assessment  12/16/2018  Visuospatial/ Executive (0/5) 4  Naming (0/3) 2  Attention: Read list of digits (0/2) 2  Attention: Read list of letters (0/1) 1  Attention: Serial 7 subtraction starting at 100 (0/3) 0  Language: Repeat phrase (0/2) 2  Language : Fluency (0/1) 0  Abstraction (0/2) 0  Delayed Recall (0/5) 4  Orientation (0/6) 6  Total 21     MMSE - Mini Mental State Exam 10/03/2020 09/23/2018  Orientation to time 5 5  Orientation to Place 5 5  Registration 3 3  Attention/ Calculation 5 2  Recall 2 2  Language- name 2 objects 2 2  Language- repeat 1 1  Language- follow 3 step command 2 3  Language- read & follow direction 1 1  Write a sentence 1 1  Copy design 0 1  Total score 27 26      Movement examination: Tone: There is normal tone in the LE, mild rigidity in her UE. Mild cogwheeling R>L  Abnormal movements:  mild  tremor.  No myoclonus.  No asterixis.   Coordination:  There is some decremation with RAM's. Normal finger to nose  Gait and Station: The patient has  difficulty arising out of a deep-seated chair  Has to use the hands. She has absent arm swing, Gait is cautious and narrow, mild en-block turn    Total time spent on today's visit was 50 minutes, including both face-to-face time and nonface-to-face time.  Time included that spent on review of records (prior notes available to me/labs/imaging if pertinent), discussing treatment and goals, answering patient's questions and coordinating care.  Cc:  14/07/2018, MD Shelva Majestic, PA-C

## 2021-09-21 NOTE — Patient Instructions (Addendum)
1. Continue Memantine 10mg  1 tablet twice a day  2. Continue Donepezil 10mg  daily and Escitalopram 10mg  daily  3. Follow-up as scheduled   4. Home Health referral   5. PT for strength and balance   FALL PRECAUTIONS: Be cautious when walking. Scan the area for obstacles that may increase the risk of trips and falls. When getting up in the mornings, sit up at the edge of the bed for a few minutes before getting out of bed. Consider elevating the bed at the head end to avoid drop of blood pressure when getting up. Walk always in a well-lit room (use night lights in the walls). Avoid area rugs or power cords from appliances in the middle of the walkways. Use a walker or a cane if necessary and consider physical therapy for balance exercise. Get your eyesight checked regularly.    HOME SAFETY: Consider the safety of the kitchen when operating appliances like stoves, microwave oven, and blender. Consider having supervision and share cooking responsibilities until no longer able to participate in those. Accidents with firearms and other hazards in the house should be identified and addressed as well.    ABILITY TO BE LEFT ALONE: If patient is unable to contact 911 operator, consider using LifeLine, or when the need is there, arrange for someone to stay with patients. Smoking is a fire hazard, consider supervision or cessation. Risk of wandering should be assessed by caregiver and if detected at any point, supervision and safe proof recommendations should be instituted.  MEDICATION SUPERVISION: Inability to self-administer medication needs to be constantly addressed. Implement a mechanism to ensure safe administration of the medications.  RECOMMENDATIONS FOR ALL PATIENTS WITH MEMORY PROBLEMS: 1. Continue to exercise (Recommend 30 minutes of walking everyday, or 3 hours every week) 2. Increase social interactions - continue going to [place] and enjoy social gatherings with friends and family 3. Eat  healthy, avoid fried foods and eat more fruits and vegetables 4. Maintain adequate blood pressure, blood sugar, and blood cholesterol level. Reducing the risk of stroke and cardiovascular disease also helps promoting better memory. 5. Avoid stressful situations. Live a simple life and avoid aggravations. Organize your time and prepare for the next day in anticipation. 6. Sleep well, avoid any interruptions of sleep and avoid any distractions in the bedroom that may interfere with adequate sleep quality 7. Avoid sugar, avoid sweets as there is a strong link between excessive sugar intake, diabetes, and cognitive impairment The Mediterranean diet has been shown to help patients reduce the risk of progressive memory disorders and reduces cardiovascular risk. This includes eating fish, eat fruits and green leafy vegetables, nuts like almonds and hazelnuts, walnuts, and also use olive oil. Avoid fast foods and fried foods as much as possible. Avoid sweets and sugar as sugar use has been linked to worsening of memory function.  There is always a concern of gradual progression of memory problems. If this is the case, then we may need to adjust level of care according to patient needs. Support, both to the patient and caregiver, should then be put into place.

## 2021-10-14 ENCOUNTER — Other Ambulatory Visit: Payer: Self-pay | Admitting: Neurology

## 2021-10-17 ENCOUNTER — Other Ambulatory Visit: Payer: Self-pay | Admitting: Neurology

## 2021-10-24 DIAGNOSIS — R3121 Asymptomatic microscopic hematuria: Secondary | ICD-10-CM | POA: Diagnosis not present

## 2021-11-01 ENCOUNTER — Ambulatory Visit: Payer: Medicare Other | Admitting: Neurology

## 2021-11-08 ENCOUNTER — Telehealth: Payer: Self-pay | Admitting: Family Medicine

## 2021-11-08 NOTE — Telephone Encounter (Signed)
Copied from CRM [id number]. Topic: Medicare AWV >> Nov 08, 2021 11:21 AM Harris-Coley, [place]on Gully wrote: Reason for CRM: Attempted to schedule AWV. Unable to LVM.  Will try at later time.

## 2021-11-13 ENCOUNTER — Other Ambulatory Visit: Payer: Self-pay | Admitting: Neurology

## 2021-11-17 ENCOUNTER — Encounter: Payer: Self-pay | Admitting: Neurology

## 2021-11-17 ENCOUNTER — Other Ambulatory Visit: Payer: Self-pay

## 2021-11-17 ENCOUNTER — Telehealth: Payer: Self-pay

## 2021-11-17 ENCOUNTER — Ambulatory Visit: Payer: Medicare Other | Admitting: Neurology

## 2021-11-17 VITALS — BP 125/78 | HR 70 | Ht 64.0 in | Wt 124.8 lb

## 2021-11-17 DIAGNOSIS — G3183 Dementia with Lewy bodies: Secondary | ICD-10-CM

## 2021-11-17 DIAGNOSIS — F02818 Dementia in other diseases classified elsewhere, unspecified severity, with other behavioral disturbance: Secondary | ICD-10-CM

## 2021-11-17 DIAGNOSIS — R531 Weakness: Secondary | ICD-10-CM | POA: Diagnosis not present

## 2021-11-17 NOTE — Patient Instructions (Signed)
Good to see you.  Continue all your medications  2. Referral will be sent home physical therapy, home aide  3. Look into going back to [facility] or day program  4. Follow-up in 6 months, call for any changes   FALL PRECAUTIONS: Be cautious when walking. Scan the area for obstacles that may increase the risk of trips and falls. When getting up in the mornings, sit up at the edge of the bed for a few minutes before getting out of bed. Consider elevating the bed at the head end to avoid drop of blood pressure when getting up. Walk always in a well-lit room (use night lights in the walls). Avoid area rugs or power cords from appliances in the middle of the walkways. Use a walker or a cane if necessary and consider physical therapy for balance exercise. Get your eyesight checked regularly.  HOME SAFETY: Consider the safety of the kitchen when operating appliances like stoves, microwave oven, and blender. Consider having supervision and share cooking responsibilities until no longer able to participate in those. Accidents with firearms and other hazards in the house should be identified and addressed as well.  ABILITY TO BE LEFT ALONE: If patient is unable to contact 911 operator, consider using LifeLine, or when the need is there, arrange for someone to stay with patients. Smoking is a fire hazard, consider supervision or cessation. Risk of wandering should be assessed by caregiver and if detected at any point, supervision and safe proof recommendations should be instituted.   RECOMMENDATIONS FOR ALL PATIENTS WITH MEMORY PROBLEMS: 1. Continue to exercise (Recommend 30 minutes of walking everyday, or 3 hours every week) 2. Increase social interactions - continue going to [place] and enjoy social gatherings with friends and family 3. Eat healthy, avoid fried foods and eat more fruits and vegetables 4. Maintain adequate blood pressure, blood sugar, and blood cholesterol level. Reducing the risk of  stroke and cardiovascular disease also helps promoting better memory. 5. Avoid stressful situations. Live a simple life and avoid aggravations. Organize your time and prepare for the next day in anticipation. 6. Sleep well, avoid any interruptions of sleep and avoid any distractions in the bedroom that may interfere with adequate sleep quality 7. Avoid sugar, avoid sweets as there is a strong link between excessive sugar intake, diabetes, and cognitive impairment The Mediterranean diet has been shown to help patients reduce the risk of progressive memory disorders and reduces cardiovascular risk. This includes eating fish, eat fruits and green leafy vegetables, nuts like almonds and hazelnuts, walnuts, and also use olive oil. Avoid fast foods and fried foods as much as possible. Avoid sweets and sugar as sugar use has been linked to worsening of memory function.  There is always a concern of gradual progression of memory problems. If this is the case, then we may need to adjust level of care according to patient needs. Support, both to the patient and caregiver, should then be put into place.

## 2021-11-17 NOTE — Telephone Encounter (Signed)
[facility] has accepted for home health

## 2021-11-17 NOTE — Progress Notes (Signed)
NEUROLOGY FOLLOW UP OFFICE NOTE  Julia Palmer [id number] Oct 29, 1940  HISTORY OF PRESENT ILLNESS: I had the pleasure of seeing Julia Palmer in follow-up in the neurology clinic on 11/17/2021. She is accompanied by her husband and daughter Julia Palmer who help supplement the history today. The patient was last seen 2 months ago as an urgent visit after ER visit in 09/2021 for generalized weakness, cystitis. She was referred for home health however they report that they had not heard from [facility]. She is still feeling weak, no falls. Family is concerned about her balance. She does not use any assistive devices, they have to hold on to her. Her husband has to help her to the bathroom and bathe her, he reports that physically she cannot go up and down. She sometimes has difficulties controlling her movements. She continues to have daily hallucinations, this morning she reports seeing a little girl on a stepladder. She told her husband to look at the window, then it was gone when she looked again. She also states there are 3 to 5 different men coming to the house, they all look like her husband but she knows they are not her husband. She has given them the recipe for beans or rum cake. She and her husband have a system for medications, she takes them independently. She does not drive. She has been sleeping in her chair, having a hard time at night. Her last fall was in November. She denies any significant headaches, no dizziness. She has gained weight drinking Ensure. She has declined going to [employer] thinking it is for "babysitting." She is on Donepezil 10mg  daily, Memantine 10mg  BID, and Lexapro 10mg  daily without side effects.   History on Initial Assessment 12/16/2018: This is a pleasant 81 year old right-handed woman with a history of osteoporosis presenting for evaluation of worsening memory.  She states her memory "can leave something to be desired." She has noticed word-finding difficulties, she tries  to answer but sometimes the words don't come until 30 minutes later. Her husband started noticing changes around a year ago with general confusion "with a little bit of everything." She could not get the checkbooks balanced so he took over finances. Sometimes she is not sure how to write a check, worse the past 6 months. She would not remember where she left her phone or pocketbook. She has had difficulties using the TV remote control and the auto seat adjustment in her car, pushing a button and saying it was not doing what she wants for him, but working for her husband. Her husband does majority of driving. She is independent with dressing and bathing but occasionally may not remember what she did with the clothes she took off. She spends a lot of time on the computer but would get confused and frustrated. She prints out emails for him, sometimes making 2 to 3 copies because she forgets she already did it. She used to knit all the time but for a time forgot how to do it. Her husband has watched her knit and saw her take an hour to do it, she would leave holes and repeat the process, frustrating her. She manages her own medications and denies missing medications.   Her husband has noticed that over the past 5 years she would holller out loudly in her sleep, almost like someone is chasing her. She would act out her dreams. He got her an over the counter sleep aid with melatonin and has noticed these quiet down.  Over the past 4-5 months, she has had visual hallucinations seeing a lady with 3 children outside on the street, stating they are there every morning. Her husband and neighbor have not seen these people. She has occasionally seen something black go behind her husband when he moved his arm. She has never been able to smell for years. No tremors. She has had brief double vision, one time she was knitting and saw 3 needles. She has occasional diarrhea, no constipation. She has Reynaud's syndrome with tingling  in her fingers when cold. She has back pain. She rarely has headaches, no dizziness, dysarthria/dysphagia, neck pain, focal numbness/tingling/weakness, no falls. No paranoia, she is a little more irritable and frustrated with herself. Her mother had dementia. No history of significant head injury or alcohol use.   PAST MEDICAL HISTORY: Past Medical History:  Diagnosis Date   Arthritis    Compression fracture of first lumbar vertebra (HCC)    Iron deficiency anemia    Macular degeneration    Osteoporosis    Wears glasses     MEDICATIONS: Current Outpatient Medications on File Prior to Visit  Medication Sig Dispense Refill   alendronate (FOSAMAX) 70 MG tablet Take 70 mg by mouth every Wednesday. Take with a full glass of water on an empty stomach.     cholecalciferol (VITAMIN D3) 25 MCG (1000 UNIT) tablet Take 1,000 Units by mouth daily.     donepezil (ARICEPT) 10 MG tablet TAKE 1 TABLET BY MOUTH DAILY 90 tablet 3   escitalopram (LEXAPRO) 10 MG tablet TAKE 1 TABLET(10 MG) BY MOUTH AT BEDTIME 30 tablet 0   fluticasone (FLONASE) 50 MCG/ACT nasal spray Place 2 sprays into both nostrils daily. 16 g 3   furosemide (LASIX) 40 MG tablet Take 1 tablet (40 mg total) by mouth as needed. (Patient taking differently: Take 40 mg by mouth daily.) 30 tablet 4   Magnesium 250 MG TABS Take 250 mg by mouth daily.      memantine (NAMENDA) 10 MG tablet TAKE 1 TABLET BY MOUTH IN THE MORNING AND IN THE EVENING 180 tablet 3   Multiple Vitamins-Minerals (ICAPS AREDS 2 PO) Take by mouth.     potassium chloride (KLOR-CON) 10 MEQ tablet Take 10 mEq by mouth daily.     potassium chloride (K-DUR,KLOR-CON) 10 MEQ tablet take 1 tablet by mouth once daily as needed-with lasix (Patient taking differently: Take 10 mEq by mouth daily.) 30 tablet 4   No current facility-administered medications on file prior to visit.    ALLERGIES: Allergies  Allergen Reactions   Codeine Nausea And Vomiting    REACTION: Nausea,  Vomitting    FAMILY HISTORY: Family History  Problem Relation Age of Onset   Dementia Mother    Stroke Father        early 5460s, smoker   Kidney disease Brother    Cancer Sister        breast    SOCIAL HISTORY: Social History   Socioeconomic History   Marital status: Married    Spouse name: Not on file   Number of children: 1   Years of education: Not on file   Highest education level: Not on file  Occupational History   Occupation: Engineer, productioneal Estate Agent    Employer: OTHER  Tobacco Use   Smoking status: Never   Smokeless tobacco: Never  Vaping Use   Vaping Use: Never used  Substance and Sexual Activity   Alcohol use: No   Drug use: No  Sexual activity: Yes    Partners: Male  Other Topics Concern   Not on file  Social History Narrative   Pt is R handed   Lives in 2 story home with her husband, Rosanne Ashing   Married 1964   1 adult child   Some college education   Retired Customer service manager   Social Determinants of Corporate investment banker Strain: Not on Ship broker Insecurity: Not on file  Transportation Needs: Not on file  Physical Activity: Not on file  Stress: Not on file  Social Connections: Not on file  Intimate Partner Violence: Not on file     PHYSICAL EXAM: Vitals:   11/17/21 1058  BP: 125/78  Pulse: 70  SpO2: 100%   General: No acute distress, hypomimia Head:  Normocephalic/atraumatic Skin/Extremities: No rash, no edema Neurological Exam: alert and awake. No aphasia or dysarthria. Fund of knowledge is appropriate. Attention and concentration are normal.   Cranial nerves: Pupils equal, round. Extraocular movements intact.  No facial asymmetry.  Motor: moves all extremities symmetrically. No clear tremor today. Gait slow and cautious with small steps and absent arm swing.     IMPRESSION: This is a pleasant 81 yo RH woman with a history of osteoporosis with worsening memory,visual hallucinations, and symptoms suggestive of REM behavioral disturbance,  with parkinsonian signs on exam, likely Lewy Body dementia.  MRI brain in June 2020 reported mild frontal atrophy, mild chronic microvascular disease, no acute changes. She is having more physical difficulties, worse since ER visit in 09/2021. Proceed with home physical therapy. Her husband is needing more assistance to help with ADLs, home health aide will also be ordered. We discussed the continued hallucinations, they are non-threatening, continue with non-pharmacologic strategies, we may consider Nuplazid or Seroquel in the future if needed. Continue Donepezil 10mg  daily, Memantine 10mg  BID, and Lexapro 10mg  daily. Continue close supervision. She does not drive. She was encouraged to increase physical activities, social activities, which she declines today. Follow-up in 6 months with [facility] , call for any changes.    Thank you for allowing me to participate in her care.  Please do not hesitate to call for any questions or concerns.    , M.D.   CC: Dr. 

## 2021-11-23 DIAGNOSIS — R319 Hematuria, unspecified: Secondary | ICD-10-CM | POA: Diagnosis not present

## 2021-11-23 DIAGNOSIS — R3121 Asymptomatic microscopic hematuria: Secondary | ICD-10-CM | POA: Diagnosis not present

## 2021-11-30 DIAGNOSIS — N200 Calculus of kidney: Secondary | ICD-10-CM | POA: Diagnosis not present

## 2021-11-30 DIAGNOSIS — N132 Hydronephrosis with renal and ureteral calculous obstruction: Secondary | ICD-10-CM | POA: Diagnosis not present

## 2021-11-30 DIAGNOSIS — R3121 Asymptomatic microscopic hematuria: Secondary | ICD-10-CM | POA: Diagnosis not present

## 2021-12-05 ENCOUNTER — Other Ambulatory Visit: Payer: Self-pay | Admitting: Urology

## 2021-12-05 NOTE — Progress Notes (Signed)
Pre op phone call completed with spouse.  Pt is aware of meds to take before procedure. NPO after 1000.  No solids after 0800.

## 2021-12-07 ENCOUNTER — Encounter (HOSPITAL_BASED_OUTPATIENT_CLINIC_OR_DEPARTMENT_OTHER)
Admission: RE | Disposition: A | Discharge status: Home / Self Care | Payer: Self-pay | Source: Home / Self Care | Attending: Urology

## 2021-12-07 ENCOUNTER — Encounter (HOSPITAL_BASED_OUTPATIENT_CLINIC_OR_DEPARTMENT_OTHER): Payer: Self-pay | Admitting: Urology

## 2021-12-07 ENCOUNTER — Ambulatory Visit (HOSPITAL_COMMUNITY): Payer: Medicare Other

## 2021-12-07 ENCOUNTER — Ambulatory Visit (HOSPITAL_BASED_OUTPATIENT_CLINIC_OR_DEPARTMENT_OTHER)
Admission: RE | Admit: 2021-12-07 | Discharge: 2021-12-07 | Disposition: A | Discharge status: Home / Self Care | Payer: Medicare Other | Attending: Urology | Admitting: Urology

## 2021-12-07 DIAGNOSIS — N200 Calculus of kidney: Secondary | ICD-10-CM | POA: Insufficient documentation

## 2021-12-07 HISTORY — PX: EXTRACORPOREAL SHOCK WAVE LITHOTRIPSY: SHX1557

## 2021-12-07 HISTORY — DX: Unspecified dementia, unspecified severity, without behavioral disturbance, psychotic disturbance, mood disturbance, and anxiety: F03.90

## 2021-12-07 SURGERY — LITHOTRIPSY, ESWL
Anesthesia: LOCAL | Laterality: Right

## 2021-12-07 MED ORDER — DIAZEPAM 5 MG PO TABS
ORAL_TABLET | ORAL | Status: AC
  Filled 2021-12-07: qty 2

## 2021-12-07 MED ORDER — TRAMADOL HCL 50 MG PO TABS
50.0000 mg | ORAL_TABLET | Freq: Four times a day (QID) | ORAL | 0 refills | Status: DC | PRN
Start: 2021-12-07 — End: 2022-07-04

## 2021-12-07 MED ORDER — DIPHENHYDRAMINE HCL 25 MG PO CAPS
ORAL_CAPSULE | ORAL | Status: AC
  Filled 2021-12-07: qty 1

## 2021-12-07 MED ORDER — TAMSULOSIN HCL 0.4 MG PO CAPS
0.4000 mg | ORAL_CAPSULE | Freq: Every day | ORAL | 1 refills | Status: DC
Start: 2021-12-07 — End: 2022-07-04

## 2021-12-07 MED ORDER — DIAZEPAM 5 MG PO TABS: 10.0000 mg | ORAL_TABLET | ORAL | Status: DC

## 2021-12-07 MED ORDER — DIPHENHYDRAMINE HCL 25 MG PO CAPS
25.0000 mg | ORAL_CAPSULE | ORAL | Status: AC
  Administered 2021-12-07: 25 mg via ORAL

## 2021-12-07 MED ORDER — SODIUM CHLORIDE 0.9 % IV SOLN: INTRAVENOUS | Status: DC

## 2021-12-07 MED ORDER — DIAZEPAM 5 MG PO TABS
5.0000 mg | ORAL_TABLET | Freq: Once | ORAL | Status: AC
  Administered 2021-12-07: 5 mg via ORAL

## 2021-12-07 MED ORDER — CIPROFLOXACIN HCL 500 MG PO TABS
ORAL_TABLET | ORAL | Status: AC
  Filled 2021-12-07: qty 1

## 2021-12-07 MED ORDER — CEPHALEXIN 500 MG PO CAPS
500.0000 mg | ORAL_CAPSULE | Freq: Two times a day (BID) | ORAL | 0 refills | Status: DC
Start: 2021-12-07 — End: 2022-07-04

## 2021-12-07 MED ORDER — CIPROFLOXACIN HCL 500 MG PO TABS
500.0000 mg | ORAL_TABLET | ORAL | Status: AC
  Administered 2021-12-07: 500 mg via ORAL

## 2021-12-07 MED ORDER — CELECOXIB 100 MG PO CAPS: 100.0000 mg | ORAL_CAPSULE | Freq: Two times a day (BID) | ORAL | 1 refills | Status: AC | PRN

## 2021-12-07 NOTE — Discharge Instructions (Addendum)
1. You should strain your urine and collect all fragments and bring them to your follow up appointment.  2. You should take your pain medication as needed.  Please call if your pain is severe to the point that it is not controlled with your pain medication. 3. You should call if you develop fever > 101 or persistent nausea or vomiting. 4. Your doctor may prescribe tamsulosin to take to help facilitate stone passage.    Post Anesthesia Home Care Instructions  Activity: Get plenty of rest for the remainder of the day. A responsible individual must stay with you for 24 hours following the procedure.  For the next 24 hours, DO NOT: -Drive a car -Operate machinery -Drink alcoholic beverages -Take any medication unless instructed by your physician -Make any legal decisions or sign important papers.  Meals: Start with liquid foods such as gelatin or soup. Progress to regular foods as tolerated. Avoid greasy, spicy, heavy foods. If nausea and/or vomiting occur, drink only clear liquids until the nausea and/or vomiting subsides. Call your physician if vomiting continues.  Special Instructions/Symptoms: Your throat may feel dry or sore from the anesthesia or the breathing tube placed in your throat during surgery. If this causes discomfort, gargle with warm salt water. The discomfort should disappear within 24 hours.  

## 2021-12-07 NOTE — Progress Notes (Signed)
Patient returned back to department with petechiae on right flank from lithotripsy.

## 2021-12-07 NOTE — Op Note (Signed)
See [employer] operative note scanned into chart. Also because of the size, density, location and other factors that cannot be anticipated I feel this will likely be a staged procedure. This fact supersedes any indication in the scanned [place] stone operative note to the contrary.  Irine Seal MD 12/07/2021, 3:19 PM  [facility]  Pager: [phone]

## 2021-12-07 NOTE — H&P (Signed)
H&P  History of Present Illness: Julia Palmer is a 81 y.o. year old with a ~1.5 cm proximal right ureteral stone who presents today for R ESWL  No acute complaints  Past Medical History:  Diagnosis Date   Arthritis    Compression fracture of first lumbar vertebra (HCC)    Dementia (HCC)    Iron deficiency anemia    Macular degeneration    Osteoporosis    Wears glasses     Past Surgical History:  Procedure Laterality Date   BUNIONECTOMY WITH WEIL OSTEOTOMY Left 05/27/2014   Procedure: Left First Metatarsal Scarf Osteotomy, Second Metatarsal Weil, Modified Mcbride Bunionectomy, Hammertoe Correction;  Surgeon: Toni Arthurs, MD;  Location: [facility];  Service: Orthopedics;  Laterality: Left;   CARDIAC CATHETERIZATION     2012   COLONOSCOPY     FOOT SURGERY  2015,2000   bilateral   GANGLION CYST EXCISION  1963   wrist-lt   KYPHOPLASTY N/A 07/16/2019   Procedure: LUMBAR 1 KYPHOPLASTY;  Surgeon: Venita Lick, MD;  Location: [facility] OR;  Service: Orthopedics;  Laterality: N/A;  60 mins   TUBAL LIGATION      Home Medications:  Current Meds  Medication Sig   alendronate (FOSAMAX) 70 MG tablet Take 70 mg by mouth every Wednesday. Take with a full glass of water on an empty stomach.   Biotin 5 MG CAPS Take by mouth.   cholecalciferol (VITAMIN D3) 25 MCG (1000 UNIT) tablet Take 1,000 Units by mouth daily.   donepezil (ARICEPT) 10 MG tablet TAKE 1 TABLET BY MOUTH DAILY   escitalopram (LEXAPRO) 10 MG tablet TAKE 1 TABLET(10 MG) BY MOUTH AT BEDTIME   furosemide (LASIX) 40 MG tablet Take 1 tablet (40 mg total) by mouth as needed. (Patient taking differently: Take 40 mg by mouth daily.)   memantine (NAMENDA) 10 MG tablet TAKE 1 TABLET BY MOUTH IN THE MORNING AND IN THE EVENING   Multiple Vitamins-Minerals (ICAPS AREDS 2 PO) Take by mouth.    Allergies:  Allergies  Allergen Reactions   Codeine Nausea And Vomiting    REACTION: Nausea, Vomitting    Family History   Problem Relation Age of Onset   Dementia Mother    Stroke Father        early 66s, smoker   Kidney disease Brother    Cancer Sister        breast    Social History:  reports that she has never smoked. She has never used smokeless tobacco. She reports that she does not drink alcohol and does not use drugs.  ROS: A complete review of systems was performed.  All systems are negative except for pertinent findings as noted.  Physical Exam:  Vital signs in last 24 hours:   Constitutional:  Alert and oriented, No acute distress Cardiovascular: Regular rate and rhythm, No JVD Respiratory: Normal respiratory effort, Lungs clear bilaterally GI: Abdomen is soft, nontender, nondistended, no abdominal masses GU: No CVA tenderness Lymphatic: No lymphadenopathy Neurologic: Grossly intact, no focal deficits Psychiatric: Normal mood and affect   Laboratory Data:  No results for input(s): WBC, HGB, HCT, PLT in the last 72 hours.  No results for input(s): NA, K, CL, GLUCOSE, BUN, CALCIUM, CREATININE in the last 72 hours.  Invalid input(s): CO3   No results found for this or any previous visit (from the past 24 hour(s)). No results found for this or any previous visit (from the past 240 hour(s)).  Renal Function: No results for  input(s): CREATININE in the last 168 hours. CrCl cannot be calculated (Patient's most recent lab result is older than the maximum 21 days allowed.).  Radiologic Imaging: No results found.  Assessment:  81 yo F with large R proximal ureteral stone  Plan:  To OR for R ESWL. I reviewed risks of the procedure with the patient and her husband Julia Palmer.   Irine Seal, MD 12/07/2021, 1:05 PM  [facility] Pager: [phone]

## 2021-12-08 ENCOUNTER — Encounter (HOSPITAL_BASED_OUTPATIENT_CLINIC_OR_DEPARTMENT_OTHER): Payer: Self-pay | Admitting: Urology

## 2021-12-21 DIAGNOSIS — N200 Calculus of kidney: Secondary | ICD-10-CM | POA: Diagnosis not present

## 2022-01-01 ENCOUNTER — Other Ambulatory Visit: Payer: Self-pay | Admitting: Neurology

## 2022-02-01 DIAGNOSIS — N200 Calculus of kidney: Secondary | ICD-10-CM | POA: Diagnosis not present

## 2022-02-19 ENCOUNTER — Telehealth: Payer: Self-pay | Admitting: Physician Assistant

## 2022-02-19 NOTE — Telephone Encounter (Signed)
Patient thanked me for calling, he will contact oral surgeon for details. ?

## 2022-02-19 NOTE — Telephone Encounter (Signed)
Patients husband Fayrene Fearing called. Kiamesha has oral surgery tomorrow and he is concerned. He is concerned due to them putting her to sleep. He wants to know what to expect once she comes out of surgery.  ?

## 2022-02-19 NOTE — Telephone Encounter (Signed)
Patient needs to contact oral surgery  ?

## 2022-05-09 NOTE — Telephone Encounter (Signed)
error 

## 2022-05-23 ENCOUNTER — Encounter: Payer: Self-pay | Admitting: Physician Assistant

## 2022-05-23 ENCOUNTER — Ambulatory Visit: Payer: Medicare Other | Admitting: Physician Assistant

## 2022-05-23 VITALS — BP 110/70 | HR 58 | Resp 20 | Ht 64.0 in

## 2022-05-23 DIAGNOSIS — F02818 Dementia in other diseases classified elsewhere, unspecified severity, with other behavioral disturbance: Secondary | ICD-10-CM

## 2022-05-23 DIAGNOSIS — G3183 Dementia with Lewy bodies: Secondary | ICD-10-CM

## 2022-05-23 DIAGNOSIS — F02A0 Dementia in other diseases classified elsewhere, mild, without behavioral disturbance, psychotic disturbance, mood disturbance, and anxiety: Secondary | ICD-10-CM

## 2022-05-23 NOTE — Progress Notes (Signed)
Assessment/Plan:   Dementia likely due to  Julia Palmer is a very pleasant 81 y.o. RH female seen today in follow up for memory loss. Patient is currently on   Recommendations:    Continue  10 mg   Side effects were discussed Follow up in   months.   Case discussed with Dr. Karel Jarvis who agrees with the plan  This is a pleasant 81 yo RH woman with a history of osteoporosis with worsening memory,visual hallucinations, and symptoms suggestive of REM behavioral disturbance, with parkinsonian signs on exam, likely Lewy Body dementia.  MRI brain in June 2020 reported mild frontal atrophy, mild chronic microvascular disease, no acute changes. She is having more physical difficulties, worse since ER visit in 09/2021. Proceed with home physical therapy. Her husband is needing more assistance to help with ADLs, home health aide will also be ordered. We discussed the continued hallucinations, they are non-threatening, continue with non-pharmacologic strategies, we may consider Nuplazid or Seroquel in the future if needed. Continue Donepezil 10mg  daily, Memantine 10mg  BID, and Lexapro 10mg  daily. Continue close supervision. She does not drive. She was encouraged to increase physical activities, social activities, which she declines today. Follow-up in 6 months with [facility] , call for any changes.      Subjective:    This patient is accompanied in the office by  who supplements the history.  Previous records as well as any outside records available were reviewed prior to todays visit.    Any changes in memory since last visit? Dtr reports slight changes . None is getting better. Sometimes she doesn't recognize her H and she c Wah=sh dishes and, sits at , watches aod movies and sometimes she goes with him to run errand  Patient lives with: H repeats oneself?  Endorsed Disoriented when walking into a room?  Endorsed  Operating is difficutlt  Leaving objects in  unusual places?  Patient denies   Ambulates  with difficulty?   Knees hurt.  Has a hard time to walk. She shuffles everywhere, little steps.  Recent falls?  Patient denies   Any head injuries?  Patient denies   History of seizures?   Patient denies   Wandering behavior?  Patient denies   Patient drives?   Patient no longer drives  Any mood changes since last visit?  Patient denies   Any worsening depression?:  Patient denies   Hallucinations?  Patient denies  sees a boy in H lap Paranoia?  Patient denies   Patient reports that female  sleeps well without vivid dreams, REM behavior or sleepwalking  unless she is confused she doesn't want to go to bed.  History of sleep apnea?  Patient denies   Any hygiene concerns?  Patient denies   Independent of bathing and dressing?  Needs assistance. She is very modest  Does the patient needs help with medications?  Denies Who is in charge of the finances?   is in charge    Any changes in appetite?  Patient denies   Patient have trouble swallowing? Patient denies   Does the patient cook?  Patient denies   Any kitchen accidents such as leaving the stove on? Patient denies   Any headaches?  Patient denies   Double vision? Patient denies   Any focal numbness or tingling?  Patient denies   Chronic back pain Patient denies   Unilateral weakness?  Patient denies   Any tremors?  Patient denies   Any history  of anosmia?  Patient denies   Any incontinence of urine? Wears pullups  Any bowel dysfunction?   Patient denies         History on Initial Assessment 12/16/2018: This is a pleasant 81 year old right-handed woman with a history of osteoporosis presenting for evaluation of worsening memory.  She states her memory "can leave something to be desired." She has noticed word-finding difficulties, she tries to answer but sometimes the words don't come until 30 minutes later. Her husband started noticing changes around a year ago with general confusion "with a  little bit of everything." She could not get the checkbooks balanced so he took over finances. Sometimes she is not sure how to write a check, worse the past 6 months. She would not remember where she left her phone or pocketbook. She has had difficulties using the TV remote control and the auto seat adjustment in her car, pushing a button and saying it was not doing what she wants for him, but working for her husband. Her husband does majority of driving. She is independent with dressing and bathing but occasionally may not remember what she did with the clothes she took off. She spends a lot of time on the computer but would get confused and frustrated. She prints out emails for him, sometimes making 2 to 3 copies because she forgets she already did it. She used to knit all the time but for a time forgot how to do it. Her husband has watched her knit and saw her take an hour to do it, she would leave holes and repeat the process, frustrating her. She manages her own medications and denies missing medications.    Her husband has noticed that over the past 5 years she would holller out loudly in her sleep, almost like someone is chasing her. She would act out her dreams. He got her an over the counter sleep aid with melatonin and has noticed these quiet down. Over the past 4-5 months, she has had visual hallucinations seeing a lady with 3 children outside on the street, stating they are there every morning. Her husband and neighbor have not seen these people. She has occasionally seen something black go behind her husband when he moved his arm. She has never been able to smell for years. No tremors. She has had brief double vision, one time she was knitting and saw 3 needles. She has occasional diarrhea, no constipation. She has Reynaud's syndrome with tingling in her fingers when cold. She has back pain. She rarely has headaches, no dizziness, dysarthria/dysphagia, neck pain, focal numbness/tingling/weakness, no  falls. No paranoia, she is a little more irritable and frustrated with herself. Her mother had dementia. No history of significant head injury or alcohol use.    PREVIOUS MEDICATIONS:   CURRENT MEDICATIONS:  Outpatient Encounter Medications as of 05/23/2022  Medication Sig   alendronate (FOSAMAX) 70 MG tablet Take 70 mg by mouth every Wednesday. Take with a full glass of water on an empty stomach.   Biotin 5 MG CAPS Take by mouth.   cephALEXin (KEFLEX) 500 MG capsule Take 1 capsule (500 mg total) by mouth 2 (two) times daily.   cholecalciferol (VITAMIN D3) 25 MCG (1000 UNIT) tablet Take 1,000 Units by mouth daily.   donepezil (ARICEPT) 10 MG tablet TAKE 1 TABLET BY MOUTH DAILY   escitalopram (LEXAPRO) 10 MG tablet TAKE 1 TABLET(10 MG) BY MOUTH AT BEDTIME   furosemide (LASIX) 40 MG tablet Take 1 tablet (  40 mg total) by mouth as needed. (Patient taking differently: Take 40 mg by mouth daily.)   memantine (NAMENDA) 10 MG tablet TAKE 1 TABLET BY MOUTH IN THE MORNING AND IN THE EVENING   Multiple Vitamins-Minerals (ICAPS AREDS 2 PO) Take by mouth.   potassium chloride (K-DUR,KLOR-CON) 10 MEQ tablet take 1 tablet by mouth once daily as needed-with lasix (Patient taking differently: Take 10 mEq by mouth daily.)   potassium chloride (KLOR-CON) 10 MEQ tablet Take 10 mEq by mouth daily.   tamsulosin (FLOMAX) 0.4 MG CAPS capsule Take 1 capsule (0.4 mg total) by mouth daily after supper.   traMADol (ULTRAM) 50 MG tablet Take 1 tablet (50 mg total) by mouth every 6 (six) hours as needed.   No facility-administered encounter medications on file as of 05/23/2022.       10/03/2020    3:00 PM 09/23/2018   10:28 AM  MMSE - Mini Mental State Exam  Orientation to time 5 5  Orientation to Place 5 5  Registration 3 3  Attention/ Calculation 5 2  Recall 2 2  Language- name 2 objects 2 2  Language- repeat 1 1  Language- follow 3 step command 2 3  Language- read & follow direction 1 1  Write a sentence 1 1   Copy design 0 1  Total score 27 26      12/16/2018    9:00 AM  Montreal Cognitive Assessment   Visuospatial/ Executive (0/5) 4  Naming (0/3) 2  Attention: Read list of digits (0/2) 2  Attention: Read list of letters (0/1) 1  Attention: Serial 7 subtraction starting at 100 (0/3) 0  Language: Repeat phrase (0/2) 2  Language : Fluency (0/1) 0  Abstraction (0/2) 0  Delayed Recall (0/5) 4  Orientation (0/6) 6  Total 21    Objective:     PHYSICAL EXAMINATION:    VITALS:  There were no vitals filed for this visit.  GEN:  The patient appears stated age and is in NAD. HEENT:  Normocephalic, atraumatic.   Neurological examination:  General: NAD, well-groomed, appears stated age. Orientation: The patient is alert. Oriented to person, place and date Cranial nerves: There is good facial symmetry.The speech is fluent and clear. No aphasia or dysarthria. Fund of knowledge is appropriate. Recent and remote memory are impaired. Attention and concentration are reduced.  Able to name objects and repeat phrases.  Hearing is intact to conversational tone.    Sensation: Sensation is intact to light touch throughout Motor: Strength is at least antigravity x4. Tremors: none  DTR's 2/4 in UE/LE     Movement examination: Tone: There is normal tone in the UE/LE Abnormal movements:  no tremor.  No myoclonus.  No asterixis.   Coordination:  There is no decremation with RAM's. Normal finger to nose  Gait and Station: The patient has no difficulty arising out of a deep-seated chair without the use of the hands. The patient's stride length is good.  Gait is cautious and narrow.    Thank you for allowing [place] the opportunity to participate in the care of this nice patient. Please do not hesitate to contact us for any questions or concerns.   Total time spent on today's visit was *** minutes dedicated to this patient today, preparing to see patient, examining the patient, ordering tests and/or  medications and counseling the patient, documenting clinical information in the EHR or other health record, independently interpreting results and communicating results to the patient/family, discussing treatment  and goals, answering patient's questions and coordinating care.  Cc:  Shelva Majestic, MD  Marlowe Kays 05/23/2022 12:51 PM

## 2022-05-23 NOTE — Patient Instructions (Signed)
Good to see you.  Continue all your medications  2. Home Health Nursing   4. Follow-up in 6 months  Consider [employer] at [employer]    FALL PRECAUTIONS: Be cautious when walking. Scan the area for obstacles that may increase the risk of trips and falls. When getting up in the mornings, sit up at the edge of the bed for a few minutes before getting out of bed. Consider elevating the bed at the head end to avoid drop of blood pressure when getting up. Walk always in a well-lit room (use night lights in the walls). Avoid area rugs or power cords from appliances in the middle of the walkways. Use a walker or a cane if necessary and consider physical therapy for balance exercise. Get your eyesight checked regularly.  HOME SAFETY: Consider the safety of the kitchen when operating appliances like stoves, microwave oven, and blender. Consider having supervision and share cooking responsibilities until no longer able to participate in those. Accidents with firearms and other hazards in the house should be identified and addressed as well.  ABILITY TO BE LEFT ALONE: If patient is unable to contact 911 operator, consider using LifeLine, or when the need is there, arrange for someone to stay with patients. Smoking is a fire hazard, consider supervision or cessation. Risk of wandering should be assessed by caregiver and if detected at any point, supervision and safe proof recommendations should be instituted.   RECOMMENDATIONS FOR ALL PATIENTS WITH MEMORY PROBLEMS: 1. Continue to exercise (Recommend 30 minutes of walking everyday, or 3 hours every week) 2. Increase social interactions - continue going to [place] and enjoy social gatherings with friends and family 3. Eat healthy, avoid fried foods and eat more fruits and vegetables 4. Maintain adequate blood pressure, blood sugar, and blood cholesterol level. Reducing the risk of stroke and cardiovascular disease also helps promoting better memory. 5. Avoid  stressful situations. Live a simple life and avoid aggravations. Organize your time and prepare for the next day in anticipation. 6. Sleep well, avoid any interruptions of sleep and avoid any distractions in the bedroom that may interfere with adequate sleep quality 7. Avoid sugar, avoid sweets as there is a strong link between excessive sugar intake, diabetes, and cognitive impairment The Mediterranean diet has been shown to help patients reduce the risk of progressive memory disorders and reduces cardiovascular risk. This includes eating fish, eat fruits and green leafy vegetables, nuts like almonds and hazelnuts, walnuts, and also use olive oil. Avoid fast foods and fried foods as much as possible. Avoid sweets and sugar as sugar use has been linked to worsening of memory function.  There is always a concern of gradual progression of memory problems. If this is the case, then we may need to adjust level of care according to patient needs. Support, both to the patient and caregiver, should then be put into place.

## 2022-05-24 DIAGNOSIS — F02818 Dementia in other diseases classified elsewhere, unspecified severity, with other behavioral disturbance: Secondary | ICD-10-CM | POA: Insufficient documentation

## 2022-05-25 ENCOUNTER — Ambulatory Visit: Payer: Medicare Other | Admitting: Physician Assistant

## 2022-05-29 ENCOUNTER — Telehealth: Payer: Self-pay | Admitting: Physician Assistant

## 2022-05-29 NOTE — Telephone Encounter (Signed)
Patients husband called and asked if Julia Palmer would give him a call back.  He wanted to talk to her about a few things.  Please call [phone].

## 2022-05-29 NOTE — Telephone Encounter (Signed)
Spoke with husband. Thanked me for calling.

## 2022-05-30 ENCOUNTER — Other Ambulatory Visit: Payer: Self-pay | Admitting: Neurology

## 2022-07-02 ENCOUNTER — Telehealth: Payer: Self-pay | Admitting: Family Medicine

## 2022-07-02 NOTE — Telephone Encounter (Signed)
Willing to work pt in? 

## 2022-07-02 NOTE — Telephone Encounter (Signed)
Pt's husband states pt is having stomach pain with no other symptoms. Declined triage or ov with another provider. They are asking to see [facility] & only him. Please advise

## 2022-07-02 NOTE — Telephone Encounter (Signed)
See below

## 2022-07-04 ENCOUNTER — Encounter: Payer: Self-pay | Admitting: Family Medicine

## 2022-07-04 ENCOUNTER — Ambulatory Visit (INDEPENDENT_AMBULATORY_CARE_PROVIDER_SITE_OTHER): Payer: Medicare Other | Admitting: Family Medicine

## 2022-07-04 VITALS — BP 112/60 | HR 64 | Temp 97.8°F | Ht 64.0 in | Wt 119.4 lb

## 2022-07-04 DIAGNOSIS — R1032 Left lower quadrant pain: Secondary | ICD-10-CM

## 2022-07-04 DIAGNOSIS — Z23 Encounter for immunization: Secondary | ICD-10-CM | POA: Diagnosis not present

## 2022-07-04 DIAGNOSIS — R350 Frequency of micturition: Secondary | ICD-10-CM | POA: Diagnosis not present

## 2022-07-04 NOTE — Patient Instructions (Addendum)
Please stop by lab before you go If you have mychart- we will send your results within 3 business days of [place] receiving them.  If you do not have mychart- we will call you about results within 5 business days of [place] receiving them.  *please also note that you will see labs on mychart as soon as they post. I will later go in and write notes on them- will say "notes from Dr. Yong Channel"   Stat Ct Scan - rule out diverticulitis, evaluate for kidney stones- hopefully tomorrow  - tylenol 650 mg every 6 hours for now until scan  -flu shot today  Recommended follow up: Return for as needed for new, worsening, persistent symptoms. -if drastic worsening of symptoms- please go to emergency room

## 2022-07-04 NOTE — Progress Notes (Signed)
Phone [phone] In person visit   Subjective:   Julia Palmer is a 81 y.o. year old very pleasant female patient who presents for/with See problem oriented charting Chief Complaint  Patient presents with   Abdominal Pain    Pt c/o llq pain that started 2 weeks ago denies n/v, had a recent fall last week and is bruised on that side on her back   Past Medical History-  Patient Active Problem List   Diagnosis Date Noted   Mild dementia (HCC) 09/24/2018    Priority: High   Hyperlipidemia 08/06/2017    Priority: Medium    Osteoporosis 09/29/2007    Priority: Medium    Edema 09/29/2007    Priority: Medium    Lumbar stress fracture 03/13/2019    Priority: Low   Mid back pain 08/06/2017    Priority: Low   Macular degeneration 11/15/2014    Priority: Low   Raynaud phenomenon 08/24/2011    Priority: Low   Varicose veins of lower extremities with inflammation 08/21/2010    Priority: Low   Chest pain 01/12/2008    Priority: Low   Anemia 09/29/2007    Priority: Low   Lewy body dementia with behavioral disturbance (HCC) 05/24/2022   LFT elevation 08/06/2017    Medications- reviewed and updated Current Outpatient Medications  Medication Sig Dispense Refill   alendronate (FOSAMAX) 70 MG tablet Take 70 mg by mouth every Wednesday. Take with a full glass of water on an empty stomach.     amoxicillin (AMOXIL) 500 MG capsule TAKE 1 CAPSULE BY MOUTH THREE TIMES DAILY UNTIL ALL TAKEN     Biotin 5 MG CAPS Take by mouth.     cholecalciferol (VITAMIN D3) 25 MCG (1000 UNIT) tablet Take 1,000 Units by mouth daily.     donepezil (ARICEPT) 10 MG tablet TAKE 1 TABLET BY MOUTH DAILY 90 tablet 3   escitalopram (LEXAPRO) 10 MG tablet TAKE 1 TABLET(10 MG) BY MOUTH AT BEDTIME 30 tablet 4   furosemide (LASIX) 40 MG tablet Take 1 tablet (40 mg total) by mouth as needed. (Patient taking differently: Take 40 mg by mouth daily.) 30 tablet 4   memantine (NAMENDA) 10 MG tablet TAKE 1 TABLET BY MOUTH  IN THE MORNING AND IN THE EVENING 180 tablet 3   Multiple Vitamins-Minerals (ICAPS AREDS 2 PO) Take by mouth.     potassium chloride (K-DUR,KLOR-CON) 10 MEQ tablet take 1 tablet by mouth once daily as needed-with lasix (Patient taking differently: Take 10 mEq by mouth daily.) 30 tablet 4   No current facility-administered medications for this visit.     Objective:  BP 112/60   Pulse 64   Temp 97.8 F (36.6 C)   Ht 5\' 4"  (1.626 m)   Wt 119 lb 6.4 oz (54.2 kg)   SpO2 98%   BMI 20.49 kg/m  Gen: NAD, resting comfortably CV: RRR no murmurs rubs or gallops Lungs: CTAB no crackles, wheeze, rhonchi Abdomen: soft/nontender except for moderate pain in LLQ wrapping around side to lower portions of low back/nondistended/normal bowel sounds. No rebound or guarding.  Ext: no edema Skin: warm, dry Neuro: slowed cognition due to lewy body dementia    Assessment and Plan   #Left lower quadrant pain S: Started 2 weeks ago- can radiate around to the left low back (history of lumbar vertebrae compression fracture).  No nausea or vomiting.  Did have a fall last week but this was after pain had already started-is bruised on the left  side of her back. Can be severe pain. Still has appendix.  -on amoxicillin for oral surgery a few weeks ago- still on amoxicillin(off and on for 16 weeks). Not having as many bowel movements- went with soft foods and apple sauce and gas x and that seemed to help- back to having daily bowel movements- may need 2-3 BMs back to back for full evacuation -had phone call with GYN- did not see them- did not think GYN related though per discussion - no fever -some urinary frequency but clear -did have stone in April -not taking anything for pain A/P: Left lower quadrant pain for 2 weeks-need to rule out diverticulitis or side-lying appendicitis or referred pain from kidney stones (has history of this)-stat CT scan was ordered - We will get CBC/CMP today - Also get urinalysis and  urine culture evaluate for UTI -Does have history of compression fracture but do not strongly suspect radicular pain from thoracic or lumbar spine-hold off on back imaging for now - tylenol 650 mg every 6 hours for now until scan -off fosamax per oral surgeon for now- on hold   Recommended follow up: Return for as needed for new, worsening, persistent symptoms. Future Appointments  Date Time Provider [place]  08/28/2022 10:00 AM Marin Olp, MD [facility]  12/24/2022  2:00 PM Cameron Sprang, MD [facility] None   Lab/Order associations:   ICD-10-CM   1. Need for immunization against influenza  Z23 Flu Vaccine QUAD High Dose(Fluad)    2. Urinary frequency  R35.0 POCT Urinalysis Dipstick (Automated)    Urine Culture    3. Left lower quadrant abdominal pain  R10.32 POCT Urinalysis Dipstick (Automated)    Urine Culture    CBC with Differential/Platelet    Comprehensive metabolic panel    CT Abdomen Pelvis W Contrast    CANCELED: CT Abdomen Pelvis W Contrast     Time Spent: 20 minutes of total time (3:01 PM- 3:21 ) was spent on the date of the encounter performing the following actions: chart review prior to seeing the patient, obtaining history, performing a medically necessary exam, counseling on the workup and  treatment plan, placing orders, and documenting in our EHR.   Return precautions advised.  Garret Reddish, MD

## 2022-07-05 ENCOUNTER — Ambulatory Visit (HOSPITAL_BASED_OUTPATIENT_CLINIC_OR_DEPARTMENT_OTHER)
Admission: RE | Admit: 2022-07-05 | Discharge: 2022-07-05 | Disposition: A | Discharge status: Home / Self Care | Payer: Medicare Other | Source: Ambulatory Visit | Attending: Family Medicine | Admitting: Family Medicine

## 2022-07-05 ENCOUNTER — Other Ambulatory Visit: Payer: Self-pay | Admitting: Family Medicine

## 2022-07-05 ENCOUNTER — Encounter (HOSPITAL_BASED_OUTPATIENT_CLINIC_OR_DEPARTMENT_OTHER): Payer: Self-pay | Admitting: Radiology

## 2022-07-05 DIAGNOSIS — I700 Atherosclerosis of aorta: Secondary | ICD-10-CM | POA: Insufficient documentation

## 2022-07-05 DIAGNOSIS — R1032 Left lower quadrant pain: Secondary | ICD-10-CM | POA: Diagnosis not present

## 2022-07-05 DIAGNOSIS — N3289 Other specified disorders of bladder: Secondary | ICD-10-CM | POA: Diagnosis not present

## 2022-07-05 LAB — COMPREHENSIVE METABOLIC PANEL
ALT: 30 U/L (ref 0–35)
AST: 35 U/L (ref 0–37)
Albumin: 4.3 g/dL (ref 3.5–5.2)
Alkaline Phosphatase: 63 U/L (ref 39–117)
BUN: 30 mg/dL — ABNORMAL HIGH (ref 6–23)
CO2: 33 mEq/L — ABNORMAL HIGH (ref 19–32)
Calcium: 10.3 mg/dL (ref 8.4–10.5)
Chloride: 98 mEq/L (ref 96–112)
Creatinine, Ser: 0.97 mg/dL (ref 0.40–1.20)
GFR: 55.44 mL/min — ABNORMAL LOW (ref >60.00)
Glucose, Bld: 100 mg/dL — ABNORMAL HIGH (ref 70–99)
Potassium: 4 mEq/L (ref 3.5–5.1)
Sodium: 139 mEq/L (ref 135–145)
Total Bilirubin: 0.7 mg/dL (ref 0.2–1.2)
Total Protein: 8.2 g/dL (ref 6.0–8.3)

## 2022-07-05 LAB — POCT I-STAT CREATININE: Creatinine, Ser: 1 mg/dL (ref 0.44–1.00)

## 2022-07-05 LAB — CBC WITH DIFFERENTIAL/PLATELET
Basophils Absolute: 0.1 10*3/uL (ref 0.0–0.1)
Basophils Relative: 1.1 % (ref 0.0–3.0)
Eosinophils Absolute: 0.3 10*3/uL (ref 0.0–0.7)
Eosinophils Relative: 3.7 % (ref 0.0–5.0)
HCT: 39.6 % (ref 36.0–46.0)
Hemoglobin: 13.5 g/dL (ref 12.0–15.0)
Lymphocytes Relative: 23.5 % (ref 12.0–46.0)
Lymphs Abs: 1.7 10*3/uL (ref 0.7–4.0)
MCHC: 34.1 g/dL (ref 30.0–36.0)
MCV: 92.5 fl (ref 78.0–100.0)
Monocytes Absolute: 0.6 10*3/uL (ref 0.1–1.0)
Monocytes Relative: 8.8 % (ref 3.0–12.0)
Neutro Abs: 4.6 10*3/uL (ref 1.4–7.7)
Neutrophils Relative %: 62.9 % (ref 43.0–77.0)
Platelets: 285 10*3/uL (ref 150.0–400.0)
RBC: 4.29 Mil/uL (ref 3.87–5.11)
RDW: 14 % (ref 11.5–15.5)
WBC: 7.3 10*3/uL (ref 4.0–10.5)

## 2022-07-05 MED ORDER — AMOXICILLIN-POT CLAVULANATE 875-125 MG PO TABS: 1.0000 | ORAL_TABLET | Freq: Two times a day (BID) | ORAL | 0 refills | Status: DC

## 2022-07-05 MED ORDER — IOHEXOL 300 MG/ML  SOLN
100.0000 mL | Freq: Once | INTRAMUSCULAR | Status: AC | PRN
  Administered 2022-07-05: 65 mL via INTRAVENOUS

## 2022-07-06 LAB — URINE CULTURE
MICRO NUMBER:: 13944143
SPECIMEN QUALITY:: ADEQUATE

## 2022-07-13 ENCOUNTER — Telehealth: Payer: Self-pay | Admitting: Family Medicine

## 2022-07-13 NOTE — Telephone Encounter (Signed)
Patient's husband Jeneen Rinks requests to be called at ph# [phone] to discuss Patient's medications

## 2022-07-13 NOTE — Telephone Encounter (Signed)
Lm for pt tcb. 

## 2022-07-16 NOTE — Telephone Encounter (Signed)
Called and spoke with pt husband and he states pt has been in bed all weekend and she is not doing better (still in pain). They ran out of the Amoxicillin last night and wants to know if she needs another round of antibiotics and wants to know what are they next steps since she isn't doing any better.

## 2022-07-16 NOTE — Telephone Encounter (Signed)
Did she seem to make any improvement on the augmentin? If she did not we had discussed cipro/flagyl and I will send that in- please update me team  The other issue is that there could be cancer (would need colonoscopy), poor blood flow (can sometimes heal with time), or area of inflammation (GI would diagnose this but not sure that she/you would want her to have colonoscopy) but we can certainly trial the different antibiotic as we previously discussed

## 2022-07-17 MED ORDER — AMOXICILLIN-POT CLAVULANATE 875-125 MG PO TABS: 1.0000 | ORAL_TABLET | Freq: Two times a day (BID) | ORAL | 0 refills | Status: AC

## 2022-07-17 NOTE — Telephone Encounter (Signed)
Pt's spouse called the after hours line to state pt is still out of meds and scans should be ordered. Please advise

## 2022-07-17 NOTE — Telephone Encounter (Signed)
See below, called pt husband back and he states she got SOME better on the 1st antibiotic but will leave the decision up to you on if you send in different one or refill the existing antibiotic.

## 2022-07-17 NOTE — Telephone Encounter (Signed)
Called and spoke with pt husband he states pt was still asleep and he will cb once she wakes up and they have time to talk about this.

## 2022-07-17 NOTE — Telephone Encounter (Signed)
I am worried about side effects on Cipro and Flagyl-I sent in another 10 days of the Augmentin since she was improving on this at least some (please hold amoxicillin while she is on this)

## 2022-07-17 NOTE — Addendum Note (Signed)
Addended by: Marin Olp on: 07/17/2022 12:46 PM   Modules accepted: Orders

## 2022-07-17 NOTE — Telephone Encounter (Signed)
Caller States: -They would like choose the medication path instead of a colonoscopy.  Caller requests: -PCP team to call in medications to the pharmacy.

## 2022-07-18 NOTE — Telephone Encounter (Signed)
Would you please call her to let her know?

## 2022-07-18 NOTE — Telephone Encounter (Signed)
Called and spoke with pt husband and made aware.

## 2022-08-02 ENCOUNTER — Telehealth: Payer: Self-pay | Admitting: Family Medicine

## 2022-08-02 NOTE — Telephone Encounter (Signed)
Patient's husband Jeneen Rinks requests to be called at ph# [phone] re: Jeneen Rinks would like to speak with Dr. Ansel Bong Assistant about the effect Patient's medication is having on her. States Patient is sleeping during the day-not at night is one issue.

## 2022-08-03 ENCOUNTER — Encounter: Payer: Self-pay | Admitting: Family Medicine

## 2022-08-03 ENCOUNTER — Ambulatory Visit (INDEPENDENT_AMBULATORY_CARE_PROVIDER_SITE_OTHER): Payer: Medicare Other | Admitting: Family Medicine

## 2022-08-03 ENCOUNTER — Telehealth: Payer: Self-pay

## 2022-08-03 ENCOUNTER — Ambulatory Visit (INDEPENDENT_AMBULATORY_CARE_PROVIDER_SITE_OTHER)
Admission: RE | Admit: 2022-08-03 | Discharge: 2022-08-03 | Disposition: A | Discharge status: Home / Self Care | Payer: Medicare Other | Source: Ambulatory Visit | Attending: Family Medicine | Admitting: Family Medicine

## 2022-08-03 VITALS — BP 104/62 | HR 62 | Temp 98.2°F | Ht 64.0 in | Wt 124.0 lb

## 2022-08-03 DIAGNOSIS — R1012 Left upper quadrant pain: Secondary | ICD-10-CM

## 2022-08-03 DIAGNOSIS — J9811 Atelectasis: Secondary | ICD-10-CM | POA: Diagnosis not present

## 2022-08-03 DIAGNOSIS — K529 Noninfective gastroenteritis and colitis, unspecified: Secondary | ICD-10-CM | POA: Diagnosis not present

## 2022-08-03 DIAGNOSIS — J90 Pleural effusion, not elsewhere classified: Secondary | ICD-10-CM | POA: Diagnosis not present

## 2022-08-03 DIAGNOSIS — R1032 Left lower quadrant pain: Secondary | ICD-10-CM | POA: Diagnosis not present

## 2022-08-03 DIAGNOSIS — R3589 Other polyuria: Secondary | ICD-10-CM

## 2022-08-03 DIAGNOSIS — R079 Chest pain, unspecified: Secondary | ICD-10-CM | POA: Diagnosis not present

## 2022-08-03 LAB — CBC WITH DIFFERENTIAL/PLATELET
Basophils Absolute: 0.1 10*3/uL (ref 0.0–0.1)
Basophils Relative: 0.6 % (ref 0.0–3.0)
Eosinophils Absolute: 0.1 10*3/uL (ref 0.0–0.7)
Eosinophils Relative: 1.3 % (ref 0.0–5.0)
HCT: 34.8 % — ABNORMAL LOW (ref 36.0–46.0)
Hemoglobin: 11.6 g/dL — ABNORMAL LOW (ref 12.0–15.0)
Lymphocytes Relative: 14.5 % (ref 12.0–46.0)
Lymphs Abs: 1.3 10*3/uL (ref 0.7–4.0)
MCHC: 33.4 g/dL (ref 30.0–36.0)
MCV: 93.5 fl (ref 78.0–100.0)
Monocytes Absolute: 0.9 10*3/uL (ref 0.1–1.0)
Monocytes Relative: 9.8 % (ref 3.0–12.0)
Neutro Abs: 6.7 10*3/uL (ref 1.4–7.7)
Neutrophils Relative %: 73.8 % (ref 43.0–77.0)
Platelets: 236 10*3/uL (ref 150.0–400.0)
RBC: 3.72 Mil/uL — ABNORMAL LOW (ref 3.87–5.11)
RDW: 14.6 % (ref 11.5–15.5)
WBC: 9.1 10*3/uL (ref 4.0–10.5)

## 2022-08-03 LAB — COMPREHENSIVE METABOLIC PANEL
ALT: 140 U/L — ABNORMAL HIGH (ref 0–35)
AST: 77 U/L — ABNORMAL HIGH (ref 0–37)
Albumin: 4.2 g/dL (ref 3.5–5.2)
Alkaline Phosphatase: 76 U/L (ref 39–117)
BUN: 27 mg/dL — ABNORMAL HIGH (ref 6–23)
CO2: 26 mEq/L (ref 19–32)
Calcium: 9.8 mg/dL (ref 8.4–10.5)
Chloride: 101 mEq/L (ref 96–112)
Creatinine, Ser: 0.87 mg/dL (ref 0.40–1.20)
GFR: 63.14 mL/min (ref >60.00)
Glucose, Bld: 98 mg/dL (ref 70–99)
Potassium: 3.8 mEq/L (ref 3.5–5.1)
Sodium: 138 mEq/L (ref 135–145)
Total Bilirubin: 0.9 mg/dL (ref 0.2–1.2)
Total Protein: 7.5 g/dL (ref 6.0–8.3)

## 2022-08-03 MED ORDER — TRAMADOL HCL 50 MG PO TABS: 50.0000 mg | ORAL_TABLET | Freq: Three times a day (TID) | ORAL | 0 refills | Status: DC | PRN

## 2022-08-03 NOTE — Patient Instructions (Addendum)
Antibiotic- opted out for now GI consult- yes- placed as urgent Pain control- trial tramadol half tablet to start. Trial miralax half capful every other day mixed with water. If no bowel movement in 2 days go to half capful daily. Update me if still having issues 4. Frequent urination- urine culture today.   STOP lasix for now with peeing every 45 minutes to see if we can improve her comfort  STOP potassium  Please stop by lab before you go- just for urine If you have mychart- we will send your results within 3 business days of [place] receiving them.  If you do not have mychart- we will call you about results within 5 business days of [place] receiving them.  *please also note that you will see labs on mychart as soon as they post. I will later go in and write notes on them- will say "notes from Dr. Yong Channel"   Recommended follow up: Return in about 2 weeks (around 08/17/2022) for followup or sooner if needed.Schedule b4 you leave.

## 2022-08-03 NOTE — Telephone Encounter (Signed)
Dr. Stann Mainland called- patient declining- try to get her in at 23 20 today

## 2022-08-03 NOTE — Telephone Encounter (Signed)
-----   Message from Marin Olp, MD sent at 08/03/2022 12:45 PM EDT ----- Sent you teams message about getting set up for CXR

## 2022-08-03 NOTE — Telephone Encounter (Signed)
Pt seen in office today.

## 2022-08-03 NOTE — Progress Notes (Signed)
Phone [phone] In person visit   Subjective:   Julia Palmer is a 81 y.o. year old very pleasant female patient who presents for/with See problem oriented charting Chief Complaint  Patient presents with   Abdominal Pain    Pt c/o sharp abdominal pain, unable to eat or sleep . Any time pt moves around she has abdominal pain.    Past Medical History-  Patient Active Problem List   Diagnosis Date Noted   Mild dementia (HCC) 09/24/2018    Priority: High   Hyperlipidemia 08/06/2017    Priority: Medium    Osteoporosis 09/29/2007    Priority: Medium    Edema 09/29/2007    Priority: Medium    Lumbar stress fracture 03/13/2019    Priority: Low   Mid back pain 08/06/2017    Priority: Low   Macular degeneration 11/15/2014    Priority: Low   Raynaud phenomenon 08/24/2011    Priority: Low   Varicose veins of lower extremities with inflammation 08/21/2010    Priority: Low   Chest pain 01/12/2008    Priority: Low   Anemia 09/29/2007    Priority: Low   Aortic atherosclerosis (HCC) 07/05/2022   Lewy body dementia with behavioral disturbance (HCC) 05/24/2022   LFT elevation 08/06/2017    Medications- reviewed and updated Current Outpatient Medications  Medication Sig Dispense Refill   cholecalciferol (VITAMIN D3) 25 MCG (1000 UNIT) tablet Take 1,000 Units by mouth daily.     donepezil (ARICEPT) 10 MG tablet TAKE 1 TABLET BY MOUTH DAILY 90 tablet 3   escitalopram (LEXAPRO) 10 MG tablet TAKE 1 TABLET(10 MG) BY MOUTH AT BEDTIME 30 tablet 4   memantine (NAMENDA) 10 MG tablet TAKE 1 TABLET BY MOUTH IN THE MORNING AND IN THE EVENING 180 tablet 3   Multiple Vitamins-Minerals (ICAPS AREDS 2 PO) Take by mouth.     traMADol (ULTRAM) 50 MG tablet Take 1 tablet (50 mg total) by mouth every 8 (eight) hours as needed for up to 5 days. 15 tablet 0   No current facility-administered medications for this visit.     Objective:  BP 104/62 (BP Location: Left Arm, Patient Position: Sitting,  Cuff Size: Large)   Pulse 62   Temp 98.2 F (36.8 C) (Temporal)   Ht 5\' 4"  (1.626 m)   Wt 124 lb (56.2 kg)   SpO2 90%   BMI 21.28 kg/m  Gen: NAD, resting comfortably CV: RRR no murmurs rubs or gallops Lungs: CTAB no crackles, wheeze, rhonchi Abdomen: soft/nontender today/nondistended/normal bowel sounds. No rebound or guarding.  Ext: Minimal edema     Assessment and Plan   # ongoing pain in left lower abdomen - radiates into groin and into left upper quadrant S: Pain started in early September and had largely reassuring CBC and CMP as well as urinalysis and urine culture   We also ordered CT on 07/05/22 "Focal thickening of the distal sigmoid colon without surrounding abscess or free air. This finding is not seen on previous imaging. While this could represent a focal area of diverticulitis, there is a paucity of diverticulosis seen in the colon. Would could also consider focal colitis and suggest close follow-up to exclude underlying neoplasm."  - we attempted to treat with augmentin for 10 days x 2 and very minimal improvement -husband notes worsening of pain- she is less mobile, complains of pain more, not resting well due to pain - bowel movement normal- not straining and going daily   She is peeing frequently  on the Lasix which is affecting quality of life as she gets up every 45 minutes and is uncomfortable with movement.  Also pain is poorly controlled-tylenol no help.  A family friend who is a physician stopped by and saw patient's debilitated state and encouraged follow-up A/P: Worsening left lower quadrant abdominal pain with possible colitis as trigger that did not respond to Augmentin substantially.  She was nontender on exam today and we did not think she needed emergent evaluation as result.  Also has not lost further weight which is reassuring since she does not appear to be fluid overloaded Antibiotic- opted out for now-concerned about side effects with  Cipro/Flagyl GI consult- yes- placed as urgent.  We had held previously but was lack of improvement in how substantially this is affecting quality of life will refer at this time (has been has been aware this could be potential malignancy but at her age and frailty and not knowing if she could tolerate a procedure we held off on referral initially Pain control- trial tramadol half tablet to start. Trial miralax half capful every other day mixed with water. If no bowel movement in 2 days go to half capful daily. Update me if still having issues 4. Frequent urination- urine culture today. STOP lasix and potassium (has only been prescribed for edema 5.  Long-term care-getting challenging for her husband to care for her in the home-could place referral for home health and social work and home health aide (but we frequently have issues with either coverage or availability of aids and we opted to hold off for now)-reconsider at follow-up but I was honest may be difficult to obtain  We also discussed if worsening symptoms can seek care in the emergency room to more acutely work-up issues -Could also consider palliative consult  No respiratory complaints but after visit noted  pulse ox lower- will have team obtain chest x-ray  #Minimize medication - Had been on alendronate in the past and no longer on-remove from medication list-can cause GI issues so thankful not taking - Also stop biotin -Considered holding donepezil or memantine but with possible underlying cause and planned GI work-up-hold off for now  Recommended follow up: Return in about 2 weeks (around 08/17/2022) for followup or sooner if needed.Schedule b4 you leave. Future Appointments  Date Time Provider [place]  08/28/2022 10:00 AM Marin Olp, MD [facility]  12/24/2022  2:00 PM Cameron Sprang, MD [facility] None    Lab/Order associations:   ICD-10-CM   1. Colitis  K52.9 Ambulatory referral to Gastroenterology    2. LLQ  pain  R10.32 Ambulatory referral to Gastroenterology    Urine Culture    CBC with Differential/Platelet    Comprehensive metabolic panel    3. Polyuria  R35.89 Urine Culture      Meds ordered this encounter  Medications   traMADol (ULTRAM) 50 MG tablet    Sig: Take 1 tablet (50 mg total) by mouth every 8 (eight) hours as needed for up to 5 days.    Dispense:  15 tablet    Refill:  0    Time Spent: 31 minutes of total time (11:49 AM-12:30 PM) was spent on the date of the encounter performing the following actions: chart review prior to seeing the patient, obtaining history, performing a medically necessary exam, counseling on the treatment plan as well as potential for malignancy and balancing this with desire to focus on quality of life, placing orders, and documenting in our  EHR.    Return precautions advised.  Tana Conch, MD

## 2022-08-03 NOTE — Telephone Encounter (Signed)
I contacted pt in regards to CXR, Pt is on the way there now.

## 2022-08-04 LAB — URINE CULTURE
MICRO NUMBER:: 14079914
SPECIMEN QUALITY:: ADEQUATE

## 2022-08-06 ENCOUNTER — Inpatient Hospital Stay (HOSPITAL_COMMUNITY)
Admission: EM | Admit: 2022-08-06 | Discharge: 2022-08-12 | DRG: 200 | Disposition: A | Discharge status: Hospice | Payer: Medicare Other | Attending: Student | Admitting: Student

## 2022-08-06 ENCOUNTER — Telehealth: Payer: Self-pay | Admitting: Internal Medicine

## 2022-08-06 ENCOUNTER — Other Ambulatory Visit: Payer: Self-pay

## 2022-08-06 ENCOUNTER — Emergency Department (HOSPITAL_COMMUNITY): Payer: Medicare Other

## 2022-08-06 ENCOUNTER — Telehealth: Payer: Self-pay | Admitting: Family Medicine

## 2022-08-06 ENCOUNTER — Encounter (HOSPITAL_COMMUNITY): Payer: Self-pay

## 2022-08-06 DIAGNOSIS — R627 Adult failure to thrive: Secondary | ICD-10-CM | POA: Diagnosis present

## 2022-08-06 DIAGNOSIS — E876 Hypokalemia: Secondary | ICD-10-CM | POA: Diagnosis present

## 2022-08-06 DIAGNOSIS — R52 Pain, unspecified: Secondary | ICD-10-CM | POA: Diagnosis not present

## 2022-08-06 DIAGNOSIS — R8281 Pyuria: Secondary | ICD-10-CM

## 2022-08-06 DIAGNOSIS — R1032 Left lower quadrant pain: Secondary | ICD-10-CM | POA: Diagnosis present

## 2022-08-06 DIAGNOSIS — E440 Moderate protein-calorie malnutrition: Secondary | ICD-10-CM | POA: Diagnosis not present

## 2022-08-06 DIAGNOSIS — R404 Transient alteration of awareness: Secondary | ICD-10-CM | POA: Diagnosis not present

## 2022-08-06 DIAGNOSIS — Z79899 Other long term (current) drug therapy: Secondary | ICD-10-CM

## 2022-08-06 DIAGNOSIS — S271XXA Traumatic hemothorax, initial encounter: Principal | ICD-10-CM | POA: Diagnosis present

## 2022-08-06 DIAGNOSIS — Z743 Need for continuous supervision: Secondary | ICD-10-CM | POA: Diagnosis not present

## 2022-08-06 DIAGNOSIS — R748 Abnormal levels of other serum enzymes: Secondary | ICD-10-CM | POA: Diagnosis present

## 2022-08-06 DIAGNOSIS — G9340 Encephalopathy, unspecified: Secondary | ICD-10-CM | POA: Diagnosis not present

## 2022-08-06 DIAGNOSIS — R269 Unspecified abnormalities of gait and mobility: Secondary | ICD-10-CM | POA: Diagnosis not present

## 2022-08-06 DIAGNOSIS — Z841 Family history of disorders of kidney and ureter: Secondary | ICD-10-CM

## 2022-08-06 DIAGNOSIS — Y92009 Unspecified place in unspecified non-institutional (private) residence as the place of occurrence of the external cause: Secondary | ICD-10-CM

## 2022-08-06 DIAGNOSIS — F32A Depression, unspecified: Secondary | ICD-10-CM | POA: Diagnosis not present

## 2022-08-06 DIAGNOSIS — Z66 Do not resuscitate: Secondary | ICD-10-CM | POA: Diagnosis not present

## 2022-08-06 DIAGNOSIS — F419 Anxiety disorder, unspecified: Secondary | ICD-10-CM | POA: Diagnosis not present

## 2022-08-06 DIAGNOSIS — R846 Abnormal cytological findings in specimens from respiratory organs and thorax: Secondary | ICD-10-CM | POA: Diagnosis not present

## 2022-08-06 DIAGNOSIS — F0280 Dementia in other diseases classified elsewhere without behavioral disturbance: Secondary | ICD-10-CM

## 2022-08-06 DIAGNOSIS — R451 Restlessness and agitation: Secondary | ICD-10-CM

## 2022-08-06 DIAGNOSIS — Z7189 Other specified counseling: Secondary | ICD-10-CM | POA: Diagnosis not present

## 2022-08-06 DIAGNOSIS — D509 Iron deficiency anemia, unspecified: Secondary | ICD-10-CM | POA: Diagnosis present

## 2022-08-06 DIAGNOSIS — S2243XA Multiple fractures of ribs, bilateral, initial encounter for closed fracture: Secondary | ICD-10-CM | POA: Diagnosis not present

## 2022-08-06 DIAGNOSIS — R531 Weakness: Secondary | ICD-10-CM | POA: Diagnosis not present

## 2022-08-06 DIAGNOSIS — R091 Pleurisy: Secondary | ICD-10-CM | POA: Diagnosis not present

## 2022-08-06 DIAGNOSIS — R410 Disorientation, unspecified: Secondary | ICD-10-CM | POA: Diagnosis not present

## 2022-08-06 DIAGNOSIS — S2243XD Multiple fractures of ribs, bilateral, subsequent encounter for fracture with routine healing: Secondary | ICD-10-CM | POA: Diagnosis not present

## 2022-08-06 DIAGNOSIS — R296 Repeated falls: Secondary | ICD-10-CM | POA: Diagnosis present

## 2022-08-06 DIAGNOSIS — F0283 Dementia in other diseases classified elsewhere, unspecified severity, with mood disturbance: Secondary | ICD-10-CM | POA: Diagnosis present

## 2022-08-06 DIAGNOSIS — Z781 Physical restraint status: Secondary | ICD-10-CM

## 2022-08-06 DIAGNOSIS — N390 Urinary tract infection, site not specified: Secondary | ICD-10-CM | POA: Diagnosis not present

## 2022-08-06 DIAGNOSIS — G3183 Dementia with Lewy bodies: Secondary | ICD-10-CM | POA: Diagnosis present

## 2022-08-06 DIAGNOSIS — E860 Dehydration: Secondary | ICD-10-CM | POA: Diagnosis present

## 2022-08-06 DIAGNOSIS — R4182 Altered mental status, unspecified: Secondary | ICD-10-CM | POA: Diagnosis not present

## 2022-08-06 DIAGNOSIS — Z885 Allergy status to narcotic agent status: Secondary | ICD-10-CM

## 2022-08-06 DIAGNOSIS — F02818 Dementia in other diseases classified elsewhere, unspecified severity, with other behavioral disturbance: Secondary | ICD-10-CM | POA: Diagnosis not present

## 2022-08-06 DIAGNOSIS — N200 Calculus of kidney: Secondary | ICD-10-CM | POA: Diagnosis present

## 2022-08-06 DIAGNOSIS — Z6821 Body mass index (BMI) 21.0-21.9, adult: Secondary | ICD-10-CM

## 2022-08-06 DIAGNOSIS — Z515 Encounter for palliative care: Secondary | ICD-10-CM | POA: Diagnosis not present

## 2022-08-06 DIAGNOSIS — J9811 Atelectasis: Secondary | ICD-10-CM | POA: Diagnosis not present

## 2022-08-06 DIAGNOSIS — J90 Pleural effusion, not elsewhere classified: Secondary | ICD-10-CM

## 2022-08-06 DIAGNOSIS — W19XXXA Unspecified fall, initial encounter: Secondary | ICD-10-CM | POA: Diagnosis present

## 2022-08-06 DIAGNOSIS — F0284 Dementia in other diseases classified elsewhere, unspecified severity, with anxiety: Secondary | ICD-10-CM | POA: Diagnosis present

## 2022-08-06 DIAGNOSIS — R109 Unspecified abdominal pain: Secondary | ICD-10-CM | POA: Diagnosis not present

## 2022-08-06 DIAGNOSIS — Z9181 History of falling: Secondary | ICD-10-CM

## 2022-08-06 DIAGNOSIS — Z823 Family history of stroke: Secondary | ICD-10-CM

## 2022-08-06 DIAGNOSIS — S2242XA Multiple fractures of ribs, left side, initial encounter for closed fracture: Principal | ICD-10-CM

## 2022-08-06 DIAGNOSIS — S2249XA Multiple fractures of ribs, unspecified side, initial encounter for closed fracture: Secondary | ICD-10-CM

## 2022-08-06 DIAGNOSIS — Z7401 Bed confinement status: Secondary | ICD-10-CM | POA: Diagnosis not present

## 2022-08-06 DIAGNOSIS — M810 Age-related osteoporosis without current pathological fracture: Secondary | ICD-10-CM | POA: Diagnosis present

## 2022-08-06 LAB — CBC WITH DIFFERENTIAL/PLATELET
Abs Immature Granulocytes: 0.03 10*3/uL (ref 0.00–0.07)
Basophils Absolute: 0.1 10*3/uL (ref 0.0–0.1)
Basophils Relative: 1 %
Eosinophils Absolute: 0.1 10*3/uL (ref 0.0–0.5)
Eosinophils Relative: 1 %
HCT: 40.1 % (ref 36.0–46.0)
Hemoglobin: 13 g/dL (ref 12.0–15.0)
Immature Granulocytes: 0 %
Lymphocytes Relative: 14 %
Lymphs Abs: 1.2 10*3/uL (ref 0.7–4.0)
MCH: 31.6 pg (ref 26.0–34.0)
MCHC: 32.4 g/dL (ref 30.0–36.0)
MCV: 97.3 fL (ref 80.0–100.0)
Monocytes Absolute: 0.8 10*3/uL (ref 0.1–1.0)
Monocytes Relative: 10 %
Neutro Abs: 6.6 10*3/uL (ref 1.7–7.7)
Neutrophils Relative %: 74 %
Platelets: 301 10*3/uL (ref 150–400)
RBC: 4.12 MIL/uL (ref 3.87–5.11)
RDW: 14.4 % (ref 11.5–15.5)
WBC: 8.8 10*3/uL (ref 4.0–10.5)
nRBC: 0 % (ref 0.0–0.2)

## 2022-08-06 LAB — COMPREHENSIVE METABOLIC PANEL
ALT: 119 U/L — ABNORMAL HIGH (ref 0–44)
AST: 99 U/L — ABNORMAL HIGH (ref 15–41)
Albumin: 3.9 g/dL (ref 3.5–5.0)
Alkaline Phosphatase: 65 U/L (ref 38–126)
Anion gap: 10 (ref 5–15)
BUN: 26 mg/dL — ABNORMAL HIGH (ref 8–23)
CO2: 24 mmol/L (ref 22–32)
Calcium: 9.2 mg/dL (ref 8.9–10.3)
Chloride: 107 mmol/L (ref 98–111)
Creatinine, Ser: 0.9 mg/dL (ref 0.44–1.00)
GFR, Estimated: >60 mL/min (ref >60)
Glucose, Bld: 115 mg/dL — ABNORMAL HIGH (ref 70–99)
Potassium: 3.9 mmol/L (ref 3.5–5.1)
Sodium: 141 mmol/L (ref 135–145)
Total Bilirubin: 1 mg/dL (ref 0.3–1.2)
Total Protein: 7.3 g/dL (ref 6.5–8.1)

## 2022-08-06 LAB — LIPASE, BLOOD: Lipase: 37 U/L (ref 11–51)

## 2022-08-06 MED ORDER — ESCITALOPRAM OXALATE 10 MG PO TABS
10.0000 mg | ORAL_TABLET | Freq: Every day | ORAL | Status: DC
  Administered 2022-08-07: 10 mg via ORAL
  Filled 2022-08-06: qty 1

## 2022-08-06 MED ORDER — IOHEXOL 300 MG/ML  SOLN
100.0000 mL | Freq: Once | INTRAMUSCULAR | Status: AC | PRN
  Administered 2022-08-06: 100 mL via INTRAVENOUS

## 2022-08-06 MED ORDER — ACETAMINOPHEN 325 MG PO TABS: 650.0000 mg | ORAL_TABLET | Freq: Four times a day (QID) | ORAL | Status: DC | PRN

## 2022-08-06 MED ORDER — SODIUM CHLORIDE 0.9 % IV BOLUS
1000.0000 mL | Freq: Once | INTRAVENOUS | Status: AC
  Administered 2022-08-07: 1000 mL via INTRAVENOUS

## 2022-08-06 MED ORDER — MEMANTINE HCL 10 MG PO TABS
10.0000 mg | ORAL_TABLET | Freq: Two times a day (BID) | ORAL | Status: DC
  Administered 2022-08-07 (×2): 10 mg via ORAL
  Filled 2022-08-06 (×3): qty 2

## 2022-08-06 MED ORDER — DONEPEZIL HCL 10 MG PO TABS
10.0000 mg | ORAL_TABLET | Freq: Every day | ORAL | Status: DC
  Administered 2022-08-07: 10 mg via ORAL
  Filled 2022-08-06: qty 2

## 2022-08-06 NOTE — ED Provider Notes (Signed)
[facility]-EMERGENCY DEPT Provider Note   CSN: [id number] Arrival date & time: 08/06/22  1306     History  Chief Complaint  Patient presents with   Weakness   Abdominal Pain    Julia Palmer is a 81 y.o. female with history of Lewy body dementia presenting from home with generalized weakness.  Per medical record review and the history provided by her husband, the patient had several falls over the past several days, had an x-ray of her chest on 08/03/22 which showed displaced rib fractures of the left sixth and seventh and possibly eighth rib.  She was prescribed tramadol by her primary care physician.  Her husband states the patient has had a continuous decline in her health since then.  She has been too weak to get up on her own, which she typically does.  He is needing to left around the house that he is newly caretaker.  The PCP recommended that he stop the tramadol, this may be causing confusion.  The patient has diminished her oral intake, fluid intake.  She is still having regular bowel movements.  She was complaining of left lower quadrant abdominal pain to her husband.  On 07/05/22, the patient is CT of the abdomen showed focal thickening of the distal sigmoid colon without surrounding abscess or free air, querying focal diverticulitis versus colitis versus neoplasm.  At baseline the patient is able to converse a small amount with her husband, but mostly mumbles nonsensically.  The patient is a level 5 caveat for dementia  HPI     Home Medications Prior to Admission medications   Medication Sig Start Date End Date Taking? Authorizing Provider  cholecalciferol (VITAMIN D3) 25 MCG (1000 UNIT) tablet Take 1,000 Units by mouth daily.    [provider]  donepezil (ARICEPT) 10 MG tablet TAKE 1 TABLET BY MOUTH DAILY 11/15/21   Van Clines, MD  escitalopram (LEXAPRO) 10 MG tablet TAKE 1 TABLET(10 MG) BY MOUTH AT BEDTIME 05/31/22   Van Clines,  MD  memantine (NAMENDA) 10 MG tablet TAKE 1 TABLET BY MOUTH IN THE MORNING AND IN THE EVENING 11/15/21   Van Clines, MD  Multiple Vitamins-Minerals (ICAPS AREDS 2 PO) Take by mouth.    [provider]  potassium chloride (KLOR-CON) 10 MEQ tablet Take 10 mEq by mouth daily. 07/04/22   [provider]  traMADol (ULTRAM) 50 MG tablet Take 1 tablet (50 mg total) by mouth every 8 (eight) hours as needed for up to 5 days. 08/03/22 08/08/22  Shelva Majestic, MD      Allergies    Codeine    Review of Systems   Review of Systems  Physical Exam Updated Vital Signs BP (!) 88/54   Pulse 81   Temp 97.8 F (36.6 C) (Oral)   Resp 19   Ht 5\' 4"  (1.626 m)   Wt 56.2 kg   SpO2 96%   BMI 21.28 kg/m  Physical Exam Constitutional:      General: She is not in acute distress.    Comments: Dementia, nonverbal with me  HENT:     Head: Normocephalic and atraumatic.  Eyes:     Conjunctiva/sclera: Conjunctivae normal.     Pupils: Pupils are equal, round, and reactive to light.  Cardiovascular:     Rate and Rhythm: Normal rate and regular rhythm.  Pulmonary:     Effort: Pulmonary effort is normal. No respiratory distress.  Abdominal:  General: There is no distension.     Tenderness: There is abdominal tenderness in the left lower quadrant.  Skin:    General: Skin is warm and dry.  Neurological:     General: No focal deficit present.     Mental Status: She is alert. Mental status is at baseline.     ED Results / Procedures / Treatments   Labs (all labs ordered are listed, but only abnormal results are displayed) Labs Reviewed  COMPREHENSIVE METABOLIC PANEL - Abnormal; Notable for the following components:      Result Value   Glucose, Bld 115 (*)    BUN 26 (*)    AST 99 (*)    ALT 119 (*)    All other components within normal limits  CBC WITH DIFFERENTIAL/PLATELET  LIPASE, BLOOD  URINALYSIS, ROUTINE W REFLEX MICROSCOPIC    EKG None  Radiology CT ABDOMEN  PELVIS W CONTRAST  Result Date: 08/06/2022 CLINICAL DATA:  Abdominal pain, dementia, history of colitis, multiple falls EXAM: CT ABDOMEN AND PELVIS WITH CONTRAST TECHNIQUE: Multidetector CT imaging of the abdomen and pelvis was performed using the standard protocol following bolus administration of intravenous contrast. RADIATION DOSE REDUCTION: This exam was performed according to the departmental dose-optimization program which includes automated exposure control, adjustment of the mA and/or kV according to patient size and/or use of iterative reconstruction technique. CONTRAST:  116mL OMNIPAQUE IOHEXOL 300 MG/ML  SOLN COMPARISON:  07/05/2022, 08/06/2022 FINDINGS: Lower chest: Dense left lower lobe consolidation consistent with atelectasis. Moderate left pleural effusion. Hypoventilatory changes at the right lung base. No pneumothorax. Hepatobiliary: Evaluation limited due to patient motion. No acute liver abnormality. The gallbladder is grossly normal. Pancreas: Unremarkable. No pancreatic ductal dilatation or surrounding inflammatory changes. Spleen: Normal in size without focal abnormality. Adrenals/Urinary Tract: Stable right-sided staghorn calculi. No obstructive uropathy within either kidney. No evidence of acute renal injury. The adrenals and bladder are grossly unremarkable. Stomach/Bowel: No bowel obstruction or ileus. No bowel wall thickening or inflammatory change. Vascular/Lymphatic: No significant vascular findings are present. No enlarged abdominal or pelvic lymph nodes. Reproductive: Uterus and bilateral adnexa are unremarkable. Other: No free fluid or free intraperitoneal gas. No abdominal wall hernia. Musculoskeletal: There are acute left lateral sixth through ninth rib fractures. Subacute healing left posterior ninth through twelfth rib fractures are also seen, with moderate callus formation. No other acute bony abnormalities. Chronic L1 compression deformity with evidence of prior vertebral  augmentation. Reconstructed images demonstrate no additional findings. IMPRESSION: 1. Acute displaced left lateral sixth through ninth rib fractures. 2. Moderate left pleural effusion, with left lower lobe atelectasis. 3. Subacute healing left posterior ninth through twelfth rib fractures. 4. Stable staghorn right renal calculi. No obstructive uropathy within either kidney. Electronically Signed   By: Randa Ngo M.D.   On: 08/06/2022 22:43   DG Chest 2 View  Result Date: 08/06/2022 CLINICAL DATA:  Rib fractures.  Evaluate for pneumothorax. EXAM: CHEST - 2 VIEW COMPARISON:  Chest radiograph 08/03/2022 FINDINGS: Patient is rotated to the left. Stable cardiac and mediastinal contours. Aortic tortuosity. Interval development of moderate left pleural effusion with underlying consolidation within the left lower hemithorax. No definite pneumothorax. Kyphoplasty material within the thoracolumbar spine. Redemonstrated multiple left lower lateral displaced rib fractures. IMPRESSION: 1. Interval development of moderate left pleural effusion with underlying consolidation, likely atelectasis. 2. Redemonstrated multiple left lower lateral rib fractures. Electronically Signed   By: Lovey Newcomer M.D.   On: 08/06/2022 16:24    Procedures Procedures  Medications Ordered in ED Medications  sodium chloride 0.9 % bolus 1,000 mL (has no administration in time range)  donepezil (ARICEPT) tablet 10 mg (has no administration in time range)  escitalopram (LEXAPRO) tablet 10 mg (has no administration in time range)  memantine (NAMENDA) tablet 10 mg (has no administration in time range)  acetaminophen (TYLENOL) tablet 650 mg (has no administration in time range)  iohexol (OMNIPAQUE) 300 MG/ML solution 100 mL (100 mLs Intravenous Contrast Given 08/06/22 2223)    ED Course/ Medical Decision Making/ A&P                           Medical Decision Making Amount and/or Complexity of Data Reviewed Labs:  ordered. Radiology: ordered.  Risk OTC drugs. Prescription drug management. Decision regarding hospitalization.   This patient presents to the ED with concern for failure to thrive, poor fluid intake, frequent falls, abdominal pain. This involves an extensive number of treatment options, and is a complaint that carries with it a high risk of complications and morbidity.  The differential diagnosis includes pancreatitis versus colitis versus UTI versus other  Co-morbidities that complicate the patient evaluation: Dementia, history of colitis  Additional history obtained from patient's husband at the bedside  External records from outside source obtained and reviewed including CT imaging 1 month ago showing colitis  I ordered and personally interpreted labs.  The pertinent results include: Labs unremarkable, within normal limits.  Mild chronic nonspecific elevation of liver enzymes.  UA pending at the time of admission.  I ordered imaging studies including X-ray of the chest, CT of the abdomen I independently visualized and interpreted imaging which showed multiple left-sided rib fractures, left moderate pleural effusion, no significant intra-abdominal abnormalities I agree with the radiologist interpretation  The patient was maintained on a cardiac monitor.  I personally viewed and interpreted the cardiac monitored which showed an underlying rhythm of: NSR  I ordered medication including IV fluid bolus for hydration  I have reviewed the patients home medicines and have made adjustments as needed  Test Considered: Low suspicion for stroke with this clinical setting.  After the interventions noted above, I reevaluated the patient and found that they have: stayed the same   Dispostion:  After consideration of the diagnostic results and the patients response to treatment, I feel that the patent would benefit from medical admission for pain control, possible thoracentesis, and  consideration of SNF placement.  Patient is in a rapid decline of her independent function since her injuries and falls.  She is high risk for continued falls and injuries due to her significant dementia.         Final Clinical Impression(s) / ED Diagnoses Final diagnoses:  Closed fracture of multiple ribs of left side, initial encounter  Pleural effusion, left  Weakness  Lewy body dementia, unspecified dementia severity, unspecified whether behavioral, psychotic, or mood disturbance or anxiety [facility])    Rx / DC Orders ED Discharge Orders     None         Terald Sleeper, MD 08/07/22 1358

## 2022-08-06 NOTE — Telephone Encounter (Signed)
GI called with STAT results    IMPRESSION: Displaced fractures of the left sixth and seventh and possibly eighth lateral ribs. Atelectasis at the left lung base with a small left pleural effusion is identified. The frontal film is rotated limiting evaluation for pleural line in the apex to assess pneumothorax. Recommend further evaluation with a PA chest x-ray/none rotated.   These results will be called to the ordering clinician or representative by the Radiologist Assistant, and communication documented in the PACS or [employer].

## 2022-08-06 NOTE — ED Provider Triage Note (Signed)
Emergency Medicine Provider Triage Evaluation Note  Julia Palmer , a 81 y.o. female  was evaluated in triage.  Pt complains of abdominal pain.  Started several weeks ago.  Located in the left lower quadrant.  It is nonradiating.  Pain is constant.  Husband states that she has been evaluated multiple times for this ongoing pain.  Denies fever, bloody stools, hematuria.  Also was noted to have several rib fractures on x-ray today.  Chart review, chest x-ray was limited and could not evaluate for pneumothorax.  Per husband, patient is not short of breath or endorsing chest pain.  Review of Systems  Positive: See above Negative: See above  Physical Exam  BP 113/77 (BP Location: Right Arm)   Pulse (!) 101   Temp 98.9 F (37.2 C) (Axillary) Comment: Pt not able to follow command  Resp 12   Ht 5\' 4"  (1.626 m)   Wt 56.2 kg   SpO2 95%   BMI 21.28 kg/m  Gen:   Awake, no distress   Resp:  Normal effort  MSK:   Moves extremities without difficulty  Other:    Medical Decision Making  Medically screening exam initiated at 3:52 PM.  Appropriate orders placed.  Julia Palmer was informed that the remainder of the evaluation will be completed by another provider, this initial triage assessment does not replace that evaluation, and the importance of remaining in the ED until their evaluation is complete.  Work-up initiated   Harriet Pho, PA-C 08/17/22 [phone]

## 2022-08-06 NOTE — Telephone Encounter (Signed)
Noted see other phone note- going to recommend ED disposition

## 2022-08-06 NOTE — Telephone Encounter (Signed)
Urgent referral placed in [place] for LLQ pain  and colitis.   Spoke with patient's husband and offered next available with extender and he declined "she cannot wait that long."  Please advise scheduling?

## 2022-08-06 NOTE — ED Triage Notes (Addendum)
Patient is non ambulatory, but was walking 4 days ago with help, today she is unable to ambulate with assistance. Patient has had falls x 4 in the past few days. Noted on x-rays that were done 3 days ago that ht patient has multiple rib fractures. Dr. Yong Channel told husband to stop Tramadol.   Patient has dementia, but husband states she has AMS more so today than usual.

## 2022-08-06 NOTE — Telephone Encounter (Signed)
Office visit with me this Wednesday, October 25 at 11 AM

## 2022-08-06 NOTE — Telephone Encounter (Signed)
Stop the tramadol immediately  She has multiple rib fractures  With multiple falls on Chest x-ray- need to rule out pneumothorax with different x-ray plus I think she may need close observation to find ideal pain control regimen with rib fractures  With acute decline needs updated bloodwork as well (though likely related to tramadol)

## 2022-08-06 NOTE — Telephone Encounter (Signed)
Pt's spouse is concerned with pt. Pt is acting completely different since Friday. She has fallen 4 times, refuses to take meds, feels like she cannot walk, and is staying up all night. She is not getting any better. Wondering if this is a reaction to new meds.   He states Dr Henrene Pastor cannot see her until late Dec and does not want to see a Therapist, sports.   He is wondering if she should be placed in the hospital for a few days.   Would like a call back please

## 2022-08-06 NOTE — Telephone Encounter (Signed)
Urgent referral for LLQ abd pain and colitis. 1st available app appt is 08/21/22. Please advise regarding scheduling.

## 2022-08-06 NOTE — Telephone Encounter (Signed)
Webb Laws D  You 4 minutes ago (11:51 AM)   CH FYI: I spoke with patient's husband regarding appt for 08/06/22.  He stated he spoke with Dr. Ansel Bong office and was advised to take her to ED today.  He insisted on keeping appt for 08/06/22 and will call to cancel if she is unable to keep appt.

## 2022-08-06 NOTE — Telephone Encounter (Signed)
I called pt and informed them to go to the ER, recommended [facility]; pt stated that's where he will go. Informed to stop tramadol immediately per providers request.

## 2022-08-06 NOTE — Telephone Encounter (Signed)
See below and please notify pt of appt.

## 2022-08-07 ENCOUNTER — Inpatient Hospital Stay (HOSPITAL_COMMUNITY): Payer: Medicare Other

## 2022-08-07 ENCOUNTER — Encounter (HOSPITAL_COMMUNITY): Payer: Self-pay | Admitting: Internal Medicine

## 2022-08-07 DIAGNOSIS — Z7189 Other specified counseling: Secondary | ICD-10-CM | POA: Diagnosis not present

## 2022-08-07 DIAGNOSIS — F02818 Dementia in other diseases classified elsewhere, unspecified severity, with other behavioral disturbance: Secondary | ICD-10-CM

## 2022-08-07 DIAGNOSIS — G3183 Dementia with Lewy bodies: Secondary | ICD-10-CM

## 2022-08-07 DIAGNOSIS — E860 Dehydration: Secondary | ICD-10-CM

## 2022-08-07 DIAGNOSIS — R748 Abnormal levels of other serum enzymes: Secondary | ICD-10-CM

## 2022-08-07 DIAGNOSIS — F32A Depression, unspecified: Secondary | ICD-10-CM

## 2022-08-07 DIAGNOSIS — S2243XD Multiple fractures of ribs, bilateral, subsequent encounter for fracture with routine healing: Secondary | ICD-10-CM

## 2022-08-07 DIAGNOSIS — Z66 Do not resuscitate: Secondary | ICD-10-CM

## 2022-08-07 DIAGNOSIS — R296 Repeated falls: Secondary | ICD-10-CM

## 2022-08-07 DIAGNOSIS — S2249XA Multiple fractures of ribs, unspecified side, initial encounter for closed fracture: Secondary | ICD-10-CM

## 2022-08-07 DIAGNOSIS — R531 Weakness: Secondary | ICD-10-CM | POA: Diagnosis not present

## 2022-08-07 DIAGNOSIS — F419 Anxiety disorder, unspecified: Secondary | ICD-10-CM

## 2022-08-07 DIAGNOSIS — R8281 Pyuria: Secondary | ICD-10-CM

## 2022-08-07 LAB — BODY FLUID CELL COUNT WITH DIFFERENTIAL
Eos, Fluid: 1 %
Lymphs, Fluid: 15 %
Monocyte-Macrophage-Serous Fluid: 71 % (ref 50–90)
Neutrophil Count, Fluid: 13 % (ref 0–25)
Total Nucleated Cell Count, Fluid: 2442 cu mm — ABNORMAL HIGH (ref 0–1000)

## 2022-08-07 LAB — URINALYSIS, ROUTINE W REFLEX MICROSCOPIC
Bilirubin Urine: NEGATIVE
Glucose, UA: NEGATIVE mg/dL
Ketones, ur: 20 mg/dL — AB
Nitrite: NEGATIVE
Protein, ur: 30 mg/dL — AB
RBC / HPF: >50 RBC/hpf — ABNORMAL HIGH (ref 0–5)
Specific Gravity, Urine: >1.046 — ABNORMAL HIGH (ref 1.005–1.030)
pH: 5 (ref 5.0–8.0)

## 2022-08-07 LAB — COMPREHENSIVE METABOLIC PANEL
ALT: 99 U/L — ABNORMAL HIGH (ref 0–44)
AST: 83 U/L — ABNORMAL HIGH (ref 15–41)
Albumin: 3.4 g/dL — ABNORMAL LOW (ref 3.5–5.0)
Alkaline Phosphatase: 58 U/L (ref 38–126)
Anion gap: 8 (ref 5–15)
BUN: 23 mg/dL (ref 8–23)
CO2: 22 mmol/L (ref 22–32)
Calcium: 8.5 mg/dL — ABNORMAL LOW (ref 8.9–10.3)
Chloride: 109 mmol/L (ref 98–111)
Creatinine, Ser: 0.78 mg/dL (ref 0.44–1.00)
GFR, Estimated: >60 mL/min (ref >60)
Glucose, Bld: 85 mg/dL (ref 70–99)
Potassium: 3.4 mmol/L — ABNORMAL LOW (ref 3.5–5.1)
Sodium: 139 mmol/L (ref 135–145)
Total Bilirubin: 1.1 mg/dL (ref 0.3–1.2)
Total Protein: 6.5 g/dL (ref 6.5–8.1)

## 2022-08-07 LAB — RETICULOCYTES
Immature Retic Fract: 5.4 % (ref 2.3–15.9)
RBC.: 4.01 MIL/uL (ref 3.87–5.11)
Retic Count, Absolute: 51.7 10*3/uL (ref 19.0–186.0)
Retic Ct Pct: 1.3 % (ref 0.4–3.1)

## 2022-08-07 LAB — IRON AND TIBC
Iron: 41 ug/dL (ref 28–170)
Saturation Ratios: 14 % (ref 10.4–31.8)
TIBC: 288 ug/dL (ref 250–450)
UIBC: 247 ug/dL

## 2022-08-07 LAB — CBC
HCT: 36 % (ref 36.0–46.0)
Hemoglobin: 11.7 g/dL — ABNORMAL LOW (ref 12.0–15.0)
MCH: 31.8 pg (ref 26.0–34.0)
MCHC: 32.5 g/dL (ref 30.0–36.0)
MCV: 97.8 fL (ref 80.0–100.0)
Platelets: 284 10*3/uL (ref 150–400)
RBC: 3.68 MIL/uL — ABNORMAL LOW (ref 3.87–5.11)
RDW: 14.2 % (ref 11.5–15.5)
WBC: 8.7 10*3/uL (ref 4.0–10.5)
nRBC: 0 % (ref 0.0–0.2)

## 2022-08-07 LAB — FERRITIN: Ferritin: 137 ng/mL (ref 11–307)

## 2022-08-07 LAB — MAGNESIUM: Magnesium: 2.2 mg/dL (ref 1.7–2.4)

## 2022-08-07 LAB — ALBUMIN, PLEURAL OR PERITONEAL FLUID: Albumin, Fluid: 2.6 g/dL

## 2022-08-07 LAB — PHOSPHORUS: Phosphorus: 2.8 mg/dL (ref 2.5–4.6)

## 2022-08-07 LAB — VITAMIN B12: Vitamin B-12: 2155 pg/mL — ABNORMAL HIGH (ref 180–914)

## 2022-08-07 LAB — LACTATE DEHYDROGENASE, PLEURAL OR PERITONEAL FLUID: LD, Fluid: 332 U/L — ABNORMAL HIGH (ref 3–23)

## 2022-08-07 LAB — AMMONIA: Ammonia: 14 umol/L (ref 9–35)

## 2022-08-07 LAB — CK: Total CK: 1177 U/L — ABNORMAL HIGH (ref 38–234)

## 2022-08-07 LAB — FOLATE: Folate: 28.2 ng/mL (ref >5.9)

## 2022-08-07 LAB — HIV ANTIBODY (ROUTINE TESTING W REFLEX): HIV Screen 4th Generation wRfx: NONREACTIVE

## 2022-08-07 LAB — TSH: TSH: 1.031 u[IU]/mL (ref 0.350–4.500)

## 2022-08-07 MED ORDER — MELATONIN 5 MG PO TABS: 5.0000 mg | ORAL_TABLET | Freq: Every evening | ORAL | Status: DC | PRN

## 2022-08-07 MED ORDER — SODIUM CHLORIDE 0.9 % IV SOLN
2.0000 g | INTRAVENOUS | Status: DC
  Administered 2022-08-07 – 2022-08-08 (×2): 2 g via INTRAVENOUS
  Filled 2022-08-07 (×2): qty 20

## 2022-08-07 MED ORDER — LACTATED RINGERS IV SOLN: INTRAVENOUS | Status: DC

## 2022-08-07 MED ORDER — PROCHLORPERAZINE EDISYLATE 10 MG/2ML IJ SOLN: 5.0000 mg | Freq: Four times a day (QID) | INTRAMUSCULAR | Status: DC | PRN

## 2022-08-07 MED ORDER — POLYETHYLENE GLYCOL 3350 17 G PO PACK: 17.0000 g | PACK | Freq: Every day | ORAL | Status: DC | PRN

## 2022-08-07 MED ORDER — THIAMINE HCL 100 MG/ML IJ SOLN
100.0000 mg | Freq: Three times a day (TID) | INTRAMUSCULAR | Status: DC
  Administered 2022-08-07 – 2022-08-08 (×2): 100 mg via INTRAVENOUS
  Filled 2022-08-07 (×2): qty 2

## 2022-08-07 MED ORDER — ENOXAPARIN SODIUM 40 MG/0.4ML IJ SOSY
40.0000 mg | PREFILLED_SYRINGE | INTRAMUSCULAR | Status: DC
  Administered 2022-08-07: 40 mg via SUBCUTANEOUS
  Filled 2022-08-07: qty 0.4

## 2022-08-07 MED ORDER — ACETAMINOPHEN 325 MG PO TABS
650.0000 mg | ORAL_TABLET | Freq: Four times a day (QID) | ORAL | Status: DC
  Administered 2022-08-07: 650 mg via ORAL
  Filled 2022-08-07: qty 2

## 2022-08-07 MED ORDER — ENOXAPARIN SODIUM 40 MG/0.4ML IJ SOSY: 40.0000 mg | PREFILLED_SYRINGE | INTRAMUSCULAR | Status: DC

## 2022-08-07 MED ORDER — LIDOCAINE HCL 1 % IJ SOLN
INTRAMUSCULAR | Status: AC
  Administered 2022-08-07: 15 mL
  Filled 2022-08-07: qty 20

## 2022-08-07 MED ORDER — FENTANYL CITRATE PF 50 MCG/ML IJ SOSY
12.5000 ug | PREFILLED_SYRINGE | Freq: Once | INTRAMUSCULAR | Status: AC
  Administered 2022-08-07: 12.5 ug via INTRAVENOUS
  Filled 2022-08-07: qty 1

## 2022-08-07 MED ORDER — ACETAMINOPHEN 650 MG RE SUPP: 650.0000 mg | Freq: Four times a day (QID) | RECTAL | Status: DC | PRN

## 2022-08-07 MED ORDER — VITAMIN D 25 MCG (1000 UNIT) PO TABS
1000.0000 [IU] | ORAL_TABLET | Freq: Every day | ORAL | Status: DC
  Administered 2022-08-07: 1000 [IU] via ORAL
  Filled 2022-08-07: qty 1

## 2022-08-07 MED ORDER — DEXTROSE IN LACTATED RINGERS 5 % IV SOLN: INTRAVENOUS | Status: DC

## 2022-08-07 NOTE — Evaluation (Signed)
Occupational Therapy Evaluation Patient Details Name: Julia Palmer MRN: [id number] DOB: 1941-05-17 Today's Date: 08/07/2022   History of Present Illness [name] is a 81 y.o. female with medical history significant for Lewy body dementia, chronic anxiety/depression, who presented from home with generalized weakness and recurrent falls for the past few days. Imaging revealed 4 left rib fractures with moderate left pleural effusion.   Clinical Impression   Patient is currently requiring Total assistance with all ADLs including feeding at bed level.  Current level of function is below patient's typical baseline where spouse reports pt can ambulate limited distances with hand held assistance and can sometimes feed self and participate with some simple grooming.    During this evaluation, patient was limited by Lewy Body Dementia with associated cognitive deficits, profound generalized weakness with stiffness and pain with movement, and impaired activity tolerance, all of which has the potential to impact patient's and caregiver's safety and independence during functional mobility, as well as performance for ADLs.  Patient lives in a 2 story home with a chair lift with her husband, who is able to provide 24/7 supervision and assistance, but has no other help, is elderly and expressed concern that he cannot safely care for pt with her current impairments.  Patient demonstrates fair rehab potential, and should benefit from continued skilled occupational therapy services while in acute care to maximize safety, independence and quality of life at home.  Continued occupational therapy services in a SNF setting prior to return home is recommended.  ?       Recommendations for follow up therapy are one component of a multi-disciplinary discharge planning process, led by the attending physician.  Recommendations may be updated based on patient status, additional functional criteria and insurance  authorization.   Follow Up Recommendations  Skilled nursing-short term rehab (<3 hours/day)    Assistance Recommended at Discharge Frequent or constant Supervision/Assistance  Patient can return home with the following Two people to help with walking and/or transfers;A lot of help with walking and/or transfers;A lot of help with bathing/dressing/bathroom;Two people to help with bathing/dressing/bathroom;Direct supervision/assist for medications management;Direct supervision/assist for financial management;Assist for transportation;Assistance with cooking/housework;Assistance with feeding;Help with stairs or ramp for entrance    Functional Status Assessment  Patient has had a recent decline in their functional status and demonstrates the ability to make significant improvements in function in a reasonable and predictable amount of time.  Equipment Recommendations   (Will defer to post-acute recommendations)    Recommendations for Other Services       Precautions / Restrictions Precautions Precautions: Fall Restrictions Weight Bearing Restrictions: No      Mobility Bed Mobility                    Transfers                          Balance Overall balance assessment: History of Falls                                         ADL either performed or assessed with clinical judgement   ADL Overall ADL's : Needs assistance/impaired Eating/Feeding: Cueing for sequencing;Cueing for compensatory techinques;Bed level;Cueing for safety Eating/Feeding Details (indicate cue type and reason): Pt repositioned in bed for feeding. Pt prompted for feeding after RN provided oral care. Pt with poor  attention to task, and required step by step cues for visual attention. Bacon placed in pt's dominant hand with cues/promts, but pt woud not initiate bite and resisted hand over hand guidance with verbal cues. Pt asked for ice water but would not hold and stated, "You  have to hold it for me." Pt drank very quickly and water removed from mouth due to rapid swallowing. Pt coughed for ~1 min after and RN was notified of this. Grooming: Total assistance;Bed level;Oral care Grooming Details (indicate cue type and reason): To swab mouth. Upper Body Bathing: Bed level;Total assistance   Lower Body Bathing: Total assistance;Bed level   Upper Body Dressing : Bed level;Total assistance   Lower Body Dressing: Total assistance;Bed level     Toilet Transfer Details (indicate cue type and reason): Not attempted for safety. Would recommend 2nd person before mobilizing. Toileting- Clothing Manipulation and Hygiene: Total assistance;Bed level       Functional mobility during ADLs: Total assistance;+2 for physical assistance (to adjust position and scoot in bed)       Vision   Additional Comments: Unsure at this time. Pt gives inconsistent visual attention to people and tasks such as food tray.     Perception     Praxis      Pertinent Vitals/Pain Pain Assessment Pain Assessment: Faces Faces Pain Scale: Hurts even more Pain Location: Bed mobility with posterior scoot to prepare for feeding. Pain Descriptors / Indicators: Grimacing, Guarding, Moaning Pain Intervention(s): Limited activity within patient's tolerance, Monitored during session, Repositioned     Hand Dominance Right   Extremity/Trunk Assessment Upper Extremity Assessment Upper Extremity Assessment: RUE deficits/detail;LUE deficits/detail RUE Deficits / Details: AAROM shoulder with stiffness and downward resistance felt at ~90 degrees, although pt denuied pain. PROM to elbow and wrist, grip very weak RUE Coordination: decreased fine motor;decreased gross motor LUE Deficits / Details: AAOM to shoulder WFL, elbow AAROM stiff (guarding from IV?), grip very week. LUE Coordination: decreased fine motor   Lower Extremity Assessment Lower Extremity Assessment: Defer to PT evaluation        Communication Communication Communication: Expressive difficulties (Very difficult to decifer ~60-70% of pt's verbalizations. Very low muttering.)   Cognition Arousal/Alertness: Awake/alert Behavior During Therapy: Flat affect Overall Cognitive Status: History of cognitive impairments - at baseline Area of Impairment: Orientation, Attention, Following commands                 Orientation Level: Disoriented to, Situation Current Attention Level: Sustained   Following Commands: Follows one step commands inconsistently       General Comments: Lewy Body Dementia, advanced.     General Comments       Exercises     Shoulder Instructions      Home Living Family/patient expects to be discharged to:: Private residence Living Arrangements: Spouse/significant other Available Help at Discharge: Family;Neighbor;Available 24 hours/day Type of Home: House       Home Layout: Two level Alternate Level Stairs-Number of Steps: Chair lift to 2nd floor where bedroom and bathoom are.                 Additional Comments: Chair lift. Unable to complete all bathroom/DME questions as spouse leaving room with pt for imaging.      Prior Functioning/Environment Prior Level of Function : History of Falls (last six months);Needs assist  Cognitive Assist : ADLs (cognitive);Mobility (cognitive) Mobility (Cognitive): Step by step cues ADLs (Cognitive): Step by step cues Physical Assist : ADLs (physical);Mobility (physical) Mobility (physical):  Gait;Transfers;Bed mobility ADLs (physical): Feeding;Grooming;Bathing;Dressing;Toileting;IADLs Mobility Comments: Pt's spouse denies use of walker or wheelchair. States that he provides bilateral hand held assist with limited ambulation in home, mostly to/from bathroom. ADLs Comments: Pt reports that pt will sometimes feed self, can use utensils occasionally but better with finger foods.  P has been working on oral hygiene but had recent oral  surgery and needs assistance. Spouse assists with bathing, dressing UB and LB, feeding as needed, going into bathroom and hygiene as pt voids in a diaper brief.        OT Problem List: Decreased strength;Decreased coordination;Pain;Decreased range of motion;Decreased cognition;Decreased activity tolerance;Decreased safety awareness;Decreased knowledge of use of DME or AE;Impaired balance (sitting and/or standing);Impaired vision/perception;Decreased knowledge of precautions;Impaired UE functional use      OT Treatment/Interventions: Self-care/ADL training;Therapeutic exercise;Therapeutic activities;Neuromuscular education;DME and/or AE instruction;Patient/family education;Visual/perceptual remediation/compensation;Balance training;Manual therapy    OT Goals(Current goals can be found in the care plan section) Acute Rehab OT Goals Patient Stated Goal: Per spouse: For pt to get stronger so he can safely care for her at home. OT Goal Formulation: With family Time For Goal Achievement: 08/21/22 Potential to Achieve Goals: Fair ADL Goals Pt Will Perform Eating: sitting;with mod assist;with caregiver independent in assisting Pt Will Perform Grooming: sitting;with min assist Pt Will Transfer to Toilet: ambulating;with min assist (Hand held assist.) Additional ADL Goal #1: Pt will tolerate EOB sitting +10 min with fair sitting balance in order to assist caregiver with seated ADLs while maximizing safety.  OT Frequency: Min 2X/week    Co-evaluation              AM-PAC OT "6 Clicks" Daily Activity     Outcome Measure Help from another person eating meals?: Total Help from another person taking care of personal grooming?: Total Help from another person toileting, which includes using toliet, bedpan, or urinal?: Total Help from another person bathing (including washing, rinsing, drying)?: Total Help from another person to put on and taking off regular upper body clothing?: Total Help from  another person to put on and taking off regular lower body clothing?: Total 6 Click Score: 6   End of Session Nurse Communication: Other (comment) (Unable to feed self at this time)  Activity Tolerance: Patient limited by pain Patient left: in bed;with nursing/sitter in room;with family/visitor present  OT Visit Diagnosis: Feeding difficulties (R63.3);Pain;Muscle weakness (generalized) (M62.81);History of falling (Z91.81);Repeated falls (R29.6) Pain - part of body:  (diffuse with movement)                Time: 0937-1005 (+8 min later in AM when spouse arrived to gather home/PLOF info) OT Time Calculation (min): 28 min Charges:  OT General Charges $OT Visit: 1 Visit OT Evaluation $OT Eval Low Complexity: 1 Low OT Treatments $Self Care/Home Management : 8-22 mins  Anderson Malta, OT Acute Rehab Services Office: [phone] 08/07/2022  Julien Girt 08/07/2022, 11:21 AM

## 2022-08-07 NOTE — Progress Notes (Signed)
PROGRESS NOTE  Julia Palmer:[id number] DOB: 1941-03-05   PCP: Shelva Majestic, MD  Patient is from: Home.  Lives with husband.  DOA: 08/06/2022 LOS: 1  Chief complaints Chief Complaint  Patient presents with   Weakness   Abdominal Pain     Brief Narrative / Interim history: 81 year old F with PMH of Lewy body dementia, anxiety, depression, bilateral rib fractures and recurrent falls presenting with generalized weakness and recurrent fall, and admitted for dehydration, generalized weakness, elevated liver enzymes and pleural effusion.  She had pyuria on UA and started on IV ceftriaxone.   Subjective: Seen and examined earlier this morning.  No major events overnight of this morning.  Patient is awake but not a great historian.  She is oriented to self and place.  Difficult to understand her speech.  She continuously mumbles.  Does not appear to be in distress.  Objective: Vitals:   08/07/22 0900 08/07/22 1030 08/07/22 1125 08/07/22 1132  BP: (!) 148/93 124/77 (!) 158/81 (!) 143/93  Pulse: (!) 109 90    Resp: 20 20    Temp:  98 F (36.7 C)    TempSrc:      SpO2: 94% 96%    Weight:      Height:        Examination:  GENERAL: Appears frail.  No distress. HEENT: MMM.  Vision and hearing grossly intact.  NECK: Supple.  No apparent JVD.  RESP:  No IWOB.  Fair aeration bilaterally. CVS:  RRR. Heart sounds normal.  ABD/GI/GU: BS+. Abd soft, NTND.  MSK/EXT:  Moves extremities.  Some contractures in lower extremities. SKIN: no apparent skin lesion or wound NEURO: Awake, alert and oriented appropriately.  She mumbles.  Difficult to understand speech.  No apparent focal neuro deficit.  Tremors noted. PSYCH: Calm. Normal affect.   Procedures:  None  Microbiology summarized: Pleural fluid culture pending Urine culture pending  Assessment and plan: Principal Problem:   Generalized weakness Active Problems:   Lewy body dementia with behavioral disturbance (HCC)    Anxiety and depression   Dehydration   Recurrent falls   Multiple rib fractures   Pyuria   Elevated liver enzymes  Generalized weakness with recurrent falls: Multifactorial including dehydration, dementia and possible infection.  Some degree of contractures in the legs on my exam.  UA with pyuria but no fever or leukocytosis.  Patient cannot provide history. -Check orthostatic vitals -Continue gentle hydration -PT/OT -Fall precaution  Closed fracture multiple ribs on the left side, POA: Noted on CT on 08/03/2022. -Supportive care with scheduled Tylenol  Elevated liver enzymes: Mild.  Bili and ALP within normal.  Due to rhabdo? -Check CK, acute hepatitis panel and HIV -Continue monitoring   Moderate left pleural effusion with left lower lobe atelectasis: Had thoracocentesis with removal of 350 cc dark, bloody fluid.  Likely hemothorax related to rib injury. -Follow-up fluid studies -Already on ceftriaxone for possible UTI   Lewy body dementia without behavioral disturbance: She is oriented to self and place.  Follows some commands.  She mumbles and difficult to understand. -Reorientation and delirium precautions -Continue home meds -Outpatient follow-up with neurology   Stable staghorn right renal calculi Pyuria: Difficult to tell if she has UTI or not.  And cannot provide history.  No fever or leukocytosis.  No suprapubic tenderness -Continue IV ceftriaxone pending culture  Hypokalemia: -Monitor replenish as appropriate    Body mass index is 21.28 kg/m.  DVT prophylaxis:  enoxaparin (LOVENOX) injection 40 mg Start: 08/07/22 1400  Code Status: Full code Family Communication: None at bedside Level of care: Telemetry Status is: Inpatient Remains inpatient appropriate because: Generalized weakness, recurrent fall   Final disposition: TBD Consultants:  None  Sch Meds:  Scheduled Meds:  acetaminophen  650 mg Oral Q6H WA   cholecalciferol  1,000 Units  Oral Daily   donepezil  10 mg Oral QHS   enoxaparin (LOVENOX) injection  40 mg Subcutaneous Q24H   escitalopram  10 mg Oral QHS   memantine  10 mg Oral BID   Continuous Infusions:  cefTRIAXone (ROCEPHIN)  IV Stopped (08/07/22 1132)   lactated ringers     PRN Meds:.melatonin, polyethylene glycol, prochlorperazine  Antimicrobials: Anti-infectives (From admission, onward)    Start     Dose/Rate Route Frequency Ordered Stop   08/07/22 0630  cefTRIAXone (ROCEPHIN) 2 g in sodium chloride 0.9 % 100 mL IVPB        2 g 200 mL/hr over 30 Minutes Intravenous Every 24 hours 08/07/22 0608          I have personally reviewed the following labs and images: CBC: Recent Labs  Lab 08/03/22 1224 08/06/22 1618 08/07/22 0648  WBC 9.1 8.8 8.7  NEUTROABS 6.7 6.6  --   HGB 11.6* 13.0 11.7*  HCT 34.8* 40.1 36.0  MCV 93.5 97.3 97.8  PLT 236.0 301 284   BMP &GFR Recent Labs  Lab 08/03/22 1224 08/06/22 1618 08/07/22 0648  NA 138 141 139  K 3.8 3.9 3.4*  CL 101 107 109  CO2 26 24 22   GLUCOSE 98 115* 85  BUN 27* 26* 23  CREATININE 0.87 0.90 0.78  CALCIUM 9.8 9.2 8.5*  MG  --   --  2.2  PHOS  --   --  2.8   Estimated Creatinine Clearance: 49.2 mL/min (by C-G formula based on SCr of 0.78 mg/dL). Liver & Pancreas: Recent Labs  Lab 08/03/22 1224 08/06/22 1618 08/07/22 0648  AST 77* 99* 83*  ALT 140* 119* 99*  ALKPHOS 76 65 58  BILITOT 0.9 1.0 1.1  PROT 7.5 7.3 6.5  ALBUMIN 4.2 3.9 3.4*   Recent Labs  Lab 08/06/22 1618  LIPASE 37   No results for input(s): "AMMONIA" in the last 168 hours. Diabetic: No results for input(s): "HGBA1C" in the last 72 hours. No results for input(s): "GLUCAP" in the last 168 hours. Cardiac Enzymes: No results for input(s): "CKTOTAL", "CKMB", "CKMBINDEX", "TROPONINI" in the last 168 hours. No results for input(s): "PROBNP" in the last 8760 hours. Coagulation Profile: No results for input(s): "INR", "PROTIME" in the last 168 hours. Thyroid  Function Tests: No results for input(s): "TSH", "T4TOTAL", "FREET4", "T3FREE", "THYROIDAB" in the last 72 hours. Lipid Profile: No results for input(s): "CHOL", "HDL", "LDLCALC", "TRIG", "CHOLHDL", "LDLDIRECT" in the last 72 hours. Anemia Panel: No results for input(s): "VITAMINB12", "FOLATE", "FERRITIN", "TIBC", "IRON", "RETICCTPCT" in the last 72 hours. Urine analysis:    Component Value Date/Time   COLORURINE YELLOW 08/07/2022 0517   APPEARANCEUR CLEAR 08/07/2022 0517   LABSPEC >1.046 (H) 08/07/2022 0517   PHURINE 5.0 08/07/2022 0517   GLUCOSEU NEGATIVE 08/07/2022 0517   HGBUR MODERATE (A) 08/07/2022 0517   BILIRUBINUR NEGATIVE 08/07/2022 0517   BILIRUBINUR neg 08/24/2021 1104   KETONESUR 20 (A) 08/07/2022 0517   PROTEINUR 30 (A) 08/07/2022 0517   UROBILINOGEN 0.2 08/24/2021 1104   NITRITE NEGATIVE 08/07/2022 0517   LEUKOCYTESUR LARGE (A) 08/07/2022 0517  Sepsis Labs: Invalid input(s): "PROCALCITONIN", "LACTICIDVEN"  Microbiology: Recent Results (from the past 240 hour(s))  Urine Culture     Status: None   Collection Time: 08/03/22 12:12 PM   Specimen: Urine  Result Value Ref Range Status   MICRO NUMBER: [id number]  Final   SPECIMEN QUALITY: Adequate  Final   Sample Source URINE, CLEAN CATCH  Final   STATUS: FINAL  Final   Result:   Final    Mixed genital flora isolated. These superficial bacteria are not indicative of a urinary tract infection. No further organism identification is warranted on this specimen. If clinically indicated, recollect clean-catch, mid-stream urine and transfer  immediately to Urine Culture Transport Tube.     Radiology Studies: US THORACENTESIS ASP PLEURAL SPACE W/IMG GUIDE  Result Date: 08/07/2022 INDICATION: Patient with history of low vitamin and multiple recurrent falls. Imaging shows numerous left rib fractures involving 6, 7 and possibly eighth rib. CT found moderate left pleural effusion. Request received for diagnostic and therapeutic  left thoracentesis. EXAM: ULTRASOUND GUIDED DIAGNOSTIC AND THERAPEUTIC LEFT THORACENTESIS MEDICATIONS: 10 mL 1 % lidocaine COMPLICATIONS: None immediate. PROCEDURE: An ultrasound guided thoracentesis was thoroughly discussed with the patient and questions answered. The benefits, risks, alternatives and complications were also discussed. The patient understands and wishes to proceed with the procedure. Written consent was obtained. Ultrasound was performed to localize and mark an adequate pocket of fluid in the left chest. The area was then prepped and draped in the normal sterile fashion. 1% Lidocaine was used for local anesthesia. Under ultrasound guidance a 6 Fr Safe-T-Centesis catheter was introduced. Thoracentesis was performed. The catheter was removed and a dressing applied. FINDINGS: A total of approximately 350 cc of dark, bloody fluid was removed. Samples were sent to the laboratory as requested by the clinical team. IMPRESSION: Successful ultrasound guided left thoracentesis yielding 350 cc of pleural fluid. Read by: Narda Rutherford, AGNP-BC Electronically Signed   By: Aletta Edouard M.D.   On: 08/07/2022 12:20   CT ABDOMEN PELVIS W CONTRAST  Result Date: 08/06/2022 CLINICAL DATA:  Abdominal pain, dementia, history of colitis, multiple falls EXAM: CT ABDOMEN AND PELVIS WITH CONTRAST TECHNIQUE: Multidetector CT imaging of the abdomen and pelvis was performed using the standard protocol following bolus administration of intravenous contrast. RADIATION DOSE REDUCTION: This exam was performed according to the departmental dose-optimization program which includes automated exposure control, adjustment of the mA and/or kV according to patient size and/or use of iterative reconstruction technique. CONTRAST:  138mL OMNIPAQUE IOHEXOL 300 MG/ML  SOLN COMPARISON:  07/05/2022, 08/06/2022 FINDINGS: Lower chest: Dense left lower lobe consolidation consistent with atelectasis. Moderate left pleural effusion.  Hypoventilatory changes at the right lung base. No pneumothorax. Hepatobiliary: Evaluation limited due to patient motion. No acute liver abnormality. The gallbladder is grossly normal. Pancreas: Unremarkable. No pancreatic ductal dilatation or surrounding inflammatory changes. Spleen: Normal in size without focal abnormality. Adrenals/Urinary Tract: Stable right-sided staghorn calculi. No obstructive uropathy within either kidney. No evidence of acute renal injury. The adrenals and bladder are grossly unremarkable. Stomach/Bowel: No bowel obstruction or ileus. No bowel wall thickening or inflammatory change. Vascular/Lymphatic: No significant vascular findings are present. No enlarged abdominal or pelvic lymph nodes. Reproductive: Uterus and bilateral adnexa are unremarkable. Other: No free fluid or free intraperitoneal gas. No abdominal wall hernia. Musculoskeletal: There are acute left lateral sixth through ninth rib fractures. Subacute healing left posterior ninth through twelfth rib fractures are also seen, with moderate callus formation. No other acute bony abnormalities. Chronic  L1 compression deformity with evidence of prior vertebral augmentation. Reconstructed images demonstrate no additional findings. IMPRESSION: 1. Acute displaced left lateral sixth through ninth rib fractures. 2. Moderate left pleural effusion, with left lower lobe atelectasis. 3. Subacute healing left posterior ninth through twelfth rib fractures. 4. Stable staghorn right renal calculi. No obstructive uropathy within either kidney. Electronically Signed   By: Sharlet Salina M.D.   On: 08/06/2022 22:43   DG Chest 2 View  Result Date: 08/06/2022 CLINICAL DATA:  Rib fractures.  Evaluate for pneumothorax. EXAM: CHEST - 2 VIEW COMPARISON:  Chest radiograph 08/03/2022 FINDINGS: Patient is rotated to the left. Stable cardiac and mediastinal contours. Aortic tortuosity. Interval development of moderate left pleural effusion with underlying  consolidation within the left lower hemithorax. No definite pneumothorax. Kyphoplasty material within the thoracolumbar spine. Redemonstrated multiple left lower lateral displaced rib fractures. IMPRESSION: 1. Interval development of moderate left pleural effusion with underlying consolidation, likely atelectasis. 2. Redemonstrated multiple left lower lateral rib fractures. Electronically Signed   By: Annia Belt M.D.   On: 08/06/2022 16:24      Aleicia Kenagy T. Tatem Fesler [facility]  If 7PM-7AM, please contact night-coverage [email or web address] 08/07/2022, 1:48 PM

## 2022-08-07 NOTE — Evaluation (Signed)
Physical Therapy Evaluation Patient Details Name: Julia Palmer MRN: [id number] DOB: 1941/06/27 Today's Date: 08/07/2022  History of Present Illness  Julia Palmer is a 81 y.o. female with medical history significant for Lewy body dementia, chronic anxiety/depression, who presented from home with generalized weakness and recurrent falls for the past few days. Imaging revealed 4 left rib fractures with moderate left pleural effusion.   Clinical Impression  Julia Palmer is 81 y.o. female admitted with above HPI and diagnosis. Patient is currently limited by functional impairments below (see PT problem list). Patient lives with her spouse and requires assist with mobility and all ADL's at baseline but typically can ambulate with [facility] and can feed self with finger foods and set up assist. Currently she requires Max/Total Assist for bed mobility. Patient will benefit from continued skilled PT interventions to address impairments and progress independence with mobility, recommending SNF rehab as pt is below baseline and spouse cannot provide assist at level of need. Acute PT will follow and progress as able.        Recommendations for follow up therapy are one component of a multi-disciplinary discharge planning process, led by the attending physician.  Recommendations may be updated based on patient status, additional functional criteria and insurance authorization.  Follow Up Recommendations Skilled nursing-short term rehab (<3 hours/day) Can patient physically be transported by private vehicle: No    Assistance Recommended at Discharge Frequent or constant Supervision/Assistance  Patient can return home with the following  Two people to help with walking and/or transfers;Two people to help with bathing/dressing/bathroom;Assistance with cooking/housework;Assistance with feeding;Direct supervision/assist for medications management;Direct supervision/assist for financial management;Assist  for transportation;Help with stairs or ramp for entrance    Equipment Recommendations None recommended by PT (TBA)  Recommendations for Other Services       Functional Status Assessment Patient has had a recent decline in their functional status and/or demonstrates limited ability to make significant improvements in function in a reasonable and predictable amount of time     Precautions / Restrictions Precautions Precautions: Fall Restrictions Weight Bearing Restrictions: No      Mobility  Bed Mobility Overal bed mobility: Needs Assistance Bed Mobility: Supine to Sit, Sit to Supine     Supine to sit: Max assist, +2 for physical assistance, +2 for safety/equipment, HOB elevated Sit to supine: Max assist, Total assist, +2 for physical assistance, +2 for safety/equipment   General bed mobility comments: pt unable to initaite mobility to EOB, pt able to grasp therapist's arm/hand for support during pivot to sit.  pt unable to hold seated balance and required Mod-max support posteriorly to maintain balance. pt required Max/Total to return to supine.    Transfers                        Ambulation/Gait                  Stairs            Wheelchair Mobility    Modified Rankin (Stroke Patients Only)       Balance Overall balance assessment: History of Falls                                           Pertinent Vitals/Pain Pain Assessment Pain Assessment: PAINAD Breathing: normal Negative Vocalization: occasional moan/groan, low speech, negative/disapproving quality Facial Expression:  sad, frightened, frown Body Language: tense, distressed pacing, fidgeting Consolability: distracted or reassured by voice/touch PAINAD Score: 4 Pain Location: low back Pain Descriptors / Indicators: Grimacing, Guarding Pain Intervention(s): Limited activity within patient's tolerance, Monitored during session, Repositioned    Home Living  Family/patient expects to be discharged to:: Unsure Living Arrangements: Spouse/significant other Available Help at Discharge: Family;Neighbor;Available 24 hours/day Type of Home: House       Alternate Level Stairs-Number of Steps: Chair lift to 2nd floor where bedroom and bathoom are. Home Layout: Two level   Additional Comments: Chair lift. Unable to complete all bathroom/DME questions as spouse leaving room with pt for imaging.    Prior Function Prior Level of Function : History of Falls (last six months);Needs assist  Cognitive Assist : ADLs (cognitive);Mobility (cognitive) Mobility (Cognitive): Step by step cues ADLs (Cognitive): Step by step cues Physical Assist : ADLs (physical);Mobility (physical) Mobility (physical): Gait;Transfers;Bed mobility ADLs (physical): Feeding;Grooming;Bathing;Dressing;Toileting;IADLs Mobility Comments: Pt's spouse denies use of walker or wheelchair. States that he provides bilateral hand held assist with limited ambulation in home, mostly to/from bathroom. ADLs Comments: Pt reports that pt will sometimes feed self, can use utensils occasionally but better with finger foods.  P has been working on oral hygiene but had recent oral surgery and needs assistance. Spouse assists with bathing, dressing UB and LB, feeding as needed, going into bathroom and hygiene as pt voids in a diaper brief.     Hand Dominance   Dominant Hand: Right    Extremity/Trunk Assessment   Upper Extremity Assessment Upper Extremity Assessment: Defer to OT evaluation    Lower Extremity Assessment Lower Extremity Assessment: RLE deficits/detail;LLE deficits/detail RLE Deficits / Details: Pt with limited AROM and cognition impaired ability to follow commands. PROM limited with flexion contractures of bil LE's and pt lacking ~30* extension bil. quads also restricted and pt unable to flex past ~90*. RLE Coordination: decreased gross motor;decreased fine motor LLE Deficits /  Details: Pt with limited AROM and cognition impaired ability to follow commands. PROM limited with flexion contractures of bil LE's and pt lacking ~30* extension bil. quads also restricted and pt unable to flex past ~90*. LLE Coordination: decreased fine motor;decreased gross motor    Cervical / Trunk Assessment Cervical / Trunk Assessment: Kyphotic  Communication   Communication: Expressive difficulties (Very difficult to decifer ~60-70% of pt's verbalizations. Very low muttering.)  Cognition Arousal/Alertness: Awake/alert Behavior During Therapy: Flat affect Overall Cognitive Status: History of cognitive impairments - at baseline                                 General Comments: Lewy Body Dementia, advanced.        General Comments      Exercises     Assessment/Plan    PT Assessment Patient needs continued PT services  PT Problem List Decreased strength;Decreased activity tolerance;Decreased balance;Decreased range of motion;Decreased mobility;Decreased cognition;Decreased knowledge of use of DME;Decreased safety awareness;Decreased knowledge of precautions;Decreased skin integrity       PT Treatment Interventions DME instruction;Gait training;Stair training;Functional mobility training;Therapeutic activities;Therapeutic exercise;Balance training;Neuromuscular re-education;Patient/family education    PT Goals (Current goals can be found in the Care Plan section)  Acute Rehab PT Goals Patient Stated Goal: per pt's spouse with OT, for pt to get to rehab PT Goal Formulation: Patient unable to participate in goal setting Time For Goal Achievement: 08/21/22 Potential to Achieve Goals: Fair    Frequency Min  2X/week     Co-evaluation               AM-PAC PT "6 Clicks" Mobility  Outcome Measure Help needed turning from your back to your side while in a flat bed without using bedrails?: Total Help needed moving from lying on your back to sitting on the  side of a flat bed without using bedrails?: Total Help needed moving to and from a bed to a chair (including a wheelchair)?: Total Help needed standing up from a chair using your arms (e.g., wheelchair or bedside chair)?: Total Help needed to walk in hospital room?: Total Help needed climbing 3-5 steps with a railing? : Total 6 Click Score: 6    End of Session   Activity Tolerance: Patient tolerated treatment well Patient left: in bed;with call bell/phone within reach Nurse Communication: Mobility status PT Visit Diagnosis: Other abnormalities of gait and mobility (R26.89);Muscle weakness (generalized) (M62.81);Difficulty in walking, not elsewhere classified (R26.2)    Time: 1517-6160 PT Time Calculation (min) (ACUTE ONLY): 15 min   Charges:   PT Evaluation $PT Eval Moderate Complexity: 1 Mod          Verner Mould, DPT Acute Rehabilitation Services Office [phone]  08/07/22 4:33 PM

## 2022-08-07 NOTE — H&P (Addendum)
History and Physical  Julia Palmer FHQ:[id number] DOB: [birth year] DOA: 08/06/2022  Referring physician: Dr. Renaye Rakers, EDP  PCP: Shelva Majestic, MD  Outpatient Specialists: Neurology, GI. Patient coming from: Home where she lives with her husband.  Chief Complaint: Generalized weakness, recurrent falls, abdominal pain.  HPI: Julia Palmer is a 81 y.o. female with medical history significant for Lewy body dementia, chronic anxiety/depression, who presented from home with generalized weakness and recurrent falls for the past few days.  The patient is unable to provide a history due to her advanced dementia.  The patient has had several falls over the past few days.  She presented to the ED on 08/03/2022, chest x-ray at that time showed ribs fracture involving left 6, 7, and possibly eighth rib.  She was prescribed tramadol by her PCP.  Has become progressively weaker to the point where she cannot stand on her own.  Her PCP had advised to stop tramadol.  Reportedly, the patient had complaints of abdominal pain at home with poor oral intake.  In the ED, she is alert but mumbles.  Somewhat verbal at baseline.  Imaging revealed 4 left rib fractures with moderate left pleural effusion.  EDP requested admission for further management.  The patient was admitted by [facility], hospitalist service.  ED Course: Tmax 98.9.  BP 122/74, pulse 85, respiratory rate 18, O2 saturation 95% on room air.  BUN 26, creatinine 0.90.  AST 99, ALT 119.  CBC essentially unremarkable.  Review of Systems: Review of systems as noted in the HPI. All other systems reviewed and are negative.   Past Medical History:  Diagnosis Date   Arthritis    Compression fracture of first lumbar vertebra (HCC)    Dementia (HCC)    Iron deficiency anemia    Macular degeneration    Osteoporosis    Wears glasses    Past Surgical History:  Procedure Laterality Date   BUNIONECTOMY WITH WEIL OSTEOTOMY Left 05/27/2014   Procedure: Left  First Metatarsal Scarf Osteotomy, Second Metatarsal Weil, Modified Mcbride Bunionectomy, Hammertoe Correction;  Surgeon: Toni Arthurs, MD;  Location: [facility];  Service: Orthopedics;  Laterality: Left;   CARDIAC CATHETERIZATION     2012   COLONOSCOPY     EXTRACORPOREAL SHOCK WAVE LITHOTRIPSY Right 12/07/2021   Procedure: RIGHT EXTRACORPOREAL SHOCK WAVE LITHOTRIPSY (ESWL);  Surgeon: Despina Arias, MD;  Location: [facility];  Service: Urology;  Laterality: Right;   FOOT SURGERY  2015,2000   bilateral   GANGLION CYST EXCISION  1963   wrist-lt   KYPHOPLASTY N/A 07/16/2019   Procedure: LUMBAR 1 KYPHOPLASTY;  Surgeon: Venita Lick, MD;  Location: [facility] OR;  Service: Orthopedics;  Laterality: N/A;  60 mins   TUBAL LIGATION      Social History:  reports that she has never smoked. She has never used smokeless tobacco. She reports that she does not drink alcohol and does not use drugs.   Allergies  Allergen Reactions   Codeine Nausea And Vomiting    REACTION: Nausea, Vomitting    Family History  Problem Relation Age of Onset   Dementia Mother    Stroke Father        early 61s, smoker   Kidney disease Brother    Cancer Sister        breast      Prior to Admission medications   Medication Sig Start Date End Date Taking? Authorizing Provider  cholecalciferol (VITAMIN D3) 25 MCG (1000 UNIT) tablet  Take 1,000 Units by mouth daily.    [provider]  donepezil (ARICEPT) 10 MG tablet TAKE 1 TABLET BY MOUTH DAILY 11/15/21   Cameron Sprang, MD  escitalopram (LEXAPRO) 10 MG tablet TAKE 1 TABLET(10 MG) BY MOUTH AT BEDTIME 05/31/22   Cameron Sprang, MD  memantine (NAMENDA) 10 MG tablet TAKE 1 TABLET BY MOUTH IN THE MORNING AND IN THE EVENING 11/15/21   Cameron Sprang, MD  Multiple Vitamins-Minerals (ICAPS AREDS 2 PO) Take by mouth.    [provider]  potassium chloride (KLOR-CON) 10 MEQ tablet Take 10 mEq by mouth daily. 07/04/22   [provider]  traMADol (ULTRAM) 50 MG tablet Take 1 tablet (50 mg total) by mouth every 8 (eight) hours as needed for up to 5 days. 08/03/22 08/08/22  Marin Olp, MD    Physical Exam: BP 122/74   Pulse 85   Temp 98.1 F (36.7 C) (Oral)   Resp 18   Ht 5\' 4"  (1.626 m)   Wt 56.2 kg   SpO2 95%   BMI 21.28 kg/m   General: 81 y.o. year-old female frail-appearing in no acute distress.  Alert and confused in the setting of advanced dementia. Cardiovascular: Regular rate and rhythm with no rubs or gallops.  No thyromegaly or JVD noted.  No lower extremity edema. 2/4 pulses in all 4 extremities. Respiratory: Clear to auscultation with no wheezes or rales.  Poor inspiratory effort. Abdomen: Soft nontender nondistended with normal bowel sounds x4 quadrants. Muskuloskeletal: No cyanosis, clubbing or edema noted bilaterally Neuro: CN II-XII intact, strength, sensation, reflexes Skin: No ulcerative lesions noted or rashes Psychiatry: Judgement and insight appear altered. Mood is appropriate for condition and setting          Labs on Admission:  Basic Metabolic Panel: Recent Labs  Lab 08/03/22 1224 08/06/22 1618  NA 138 141  K 3.8 3.9  CL 101 107  CO2 26 24  GLUCOSE 98 115*  BUN 27* 26*  CREATININE 0.87 0.90  CALCIUM 9.8 9.2   Liver Function Tests: Recent Labs  Lab 08/03/22 1224 08/06/22 1618  AST 77* 99*  ALT 140* 119*  ALKPHOS 76 65  BILITOT 0.9 1.0  PROT 7.5 7.3  ALBUMIN 4.2 3.9   Recent Labs  Lab 08/06/22 1618  LIPASE 37   No results for input(s): "AMMONIA" in the last 168 hours. CBC: Recent Labs  Lab 08/03/22 1224 08/06/22 1618  WBC 9.1 8.8  NEUTROABS 6.7 6.6  HGB 11.6* 13.0  HCT 34.8* 40.1  MCV 93.5 97.3  PLT 236.0 301   Cardiac Enzymes: No results for input(s): "CKTOTAL", "CKMB", "CKMBINDEX", "TROPONINI" in the last 168 hours.  BNP (last 3 results) No results for input(s): "BNP" in the last 8760 hours.  ProBNP (last 3 results) No results  for input(s): "PROBNP" in the last 8760 hours.  CBG: No results for input(s): "GLUCAP" in the last 168 hours.  Radiological Exams on Admission: CT ABDOMEN PELVIS W CONTRAST  Result Date: 08/06/2022 CLINICAL DATA:  Abdominal pain, dementia, history of colitis, multiple falls EXAM: CT ABDOMEN AND PELVIS WITH CONTRAST TECHNIQUE: Multidetector CT imaging of the abdomen and pelvis was performed using the standard protocol following bolus administration of intravenous contrast. RADIATION DOSE REDUCTION: This exam was performed according to the departmental dose-optimization program which includes automated exposure control, adjustment of the mA and/or kV according to patient size and/or use of iterative reconstruction technique. CONTRAST:  116mL OMNIPAQUE IOHEXOL 300 MG/ML  SOLN COMPARISON:  07/05/2022, 08/06/2022 FINDINGS: Lower chest: Dense left lower lobe consolidation consistent with atelectasis. Moderate left pleural effusion. Hypoventilatory changes at the right lung base. No pneumothorax. Hepatobiliary: Evaluation limited due to patient motion. No acute liver abnormality. The gallbladder is grossly normal. Pancreas: Unremarkable. No pancreatic ductal dilatation or surrounding inflammatory changes. Spleen: Normal in size without focal abnormality. Adrenals/Urinary Tract: Stable right-sided staghorn calculi. No obstructive uropathy within either kidney. No evidence of acute renal injury. The adrenals and bladder are grossly unremarkable. Stomach/Bowel: No bowel obstruction or ileus. No bowel wall thickening or inflammatory change. Vascular/Lymphatic: No significant vascular findings are present. No enlarged abdominal or pelvic lymph nodes. Reproductive: Uterus and bilateral adnexa are unremarkable. Other: No free fluid or free intraperitoneal gas. No abdominal wall hernia. Musculoskeletal: There are acute left lateral sixth through ninth rib fractures. Subacute healing left posterior ninth through twelfth  rib fractures are also seen, with moderate callus formation. No other acute bony abnormalities. Chronic L1 compression deformity with evidence of prior vertebral augmentation. Reconstructed images demonstrate no additional findings. IMPRESSION: 1. Acute displaced left lateral sixth through ninth rib fractures. 2. Moderate left pleural effusion, with left lower lobe atelectasis. 3. Subacute healing left posterior ninth through twelfth rib fractures. 4. Stable staghorn right renal calculi. No obstructive uropathy within either kidney. Electronically Signed   By: Sharlet Salina M.D.   On: 08/06/2022 22:43   DG Chest 2 View  Result Date: 08/06/2022 CLINICAL DATA:  Rib fractures.  Evaluate for pneumothorax. EXAM: CHEST - 2 VIEW COMPARISON:  Chest radiograph 08/03/2022 FINDINGS: Patient is rotated to the left. Stable cardiac and mediastinal contours. Aortic tortuosity. Interval development of moderate left pleural effusion with underlying consolidation within the left lower hemithorax. No definite pneumothorax. Kyphoplasty material within the thoracolumbar spine. Redemonstrated multiple left lower lateral displaced rib fractures. IMPRESSION: 1. Interval development of moderate left pleural effusion with underlying consolidation, likely atelectasis. 2. Redemonstrated multiple left lower lateral rib fractures. Electronically Signed   By: Annia Belt M.D.   On: 08/06/2022 16:24    EKG: I independently viewed the EKG done and my findings are as followed: None available at the time of this visit.  Assessment/Plan Present on Admission: **None**  Principal Problem:   Generalized weakness  Generalized weakness with recurrent falls, suspect multifactorial Advanced dementia, dehydration Gentle IV fluid hydration LR 50 cc/h x 2 days Treat underlying conditions, UA is pending at the time of this dictation. PT OT assessment Fall precautions  Closed fracture multiple ribs on the left side, POA Left rib fractures  previously seen on x-ray done on 08/03/2022 and also seen on CT abdomen and pelvis with contrast done on 08/06/2022 Analgesics as needed  Moderate left pleural effusion with left lower lobe atelectasis IR consult for possible left thoracentesis Incentive spirometer Mobilize as tolerated with fall precautions.  Lewy body dementia Follows with neurology Resume home regimen Fall/aspiration/delirium precautions One-to-one sitter for patient's own safety, high fall risk.  Stable staghorn right renal calculi No obstructive uropathy within either kidney Closely monitor, outpatient urology follow-up Addendum: UA positive for pyuria, started Rocephin.  Presumptive UTI UA positive for pyuria Started Rocephin empirically, 2 g daily Follow urine culture for ID and sensitivities and de-escalate antibiotics as able    DVT prophylaxis: Subcu Lovenox daily  Code Status: Full code, by default, no family members at bedside to provide CODE STATUS.  Family Communication: None at bedside.  Disposition Plan: Admitted to telemetry unit  Consults called: None.  Admission status:  Inpatient status.   Status is: Inpatient The patient requires at least 2 midnights for further evaluation and treatment of present condition.   Darlin Drop MD [facility] Pager [phone]  If 7PM-7AM, please contact night-coverage [email or web address] Password [facility]  08/07/2022, 5:49 AM

## 2022-08-07 NOTE — Procedures (Signed)
PROCEDURE SUMMARY:  Successful US guided diagnostic and therapeutic left  thoracentesis. Yielded 350 cc of dark, bloody fluid. Pt tolerated procedure well. No immediate complications.  Specimen was sent for labs. CXR ordered.  EBL < 1 mL  Tyson Alias, AGNP 08/07/2022 11:38 AM

## 2022-08-07 NOTE — IPAL (Signed)
  Interdisciplinary Goals of Care Family Meeting   Date carried out: 08/07/2022  Location of Julia meeting: Bedside  Member's involved: Physician, Bedside Registered Nurse, and Family Member or next of kin husband and daughter)  Durable Power of Forensic psychologist or Loss adjuster, chartered: Husband  Discussion: We discussed goals of care for Julia Palmer with focus on CODE STATUS including pros and cons of CPR and intubation in light of her current illness, comorbid conditions and frailty.  She seems to have advanced Lewy body dementia.  Patient's daughter reports significant decline over Julia last 3 to 4 days.  Prior to that, she was awake alert and able to communicate clearly.  Now it is difficult to understand her.  She is awake but appears to be confused.  So far we know that she is dehydrated.  Her UA is concerning for UTI.  It seems she was recently started on tramadol by PCP.  This could contribute to her mental status and also put her at risk of seizure.  I told patient's husband and daughter that chance of successfully CPR is very slim if we happen to be in that situation.  It also comes at significant cost including significant pain and discomfort, broken ribs and may be significant brain damage and poor quality of life afterward.  They both voiced understanding this, and felt DNR/DNI is appropriate given her situation.  They have appreciated Julia care and information.  CODE STATUS changed to DNR/DNI.  In terms of encephalopathy-I have ordered encephalopathy work-up including TSH, ammonia, B1, B12, EEG and CT head.  Started high-dose vitamin B1.  She will be n.p.o. pending SLP eval. I also added dextrose to IV fluid.   Code status: Limited Code or DNR with short term  Disposition: Continue current acute care  Time spent for Julia meeting: 35 minutes    Mercy Riding, MD  08/07/2022, 6:02 PM

## 2022-08-08 ENCOUNTER — Inpatient Hospital Stay (HOSPITAL_COMMUNITY): Payer: Medicare Other

## 2022-08-08 ENCOUNTER — Ambulatory Visit: Payer: Medicare Other | Admitting: Internal Medicine

## 2022-08-08 ENCOUNTER — Telehealth: Payer: Self-pay | Admitting: Neurology

## 2022-08-08 ENCOUNTER — Inpatient Hospital Stay (HOSPITAL_COMMUNITY)
Admit: 2022-08-08 | Discharge: 2022-08-08 | Disposition: A | Discharge status: Home / Self Care | Payer: Medicare Other | Attending: Student | Admitting: Student

## 2022-08-08 DIAGNOSIS — R531 Weakness: Secondary | ICD-10-CM | POA: Diagnosis not present

## 2022-08-08 DIAGNOSIS — F0280 Dementia in other diseases classified elsewhere without behavioral disturbance: Secondary | ICD-10-CM | POA: Diagnosis not present

## 2022-08-08 DIAGNOSIS — E860 Dehydration: Secondary | ICD-10-CM

## 2022-08-08 DIAGNOSIS — S2243XD Multiple fractures of ribs, bilateral, subsequent encounter for fracture with routine healing: Secondary | ICD-10-CM

## 2022-08-08 DIAGNOSIS — S2242XA Multiple fractures of ribs, left side, initial encounter for closed fracture: Secondary | ICD-10-CM | POA: Diagnosis not present

## 2022-08-08 DIAGNOSIS — Z515 Encounter for palliative care: Secondary | ICD-10-CM

## 2022-08-08 DIAGNOSIS — Z7189 Other specified counseling: Secondary | ICD-10-CM

## 2022-08-08 DIAGNOSIS — Z79899 Other long term (current) drug therapy: Secondary | ICD-10-CM | POA: Diagnosis not present

## 2022-08-08 DIAGNOSIS — G3183 Dementia with Lewy bodies: Secondary | ICD-10-CM | POA: Diagnosis not present

## 2022-08-08 DIAGNOSIS — R748 Abnormal levels of other serum enzymes: Secondary | ICD-10-CM

## 2022-08-08 DIAGNOSIS — R4182 Altered mental status, unspecified: Secondary | ICD-10-CM

## 2022-08-08 DIAGNOSIS — R451 Restlessness and agitation: Secondary | ICD-10-CM | POA: Diagnosis not present

## 2022-08-08 DIAGNOSIS — Z66 Do not resuscitate: Secondary | ICD-10-CM

## 2022-08-08 DIAGNOSIS — J90 Pleural effusion, not elsewhere classified: Secondary | ICD-10-CM

## 2022-08-08 DIAGNOSIS — R296 Repeated falls: Secondary | ICD-10-CM

## 2022-08-08 DIAGNOSIS — F32A Depression, unspecified: Secondary | ICD-10-CM

## 2022-08-08 DIAGNOSIS — R8281 Pyuria: Secondary | ICD-10-CM

## 2022-08-08 DIAGNOSIS — R52 Pain, unspecified: Secondary | ICD-10-CM

## 2022-08-08 DIAGNOSIS — E440 Moderate protein-calorie malnutrition: Secondary | ICD-10-CM | POA: Insufficient documentation

## 2022-08-08 DIAGNOSIS — F419 Anxiety disorder, unspecified: Secondary | ICD-10-CM | POA: Diagnosis not present

## 2022-08-08 DIAGNOSIS — F02818 Dementia in other diseases classified elsewhere, unspecified severity, with other behavioral disturbance: Secondary | ICD-10-CM

## 2022-08-08 LAB — MAGNESIUM: Magnesium: 2.3 mg/dL (ref 1.7–2.4)

## 2022-08-08 LAB — HEPATITIS PANEL, ACUTE
HCV Ab: NONREACTIVE
Hep A IgM: NONREACTIVE
Hep B C IgM: NONREACTIVE
Hepatitis B Surface Ag: NONREACTIVE

## 2022-08-08 LAB — CBC
HCT: 41.4 % (ref 36.0–46.0)
Hemoglobin: 13 g/dL (ref 12.0–15.0)
MCH: 31.6 pg (ref 26.0–34.0)
MCHC: 31.4 g/dL (ref 30.0–36.0)
MCV: 100.7 fL — ABNORMAL HIGH (ref 80.0–100.0)
Platelets: 298 10*3/uL (ref 150–400)
RBC: 4.11 MIL/uL (ref 3.87–5.11)
RDW: 14.1 % (ref 11.5–15.5)
WBC: 8.7 10*3/uL (ref 4.0–10.5)
nRBC: 0 % (ref 0.0–0.2)

## 2022-08-08 LAB — URINE CULTURE

## 2022-08-08 LAB — COMPREHENSIVE METABOLIC PANEL
ALT: 87 U/L — ABNORMAL HIGH (ref 0–44)
AST: 70 U/L — ABNORMAL HIGH (ref 15–41)
Albumin: 3.3 g/dL — ABNORMAL LOW (ref 3.5–5.0)
Alkaline Phosphatase: 60 U/L (ref 38–126)
Anion gap: 8 (ref 5–15)
BUN: 18 mg/dL (ref 8–23)
CO2: 24 mmol/L (ref 22–32)
Calcium: 8.5 mg/dL — ABNORMAL LOW (ref 8.9–10.3)
Chloride: 110 mmol/L (ref 98–111)
Creatinine, Ser: 0.57 mg/dL (ref 0.44–1.00)
GFR, Estimated: >60 mL/min (ref >60)
Glucose, Bld: 101 mg/dL — ABNORMAL HIGH (ref 70–99)
Potassium: 3.2 mmol/L — ABNORMAL LOW (ref 3.5–5.1)
Sodium: 142 mmol/L (ref 135–145)
Total Bilirubin: 0.9 mg/dL (ref 0.3–1.2)
Total Protein: 6.6 g/dL (ref 6.5–8.1)

## 2022-08-08 LAB — PHOSPHORUS: Phosphorus: 2.5 mg/dL (ref 2.5–4.6)

## 2022-08-08 LAB — CYTOLOGY - NON PAP

## 2022-08-08 LAB — CK: Total CK: 688 U/L — ABNORMAL HIGH (ref 38–234)

## 2022-08-08 MED ORDER — GLYCOPYRROLATE 0.2 MG/ML IJ SOLN
0.2000 mg | INTRAMUSCULAR | Status: DC | PRN
  Administered 2022-08-09 – 2022-08-10 (×2): 0.2 mg via INTRAVENOUS
  Filled 2022-08-08 (×2): qty 1

## 2022-08-08 MED ORDER — ENSURE ENLIVE PO LIQD: 237.0000 mL | Freq: Two times a day (BID) | ORAL | Status: DC

## 2022-08-08 MED ORDER — ADULT MULTIVITAMIN W/MINERALS CH: 1.0000 | ORAL_TABLET | Freq: Every day | ORAL | Status: DC

## 2022-08-08 MED ORDER — POLYVINYL ALCOHOL 1.4 % OP SOLN: 1.0000 [drp] | Freq: Four times a day (QID) | OPHTHALMIC | Status: DC | PRN

## 2022-08-08 MED ORDER — POTASSIUM CHLORIDE 10 MEQ/100ML IV SOLN
10.0000 meq | INTRAVENOUS | Status: DC
  Administered 2022-08-08 (×2): 10 meq via INTRAVENOUS
  Filled 2022-08-08 (×2): qty 100

## 2022-08-08 MED ORDER — LORAZEPAM 2 MG/ML IJ SOLN
0.5000 mg | INTRAMUSCULAR | Status: DC | PRN
  Administered 2022-08-08 – 2022-08-11 (×7): 0.5 mg via INTRAVENOUS
  Filled 2022-08-08 (×8): qty 1

## 2022-08-08 MED ORDER — SODIUM CHLORIDE 0.9 % IV SOLN: INTRAVENOUS | Status: DC | PRN

## 2022-08-08 MED ORDER — MORPHINE SULFATE (PF) 2 MG/ML IV SOLN
1.0000 mg | INTRAVENOUS | Status: DC | PRN
  Administered 2022-08-08: 1 mg via INTRAVENOUS
  Filled 2022-08-08: qty 1

## 2022-08-08 MED ORDER — KCL-LACTATED RINGERS-D5W 20 MEQ/L IV SOLN
INTRAVENOUS | Status: DC
  Filled 2022-08-08 (×2): qty 1000

## 2022-08-08 MED ORDER — POTASSIUM CHLORIDE 20 MEQ PO PACK: 40.0000 meq | PACK | ORAL | Status: DC

## 2022-08-08 NOTE — Telephone Encounter (Signed)
Pt's husband called in and left a message with the access nurse. The pt is currently in the hospital.  He just wanted Dr. Delice Lesch to be aware.

## 2022-08-08 NOTE — Procedures (Signed)
Patient Name: Julia Palmer  MRN: [id number]  Epilepsy Attending: Lora Havens  Referring Physician/Provider: Mercy Riding, MD  Date: 08/08/2022 Duration: 24.01 mins  Patient history: 81 year old female with altered mental status.  EEG to evaluate for seizure.  Level of alertness: Awake  AEDs during EEG study: None  Technical aspects: This EEG study was done with scalp electrodes positioned according to the 10-20 International system of electrode placement. Electrical activity was reviewed with band pass filter of 1-70Hz , sensitivity of 7 uV/mm, display speed of 18mm/sec with a 60Hz  notched filter applied as appropriate. EEG data were recorded continuously and digitally stored.  Video monitoring was available and reviewed as appropriate.  Description: The posterior dominant rhythm consists of 7.5Hz  activity of moderate voltage (25-35 uV) seen predominantly in posterior head regions, symmetric and reactive to eye opening and eye closing. EEG showed continuous generalized 5 to 7 Hz theta slowing. Hyperventilation and photic stimulation were not performed.     ABNORMALITY - Continuous slow, generalized  IMPRESSION: This study is suggestive of mild diffuse encephalopathy, nonspecific etiology. No seizures or epileptiform discharges were seen throughout the recording.  Jamelle Goldston Barbra Sarks

## 2022-08-08 NOTE — Progress Notes (Signed)
EEG complete - results pending 

## 2022-08-08 NOTE — Consult Note (Signed)
Consultation Note Date: 08/08/2022   Patient Name: Julia Palmer  DOB: [birth year]  MRN: [id number]  Age / Sex: 81 y.o., female   PCP: Shelva MajesticHunter, Stephen O, MD Referring Physician: Almon HerculesGonfa, Taye T, MD  Reason for Consultation: Establishing goals of care, Non pain symptom management, Pain control, and Terminal Care     Chief Complaint/History of Present Illness: Patient unable to participate in conversation due to her underlying medical status.  Discussion held with patient's husband and daughter at bedside.  Patient is a 81 year old female with a past medical history of Lewy body dementia, anxiety, depression, bilateral rib fractures, and recurrent falls who was admitted on 10/23 for generalized weakness and dehydration.  During hospitalization patient underwent a thoracentesis for pleural effusion.  Patient has undergone extensive work-up for worsening encephalopathy, in the setting of Lewy body dementia, which has been unrevealing and show no reversible size of worsening confusion.  Palliative care consulted to assist with complex medical decision-making.  Extensive review of EMR prior to presenting to bedside.  Presented to bedside along with RN to discuss patient's care with family.  Patient laying in the bed unable to participate in conversation.  Patient in restraints with mittens.  Patient appears incredibly agitated and cannot stay still at all.  Constantly moving all extremities and state of agitation.  Patient's husband and only child/daughter were present at bedside.  Introduced myself and the role of the palliative care team and patient's care.  Spent extensive time getting to know about the patient prior to her admission.  Husband was able to describe in detail how patient's underlying Lewy body dementia has continued to progress especially over the past 4 to 6 months.  Patient has had increased lethargy, decreased oral intake, inability to ambulate, difficulties with incontinence,  worsening mumbled speech that is incoherent even to her family, and even the inability to sit up on her own.    ------------------------------------------------------------------------------------------------------------- Advance Care Planning Conversation  Pertinent diagnosis: Lewy body dementia, decreased oral intake, increased lethargy  The patient and/or family consented to a voluntary Advance Care Planning Conversation. Individuals present for the conversation: Patient unable to participate in conversation due to underlying dementia; conversation had with patient's husband and only child/daughter along with this palliative provider  Summary of the conversation:  After spending extensive time learning about patient's time at home, we discussed the overall progress of Lewy body dementia.  Husband mentioned that he had been talking to patient's neurologist regarding the "stage" patient is in.  Husband described that he feels patient is in the "last stage of her dementia".  Explained that based on what he has described and patient's continued decline, I agree that patient is in the final stage of her dementia (FAST score 7D at least) and in the final chapter of her life.  Husband and daughter appropriately tearful during discussion though voiced appreciation for this information.  Husband felt patient had been deteriorating and he wants to make sure that he is supporting her and the best way by allowing the patient to be comfortable if this is the end of her life.  We discussed interventions to provide symptom management for the patient such as IV morphine to help with pain and dyspnea.  Also discussed use of Ativan IV to allow patient to be more comfortable and less agitated to hopefully get out of the mittens and restraints as those are uncomfortable.  Discussed discontinuing interventions that are no longer changing the course of her disease or improving her  quality of life such as oral medications  for her dementia, vitamins, lab work, IV fluids, and imaging would no longer be pursued.  Discussed transitioning to comfort care at this time.  Husband and daughter in agreement with this plan.  Also discussed the idea of hospice and patient's appropriateness for this kind of care at the end of life.  Discussed the general philosophy of hospice and the idea of home with hospice versus being appropriate for residential facility.  Noted could transition to comfort care focus at this time to determine how aggressive we will need to be with medications to assist with patient's comfort and management.  Based on this, can further determine if patient is appropriate for home with hospice versus residential hospice facility.  Planning to meet with family again at 1 PM tomorrow to further discuss care moving forward in terms of hospice options based on patient's response to medications started with comfort care today.  Outcome of the conversations and/or documents completed:  Transitioning to comfort care focus at this time.  We will reevaluate tomorrow to determine appropriate hospice referral (home versus residential).  I spent 28 minutes providing separately identifiable ACP services with the patient and/or surrogate decision maker in a voluntary, in-person conversation discussing the patient's wishes and goals as detailed in the above note.  Alvester Morin, DO Palliative Care Provider  ------------------------------------------------------------------------------------------------------------- Did inform husband and daughter that should patient's status further deteriorate when they are not here, staff will call to update them in that patient's time is growing short.  Husband and daughter acknowledged this and knows that it is all in "God's hands" and she will be called home when it is her time.  Empathized with difficult situation.  Allowed space and time for emotional processing.  All questions answered at  that time.  Thanked husband and daughter for allowing me to visit today.  Updated primary hospitalist regarding transition to comfort care after visit with the patient's family.  Will send message to patient's neurologist and primary care provider as well to update at the family's request.  Primary Diagnoses  Present on Admission:  Lewy body dementia with behavioral disturbance (HCC)   Palliative Review of Systems: Unable to obtain ROS due to patient's mentation.  Will note that she appears agitated, cannot be still, and grimacing in pain.  Past Medical History:  Diagnosis Date   Arthritis    Compression fracture of first lumbar vertebra (HCC)    Dementia (HCC)    Iron deficiency anemia    Macular degeneration    Osteoporosis    Wears glasses    Social History   Socioeconomic History   Marital status: Married    Spouse name: Not on file   Number of children: 1   Years of education: Not on file   Highest education level: Not on file  Occupational History   Occupation: Engineer, production: [employer]  Tobacco Use   Smoking status: Never   Smokeless tobacco: Never  Vaping Use   Vaping Use: Never used  Substance and Sexual Activity   Alcohol use: No   Drug use: No   Sexual activity: Yes    Partners: Male  Other Topics Concern   Not on file  Social History Narrative   Pt is R handed   Lives in 2 story home with her husband, Julia Palmer   Married 1964   1 adult child   Some college education   Retired Customer service manager  Social Determinants of Health   Financial Resource Strain: Not on file  Food Insecurity: No Food Insecurity (08/07/2022)   Hunger Vital Sign    Worried About Running Out of Food in the Last Year: Never true    Ran Out of Food in the Last Year: Never true  Transportation Needs: No Transportation Needs (08/07/2022)   PRAPARE - Hydrologist (Medical): No    Lack of Transportation (Non-Medical): No  Physical Activity: Not  on file  Stress: Not on file  Social Connections: Not on file   Family History  Problem Relation Age of Onset   Dementia Mother    Stroke Father        early 69s, smoker   Kidney disease Brother    Cancer Sister        breast   Scheduled Meds:  acetaminophen  650 mg Oral Q6H WA   cholecalciferol  1,000 Units Oral Daily   donepezil  10 mg Oral QHS   enoxaparin (LOVENOX) injection  40 mg Subcutaneous Q24H   escitalopram  10 mg Oral QHS   feeding supplement  237 mL Oral BID BM   memantine  10 mg Oral BID   multivitamin with minerals  1 tablet Oral Daily   potassium chloride  40 mEq Oral Q4H   thiamine (VITAMIN B1) injection  100 mg Intravenous TID   Continuous Infusions:  sodium chloride Stopped (08/08/22 0649)   cefTRIAXone (ROCEPHIN)  IV Stopped (08/08/22 1601)   dextrose 5% lactated ringers with KCl 20 mEq/L 100 mL/hr at 08/08/22 0828   PRN Meds:.sodium chloride, acetaminophen, melatonin, polyethylene glycol, prochlorperazine Allergies  Allergen Reactions   Codeine Nausea And Vomiting    REACTION: Nausea, Vomitting   CBC:    Component Value Date/Time   WBC 8.7 08/08/2022 0342   HGB 13.0 08/08/2022 0342   HCT 41.4 08/08/2022 0342   PLT 298 08/08/2022 0342   MCV 100.7 (H) 08/08/2022 0342   NEUTROABS 6.6 08/06/2022 1618   NEUTROABS 4.20 05/08/2021 0000   LYMPHSABS 1.2 08/06/2022 1618   MONOABS 0.8 08/06/2022 1618   EOSABS 0.1 08/06/2022 1618   BASOSABS 0.1 08/06/2022 1618   Comprehensive Metabolic Panel:    Component Value Date/Time   NA 142 08/08/2022 0554   NA 142 05/08/2021 0000   K 3.2 (L) 08/08/2022 0554   CL 110 08/08/2022 0554   CO2 24 08/08/2022 0554   BUN 18 08/08/2022 0554   BUN 23 (A) 05/08/2021 0000   CREATININE 0.57 08/08/2022 0554   CREATININE 0.71 08/10/2020 1151   GLUCOSE 101 (H) 08/08/2022 0554   CALCIUM 8.5 (L) 08/08/2022 0554   CALCIUM 9.1 08/12/2009 2210   AST 70 (H) 08/08/2022 0554   ALT 87 (H) 08/08/2022 0554   ALKPHOS 60  08/08/2022 0554   BILITOT 0.9 08/08/2022 0554   PROT 6.6 08/08/2022 0554   ALBUMIN 3.3 (L) 08/08/2022 0554    Physical Exam: Vital Signs: BP (!) 155/113 (BP Location: Right Leg) Comment: tenses up  Pulse 100   Temp 98.3 F (36.8 C) (Oral)   Resp 20   Ht 5\' 4"  (1.626 m)   Wt 56.2 kg   SpO2 96%   BMI 21.28 kg/m  SpO2: SpO2: 96 % O2 Device: O2 Device: Room Air Intake/Output Summary (Last 24 hours) at 08/08/2022 1516 Last data filed at 08/08/2022 0659 Gross per 24 hour  Intake 757.25 ml  Output 525 ml  Net 232.25 ml   LBM: Last  BM Date :  (unknown) Baseline Weight: Weight: 56.2 kg Most recent weight: Weight: 56.2 kg  General: Lying in bed, in mittens, agitated, grimacing, chronically ill-appearing, muscle wasting present Eyes: No drainage noted HENT: Dry mucous membranes Cardiovascular: RRR Respiratory: no increase work of breathing noted, not in respiratory distress Abdomen: not distended Extremities: no edema in LE b/l Skin: no rashes or lesions on visible skin Neuro: Agitated, confused, unable to communicate         Palliative Performance Scale: 20%            Additional Data Reviewed: Recent Labs    08/07/22 0648 08/08/22 0342 08/08/22 0554  WBC 8.7 8.7  --   HGB 11.7* 13.0  --   PLT 284 298  --   NA 139  --  142  BUN 23  --  18  CREATININE 0.78  --  0.57    Imaging: EEG adult Charlsie Quest, MD     08/08/2022  2:04 PM Patient Name: Julia Palmer  MRN: [id number]  Epilepsy Attending: Charlsie Quest  Referring Physician/Provider: Almon Hercules, MD  Date: 08/08/2022 Duration: 24.01 mins  Patient history: 81 year old female with altered mental status.   EEG to evaluate for seizure.  Level of alertness: Awake  AEDs during EEG study: None  Technical aspects: This EEG study was done with scalp electrodes  positioned according to the 10-20 International system of  electrode placement. Electrical activity was reviewed with band  pass filter  of 1-70Hz , sensitivity of 7 uV/mm, display speed of  25mm/sec with a 60Hz  notched filter applied as appropriate. EEG  data were recorded continuously and digitally stored.  Video  monitoring was available and reviewed as appropriate.  Description: The posterior dominant rhythm consists of 7.5Hz   activity of moderate voltage (25-35 uV) seen predominantly in  posterior head regions, symmetric and reactive to eye opening and  eye closing. EEG showed continuous generalized 5 to 7 Hz theta  slowing. Hyperventilation and photic stimulation were not performed.     ABNORMALITY - Continuous slow, generalized  IMPRESSION: This study is suggestive of mild diffuse encephalopathy,  nonspecific etiology. No seizures or epileptiform discharges were  seen throughout the recording.  Julia Palmer  CT HEAD WO CONTRAST (Annabelle Harman) CLINICAL DATA:  Increased confusion  EXAM: CT HEAD WITHOUT CONTRAST  TECHNIQUE: Contiguous axial images were obtained from the base of the skull through the vertex without intravenous contrast.  RADIATION DOSE REDUCTION: This exam was performed according to the departmental dose-optimization program which includes automated exposure control, adjustment of the mA and/or kV according to patient size and/or use of iterative reconstruction technique.  COMPARISON:  09/14/2021  FINDINGS: Brain: No evidence of acute infarction, hemorrhage, mass, mass effect, or midline shift. No hydrocephalus or extra-axial fluid collection. Age related cerebral atrophy.  Vascular: No hyperdense vessel.  Skull: Normal. Negative for fracture or focal lesion.  Sinuses/Orbits: No acute finding.  Other: The mastoid air cells are well aerated.  IMPRESSION: No acute intracranial process.  Electronically Signed   By: 14/10/2020 M.D.   On: 08/08/2022 01:16   I personally reviewed recent imaging.   Palliative Care Assessment and Plan Summary of Established Goals of Care and  Medical Treatment Preferences   Patient is a 81 year old female with a past medical history of Lewy body dementia, anxiety, depression, bilateral rib fractures, and recurrent falls who was admitted on 10/23 for generalized weakness and dehydration.  During hospitalization patient underwent a thoracentesis for  pleural effusion.  Patient has undergone extensive work-up for encephalopathy, in the setting of Lewy body dementia, which has been unrevealing and show no reversible size of worsening confusion.  Palliative care consulted to assist with complex medical decision-making.  # Complex medical decision making/goals of care  -Patient unable to participate in conversation due to underlying dementia.  -Extensive conversation had with patient's decision maker/husband and only child/daughter as described above in HPI.  Based on patient's continued deterioration in description from family, patient is now at least a FAST score 7D in terms of her dementia.  Discussed appropriateness for comfort care and hospice at this time based on her dementia scale.  Patient has had increased lethargy, decreased oral intake, increased agitation, inability to communicate, inability to ambulate, inability to even hold herself up without assistance.  -We will transition to comfort care at this time.  Discussed with family that this idea means focusing on patient's symptom burden such as agitation and pain.  Hopefully agitation can be managed enough to get patient out of mittens since those are uncomfortable.  We will discontinue interventions that are low longer assisting patient with quality of life such as lab draws, medications not focused on comfort, imaging, etc.  -  Code Status: DNR  Prognosis: Days to weeks   -Discussed prognosis with family at their request.  With patient's inability to eat or drink, time will likely be short in terms of days to short weeks.  # Symptom management  -Pain, nonverbal and generalized in the  setting of end-of-life care   -Start IV morphine 1-3 mg every 4 hours as needed pain or dyspnea.  May need to further adjust based on patient's response to medication.    -Review of PDMP shows the patient has only been on tramadol and Norco previously.  Not on continual opioid medications at home.   -Anxiety/agitation, currently severe in the setting of end-of-life care   -Start IV Ativan 0.5 mg every 4 hours as needed.  May need to further adjust dose or frequency based on patient's response to medication.  Avoiding Haldol with patient's underlying Lewy body dementia diagnosis.  # Psycho-social/Spiritual Support:  - Support System: Patient's husband and daughter - Desire for further Chaplain support: Patient's husband noted that he and patient are [place] and have great faith in God.  Placed consult for spiritual support.  # Discharge Planning: To be determined.  Transitioning to comfort care at this time.  Possible hospice referral (home versus residential) depending on symptom burden over the next 24 hours and stability for transfer.  Thank you for allowing the palliative care team to participate in the care Julia Cheshire.  Palliative care team will continue to follow along with patient's care.  Alvester Morin, DO Palliative Care Provider PMT # [phone]  If patient remains symptomatic despite maximum doses, please call PMT at [phone] between 0700 and 1900. Outside of these hours, please call attending, as PMT does not have night coverage.

## 2022-08-08 NOTE — Progress Notes (Signed)
PROGRESS NOTE  Julia Palmer ZTI:[id number] DOB: 1940-12-12   PCP: Shelva Majestic, MD  Patient is from: Home.  Lives with husband.  DOA: 08/06/2022 LOS: 2  Chief complaints Chief Complaint  Patient presents with   Weakness   Abdominal Pain     Brief Narrative / Interim history: 81 year old F with PMH of Lewy body dementia, anxiety, depression, bilateral rib fractures and recurrent falls presenting with generalized weakness and recurrent fall, and admitted for dehydration, generalized weakness, elevated liver enzymes and pleural effusion.  She had pyuria on UA and started on IV ceftriaxone.  Urine culture with multiple species.  Patient underwent thoracocentesis for pleural effusion.  Fluid culture NGTD.  Remains confused and altered.  Encephalopathy work-up unrevealing so far.  Subjective: Seen and examined earlier this morning.  No major events overnight of this morning.  She is awake.  Able to tell me her name and  the state.  She follows some commands.  She continuously mumbles and difficult to understand.  She follows some commands.  She seems to have resting tremor.  Objective: Vitals:   08/07/22 2004 08/07/22 2232 08/07/22 2234 08/08/22 0417  BP: (!) 186/99 (!) 158/139 (!) 146/117 (!) 155/113  Pulse: 94 96 (!) 101 100  Resp: 20   20  Temp: 98.4 F (36.9 C)   98.3 F (36.8 C)  TempSrc: Axillary   Oral  SpO2: 96% 98%  96%  Weight:      Height:        Examination:   GENERAL: No apparent distress.  Nontoxic. HEENT: MMM.  Vision and hearing grossly intact.  NECK: Supple.  No apparent JVD.  RESP:  No IWOB.  Fair aeration bilaterally. CVS:  RRR. Heart sounds normal.  ABD/GI/GU: BS+. Abd soft, NTND.  MSK/EXT:  Moves extremities.  Some contractures in lower extremities. SKIN: no apparent skin lesion or wound NEURO: Awake but only oriented to self and the state.  Speech is difficult to understand.  She mumbles.  Follows some commands.  Seems to have some resting  tremor. PSYCH: Calm. Normal affect.   Procedures:  None  Microbiology summarized: Pleural fluid culture NGTD Urine culture with multiple species.  Assessment and plan: Principal Problem:   Generalized weakness Active Problems:   Lewy body dementia with behavioral disturbance (HCC)   Anxiety and depression   Dehydration   Recurrent falls   Multiple rib fractures   Pyuria   Elevated liver enzymes   DNR (do not resuscitate)   Goals of care, counseling/discussion   Malnutrition of moderate degree  Generalized weakness with recurrent falls: Multifactorial including dehydration, dementia, gait disturbance, and possible infection.  Some degree of contractures in the legs on my exam.  UA with pyuria but no fever or leukocytosis.  Patient cannot provide history.  Urine culture with multiple species. -Continue gentle hydration -PT/OT -Fall precaution  Closed fracture multiple ribs on the left side, POA: Noted on CT on 08/03/2022. -Supportive care with scheduled Tylenol  Elevated liver enzymes: Mild.  Bili and ALP within normal.  Due to rhabdo?  CK elevated and improving.  Acute hepatitis panel and HIV negative.  Improving. -Continue monitoring   Moderate left pleural effusion with left lower lobe atelectasis: Had thoracocentesis with removal of 350 cc dark, bloody fluid.  Likely hemothorax related to rib injury.  Fluid culture NGTD   Lewy body dementia without behavioral disturbance: She is oriented to self and place.  Follows some commands.  She mumbles and difficult to understand.  CT head, TSH and B12 without significant finding.  EEG with nonspecific encephalopathy but no seizure or epilepsy. -Reorientation and delirium precautions -Outpatient follow-up with neurology -Continue trial of IV fluid thiamine and IV antibiotics -Palliative consult for goals of care discussion -On dysphagia 1 diet per SLP   Stable staghorn right renal calculi Pyuria: Difficult to tell if she has UTI  or not.  And cannot provide history.  No fever or leukocytosis.  No suprapubic tenderness -Continue IV ceftriaxone pending culture  Hypokalemia: -Monitor replenish as appropriate  Goal of care counseling: DNR/DNI see discussion on 10/24. -Palliative consult for further goals of care discussion   Moderate malnutrition Body mass index is 21.28 kg/m. Nutrition Problem: Moderate Malnutrition Etiology: chronic illness (dementia) Signs/Symptoms: moderate fat depletion, moderate muscle depletion Interventions: Ensure Enlive (each supplement provides 350kcal and 20 grams of protein), MVI   DVT prophylaxis:  enoxaparin (LOVENOX) injection 40 mg Start: 08/07/22 2200  Code Status: Full code Family Communication: Updated patient's husband and daughter at bedside Level of care: Telemetry Status is: Inpatient Remains inpatient appropriate because: Generalized weakness, recurrent fall, encephalopathy   Final disposition: TBD Consultants:  Palliative medicine  Sch Meds:  Scheduled Meds:  acetaminophen  650 mg Oral Q6H WA   cholecalciferol  1,000 Units Oral Daily   donepezil  10 mg Oral QHS   enoxaparin (LOVENOX) injection  40 mg Subcutaneous Q24H   escitalopram  10 mg Oral QHS   feeding supplement  237 mL Oral BID BM   memantine  10 mg Oral BID   multivitamin with minerals  1 tablet Oral Daily   potassium chloride  40 mEq Oral Q4H   thiamine (VITAMIN B1) injection  100 mg Intravenous TID   Continuous Infusions:  sodium chloride Stopped (08/08/22 0649)   cefTRIAXone (ROCEPHIN)  IV Stopped (08/08/22 3570)   dextrose 5% lactated ringers with KCl 20 mEq/L 100 mL/hr at 08/08/22 0828   PRN Meds:.sodium chloride, acetaminophen, melatonin, polyethylene glycol, prochlorperazine  Antimicrobials: Anti-infectives (From admission, onward)    Start     Dose/Rate Route Frequency Ordered Stop   08/07/22 0630  cefTRIAXone (ROCEPHIN) 2 g in sodium chloride 0.9 % 100 mL IVPB        2 g 200  mL/hr over 30 Minutes Intravenous Every 24 hours 08/07/22 0608          I have personally reviewed the following labs and images: CBC: Recent Labs  Lab 08/03/22 1224 08/06/22 1618 08/07/22 0648 08/08/22 0342  WBC 9.1 8.8 8.7 8.7  NEUTROABS 6.7 6.6  --   --   HGB 11.6* 13.0 11.7* 13.0  HCT 34.8* 40.1 36.0 41.4  MCV 93.5 97.3 97.8 100.7*  PLT 236.0 301 284 298   BMP &GFR Recent Labs  Lab 08/03/22 1224 08/06/22 1618 08/07/22 0648 08/08/22 0554  NA 138 141 139 142  K 3.8 3.9 3.4* 3.2*  CL 101 107 109 110  CO2 26 24 22 24   GLUCOSE 98 115* 85 101*  BUN 27* 26* 23 18  CREATININE 0.87 0.90 0.78 0.57  CALCIUM 9.8 9.2 8.5* 8.5*  MG  --   --  2.2 2.3  PHOS  --   --  2.8 2.5   Estimated Creatinine Clearance: 49.2 mL/min (by C-G formula based on SCr of 0.57 mg/dL). Liver & Pancreas: Recent Labs  Lab 08/03/22 1224 08/06/22 1618 08/07/22 0648 08/08/22 0554  AST 77* 99* 83* 70*  ALT 140* 119* 99* 87*  ALKPHOS 76 65 58 60  BILITOT 0.9 1.0 1.1 0.9  PROT 7.5 7.3 6.5 6.6  ALBUMIN 4.2 3.9 3.4* 3.3*   Recent Labs  Lab 08/06/22 1618  LIPASE 37   Recent Labs  Lab 08/07/22 1846  AMMONIA 14   Diabetic: No results for input(s): "HGBA1C" in the last 72 hours. No results for input(s): "GLUCAP" in the last 168 hours. Cardiac Enzymes: Recent Labs  Lab 08/07/22 0652 08/08/22 0554  CKTOTAL 1,177* 688*   No results for input(s): "PROBNP" in the last 8760 hours. Coagulation Profile: No results for input(s): "INR", "PROTIME" in the last 168 hours. Thyroid Function Tests: Recent Labs    08/07/22 1846  TSH 1.031   Lipid Profile: No results for input(s): "CHOL", "HDL", "LDLCALC", "TRIG", "CHOLHDL", "LDLDIRECT" in the last 72 hours. Anemia Panel: Recent Labs    08/07/22 1825 08/07/22 1846  VITAMINB12  --  2,155*  FOLATE  --  28.2  FERRITIN  --  137  TIBC  --  288  IRON  --  41  RETICCTPCT 1.3  --    Urine analysis:    Component Value Date/Time   COLORURINE  YELLOW 08/07/2022 0517   APPEARANCEUR CLEAR 08/07/2022 0517   LABSPEC >1.046 (H) 08/07/2022 0517   PHURINE 5.0 08/07/2022 0517   GLUCOSEU NEGATIVE 08/07/2022 0517   HGBUR MODERATE (A) 08/07/2022 0517   BILIRUBINUR NEGATIVE 08/07/2022 0517   BILIRUBINUR neg 08/24/2021 1104   KETONESUR 20 (A) 08/07/2022 0517   PROTEINUR 30 (A) 08/07/2022 0517   UROBILINOGEN 0.2 08/24/2021 1104   NITRITE NEGATIVE 08/07/2022 0517   LEUKOCYTESUR LARGE (A) 08/07/2022 0517   Sepsis Labs: Invalid input(s): "PROCALCITONIN", "LACTICIDVEN"  Microbiology: Recent Results (from the past 240 hour(s))  Urine Culture     Status: None   Collection Time: 08/03/22 12:12 PM   Specimen: Urine  Result Value Ref Range Status   MICRO NUMBER: [id number]  Final   SPECIMEN QUALITY: Adequate  Final   Sample Source URINE, CLEAN CATCH  Final   STATUS: FINAL  Final   Result:   Final    Mixed genital flora isolated. These superficial bacteria are not indicative of a urinary tract infection. No further organism identification is warranted on this specimen. If clinically indicated, recollect clean-catch, mid-stream urine and transfer  immediately to Urine Culture Transport Tube.   Urine Culture     Status: Abnormal   Collection Time: 08/07/22  6:52 AM   Specimen: Urine, Clean Catch  Result Value Ref Range Status   Specimen Description   Final    URINE, CLEAN CATCH Performed at [facility], [address] [place], [place] [address]    Special Requests   Final    NONE Performed at [facility], [address] [place], [place] [address]    Culture MULTIPLE SPECIES PRESENT, SUGGEST RECOLLECTION (A)  Final   Report Status 08/08/2022 FINAL  Final  Body fluid culture w Gram Stain     Status: None (Preliminary result)   Collection Time: 08/07/22 11:30 AM   Specimen: PATH Cytology Peritoneal fluid  Result Value Ref Range Status   Specimen Description   Final    PERITONEAL Performed at  [facility], [address] [place], [place] [address]    Special Requests   Final    NONE Performed at [facility], [address], [place], [place] [address]    Gram Stain NO WBC SEEN NO ORGANISMS SEEN   Final   Culture  Final    NO GROWTH < 24 HOURS Performed at [facility], [place] [address], [place], [place] [address]    Report Status PENDING  Incomplete    Radiology Studies: EEG adult  Result Date: 2022-08-24 Lora Havens, MD     08/24/2022  2:04 PM Patient Name: BRENDALEE MATTHIES MRN: [id number] Epilepsy Attending: Lora Havens Referring Physician/Provider: Mercy Riding, MD Date: 2022-08-24 Duration: 24.01 mins Patient history: 81 year old female with altered mental status.  EEG to evaluate for seizure. Level of alertness: Awake AEDs during EEG study: None Technical aspects: This EEG study was done with scalp electrodes positioned according to the 10-20 International system of electrode placement. Electrical activity was reviewed with band pass filter of 1-70Hz , sensitivity of 7 uV/mm, display speed of 51mm/sec with a 60Hz  notched filter applied as appropriate. EEG data were recorded continuously and digitally stored.  Video monitoring was available and reviewed as appropriate. Description: The posterior dominant rhythm consists of 7.5Hz  activity of moderate voltage (25-35 uV) seen predominantly in posterior head regions, symmetric and reactive to eye opening and eye closing. EEG showed continuous generalized 5 to 7 Hz theta slowing. Hyperventilation and photic stimulation were not performed.   ABNORMALITY - Continuous slow, generalized IMPRESSION: This study is suggestive of mild diffuse encephalopathy, nonspecific etiology. No seizures or epileptiform discharges were seen throughout the recording. Priyanka Barbra Sarks   CT HEAD WO CONTRAST (5MM)  Result Date: 08/24/22 CLINICAL DATA:  Increased confusion EXAM: CT HEAD WITHOUT  CONTRAST TECHNIQUE: Contiguous axial images were obtained from the base of the skull through the vertex without intravenous contrast. RADIATION DOSE REDUCTION: This exam was performed according to the departmental dose-optimization program which includes automated exposure control, adjustment of the mA and/or kV according to patient size and/or use of iterative reconstruction technique. COMPARISON:  09/14/2021 FINDINGS: Brain: No evidence of acute infarction, hemorrhage, mass, mass effect, or midline shift. No hydrocephalus or extra-axial fluid collection. Age related cerebral atrophy. Vascular: No hyperdense vessel. Skull: Normal. Negative for fracture or focal lesion. Sinuses/Orbits: No acute finding. Other: The mastoid air cells are well aerated. IMPRESSION: No acute intracranial process. Electronically Signed   By: Merilyn Baba M.D.   On: 2022/08/24 01:16      Larissa Pegg T. [place]  If 7PM-7AM, please contact night-coverage [email or web address] 08/24/2022, 2:46 PM

## 2022-08-08 NOTE — Evaluation (Signed)
Clinical/Bedside Swallow Evaluation Patient Details  Name: EMILENE ROMA MRN: [id number] Date of Birth: 1941-05-29  Today's Date: 08/08/2022 Time: SLP Start Time (ACUTE ONLY): 0915 SLP Stop Time (ACUTE ONLY): 0935 SLP Time Calculation (min) (ACUTE ONLY): 20 min  Past Medical History:  Past Medical History:  Diagnosis Date   Arthritis    Compression fracture of first lumbar vertebra (HCC)    Dementia (HCC)    Iron deficiency anemia    Macular degeneration    Osteoporosis    Wears glasses    Past Surgical History:  Past Surgical History:  Procedure Laterality Date   BUNIONECTOMY WITH WEIL OSTEOTOMY Left 05/27/2014   Procedure: Left First Metatarsal Scarf Osteotomy, Second Metatarsal Weil, Modified Mcbride Bunionectomy, Hammertoe Correction;  Surgeon: Toni Arthurs, MD;  Location: [facility];  Service: Orthopedics;  Laterality: Left;   CARDIAC CATHETERIZATION     2012   COLONOSCOPY     EXTRACORPOREAL SHOCK WAVE LITHOTRIPSY Right 12/07/2021   Procedure: RIGHT EXTRACORPOREAL SHOCK WAVE LITHOTRIPSY (ESWL);  Surgeon: Despina Arias, MD;  Location: [facility];  Service: Urology;  Laterality: Right;   FOOT SURGERY  2015,2000   bilateral   GANGLION CYST EXCISION  1963   wrist-lt   KYPHOPLASTY N/A 07/16/2019   Procedure: LUMBAR 1 KYPHOPLASTY;  Surgeon: Venita Lick, MD;  Location: [facility];  Service: Orthopedics;  Laterality: N/A;  60 mins   TUBAL LIGATION     HPI:  Patient is a 81 y.o. female with PMH: advanced lewy body dementia, chronic anxiety/depression, macular degeneratino, arthritis, osteoporosis. She was admitted to the hospital on 08/06/2022 from home with generalized weakness and recurrent falls for past few days. She was  in the ED 08/03/22  with CXR showing fracture of ribs (left) 6, 7 and possibly 8. She was prescribed tramadol by her PCP but then was becoming progressively weaker to the point where she could not stand on her own so PCP stopped  tramadol. She reportedly had c/o abdominal pain at home with poor oral intake. In ED 10/23, patient was afebrile,  imaging showed 4 left rib fractures with moderate left pleural effusion. She was made NPO by MD awaiting SLP evaluation of swallow function.    Assessment / Plan / Recommendation  Clinical Impression  Patient presenting with what appears to be a cognitive-based dysphagia as per this bedside/clinical swallow evaluation. She was awake and alert when SLP entered the room but does appear somewhat drowsy. She was able to maintain adequate alertness and active participation in PO intake with trials of thin liquids (water) and puree solids (applesauce). Patient tolerated puree solids with SLP feeding, without overt s/s difficulty during oral or pharyngeal phase. With thin liquids, patient was impulsive and would chug/gulp rapidly from straw. Swallow appeared timely but SLP did observe patient to have multiple (not more than two) swallows. After first several sips, she did exhibit a dry sounding cough response, however subsequent sips did not result in any overt s/s aspiration or penetration. SLP is recommending to initiate PO diet of Dys 1 (puree) solids and thin liquids and SLP will follow patient for diet toleration and ability to upgrade solids. SLP Visit Diagnosis: Dysphagia, unspecified (R13.10)      Aspiration Risk  Mild aspiration risk    Diet Recommendation Dysphagia 1 (Puree);Thin liquid   Liquid Administration via: Cup;Straw Medication Administration: Crushed with puree Supervision: Full supervision/cueing for compensatory strategies;Staff to assist with self feeding Compensations: Small sips/bites;Slow rate;Minimize environmental distractions Postural Changes:  Seated upright at 90 degrees    Other  Recommendations Oral Care Recommendations: Oral care BID;Staff/trained caregiver to provide oral care;Other (Comment) (oral care after PO intake)    Recommendations for follow up  therapy are one component of a multi-disciplinary discharge planning process, led by the attending physician.  Recommendations may be updated based on patient status, additional functional criteria and insurance authorization.  Follow up Recommendations Follow physician's recommendations for discharge plan and follow up therapies      Assistance Recommended at Discharge Frequent or constant Supervision/Assistance  Functional Status Assessment Patient has had a recent decline in their functional status and demonstrates the ability to make significant improvements in function in a reasonable and predictable amount of time.  Frequency and Duration min 2x/week  1 week       Prognosis Prognosis for Safe Diet Advancement: Fair Barriers to Reach Goals: Cognitive deficits;Severity of deficits Barriers/Prognosis Comment: patient with advanced lewy body dementia      Swallow Study   General Date of Onset: 08/06/22 HPI: Patient is a 81 y.o. female with PMH: advanced lewy body dementia, chronic anxiety/depression, macular degeneratino, arthritis, osteoporosis. She was admitted to the hospital on 08/06/2022 from home with generalized weakness and recurrent falls for past few days. She was  in the ED 08/03/22  with CXR showing fracture of ribs (left) 6, 7 and possibly 8. She was prescribed tramadol by her PCP but then was becoming progressively weaker to the point where she could not stand on her own so PCP stopped tramadol. She reportedly had c/o abdominal pain at home with poor oral intake. In ED 10/23, patient was afebrile,  imaging showed 4 left rib fractures with moderate left pleural effusion. She was made NPO by MD awaiting SLP evaluation of swallow function. Type of Study: Bedside Swallow Evaluation Previous Swallow Assessment: none found Diet Prior to this Study: NPO Temperature Spikes Noted: No Respiratory Status: Room air History of Recent Intubation: No Behavior/Cognition:  Alert;Cooperative;Requires cueing;Pleasant mood Oral Cavity Assessment: Within Functional Limits Oral Care Completed by SLP: Yes Oral Cavity - Dentition: Adequate natural dentition Self-Feeding Abilities: Total assist Patient Positioning: Upright in bed Baseline Vocal Quality: Low vocal intensity Volitional Cough: Cognitively unable to elicit Volitional Swallow: Able to elicit    Oral/Motor/Sensory Function Overall Oral Motor/Sensory Function: Within functional limits   Ice Chips     Thin Liquid Thin Liquid: Impaired Presentation: Straw Pharyngeal  Phase Impairments: Multiple swallows Other Comments: Patient impulsive drinking through straw. She exhibited mildly delayed cough after initial few sips of water but no further overt s/s aspiration or penetration with subsequent liquid intake    Nectar Thick     Honey Thick     Puree Puree: Within functional limits Presentation: Spoon   Solid     Solid: Not tested     Sonia Baller, MA, CCC-SLP Speech Therapy

## 2022-08-08 NOTE — TOC Initial Note (Signed)
Transition of Care [facility]) - Initial/Assessment Note    Patient Details  Name: Julia Palmer MRN: [id number] Date of Birth: 1941/01/05  Transition of Care [facility]) CM/SW Contact:    Leeroy Cha, RN Phone Number: 08/08/2022, 8:30 AM  Clinical Narrative:                  Transition of Care [facility]) Screening Note   Patient Details  Name: Julia Palmer Date of Birth: 07-04-1941   Transition of Care [facility]) CM/SW Contact:    Leeroy Cha, RN Phone Number: 08/08/2022, 8:30 AM    Transition of Care Department [facility]) has reviewed patient and no TOC needs have been identified at this time. We will continue to monitor patient advancement through interdisciplinary progression rounds. If new patient transition needs arise, please place a TOC consult.    Expected Discharge Plan: Home/Self Care Barriers to Discharge: Continued Medical Work up   Patient Goals and CMS Choice Patient states their goals for this hospitalization and ongoing recovery are:: to go home CMS Medicare.gov Compare Post Acute Care list provided to:: Patient    Expected Discharge Plan and Services Expected Discharge Plan: Home/Self Care   Discharge Planning Services: CM Consult   Living arrangements for the past 2 months: Single Family Home                                      Prior Living Arrangements/Services Living arrangements for the past 2 months: Single Family Home Lives with:: Spouse Patient language and need for interpreter reviewed:: Yes Do you feel safe going back to the place where you live?: Yes            Criminal Activity/Legal Involvement Pertinent to Current Situation/Hospitalization: No - Comment as needed  Activities of Daily Living Home Assistive Devices/Equipment: Environmental consultant (specify type), Wheelchair, Radio producer (specify quad or straight) ADL Screening (condition at time of admission) Patient's cognitive ability adequate to safely complete daily activities?: No Is  the patient deaf or have difficulty hearing?: No Does the patient have difficulty seeing, even when wearing glasses/contacts?: Yes Does the patient have difficulty concentrating, remembering, or making decisions?: Yes Patient able to express need for assistance with ADLs?: No Does the patient have difficulty dressing or bathing?: Yes Independently performs ADLs?: No Communication: Needs assistance Is this a change from baseline?: Pre-admission baseline Dressing (OT): Needs assistance Is this a change from baseline?: Pre-admission baseline Grooming: Needs assistance Is this a change from baseline?: Pre-admission baseline Feeding: Needs assistance Is this a change from baseline?: Pre-admission baseline Bathing: Needs assistance Is this a change from baseline?: Pre-admission baseline Toileting: Needs assistance Is this a change from baseline?: Pre-admission baseline In/Out Bed: Needs assistance Is this a change from baseline?: Pre-admission baseline Walks in Home: Dependent Is this a change from baseline?: Pre-admission baseline Does the patient have difficulty walking or climbing stairs?: Yes Weakness of Legs: Both Weakness of Arms/Hands: Both  Permission Sought/Granted                  Emotional Assessment Appearance:: Appears stated age Attitude/Demeanor/Rapport: Engaged Affect (typically observed): Calm Orientation: : Oriented to Self, Oriented to Place, Oriented to  Time, Oriented to Situation Alcohol / Substance Use: Never Used Psych Involvement: No (comment)  Admission diagnosis:  Weakness [R53.1] Pleural effusion, left [J90] Generalized weakness [R53.1] Closed fracture of multiple ribs of left side, initial encounter [S22.42XA] Lewy body  dementia, unspecified dementia severity, unspecified whether behavioral, psychotic, or mood disturbance or anxiety ([place]) [G31.83, F02.80] Patient Active Problem List   Diagnosis Date Noted   Anxiety and depression 08/07/2022    Dehydration 08/07/2022   Recurrent falls 08/07/2022   Multiple rib fractures 08/07/2022   Pyuria 08/07/2022   Elevated liver enzymes 08/07/2022   DNR (do not resuscitate)    Goals of care, counseling/discussion    Generalized weakness 08/06/2022   Aortic atherosclerosis ([place]) 07/05/2022   Lewy body dementia with behavioral disturbance ([place]) 05/24/2022   Lumbar stress fracture 03/13/2019   Mild dementia ([place]) 09/24/2018   Hyperlipidemia 08/06/2017   Mid back pain 08/06/2017   LFT elevation 08/06/2017   Macular degeneration 11/15/2014   Raynaud phenomenon 08/24/2011   Varicose veins of lower extremities with inflammation 08/21/2010   Chest pain 01/12/2008   Anemia 09/29/2007   Osteoporosis 09/29/2007   Edema 09/29/2007   PCP:  Marin Olp, MD Pharmacy:   [facility], [place] [address] Phone: [phone] Fax: [place], [place] - [address] AT [place] [address] [place] [address] Phone: [phone] Fax: [place], [address] AT [facility] [place] [address] [place] [address] Phone: [phone] Fax: [phone]     Social Determinants of Health (SDOH) Interventions    Readmission Risk Interventions   No data to display

## 2022-08-08 NOTE — Progress Notes (Signed)
Initial Nutrition Assessment  DOCUMENTATION CODES:   Non-severe (moderate) malnutrition in context of chronic illness  INTERVENTION:   -Ensure Plus High Protein po BID, each supplement provides 350 kcal and 20 grams of protein.   -Multivitamin with minerals daily  NUTRITION DIAGNOSIS:   Moderate Malnutrition related to chronic illness (dementia) as evidenced by moderate fat depletion, moderate muscle depletion.  GOAL:   Patient will meet greater than or equal to 90% of their needs  MONITOR:   PO intake, Supplement acceptance, Labs, Weight trends, I & O's  REASON FOR ASSESSMENT:   Malnutrition Screening Tool    ASSESSMENT:   81 year old F with PMH of Lewy body dementia, anxiety, depression, bilateral rib fractures and recurrent falls presenting with generalized weakness and recurrent fall, and admitted for dehydration, generalized weakness, elevated liver enzymes and pleural effusion.  Patient in room, no family present at bedside. Pt with dementia. Pt at times difficult to understand and soft spoken. Pt was able to answer yes or no to questions but could not elaborate without losing track of thought. Pt seen by SLP today and recommended dysphagia 1 diet with thin liquids. Pt requires feeding assistance.  Will order Ensure supplements and daily MVI. Noted MD is checking thiamine labs. And has started pt on IV thiamine.  Per chart review, pt's family reports 15 lbs of weight loss. Per weight records, pt with weight loss beginning in June 2022.   Medications: Vitamin D, KLOR-CON, Thiamine, D5 infusion, KCl  Labs reviewed: Low K   NUTRITION - FOCUSED PHYSICAL EXAM:  Flowsheet Row Most Recent Value  Orbital Region Moderate depletion  Upper Arm Region Severe depletion  Thoracic and Lumbar Region Unable to assess  Buccal Region Moderate depletion  Temple Region Moderate depletion  Clavicle Bone Region Severe depletion  Clavicle and Acromion Bone Region Moderate  depletion  Scapular Bone Region Moderate depletion  Dorsal Hand Unable to assess  [mitts]  Patellar Region Moderate depletion  Anterior Thigh Region Moderate depletion  Posterior Calf Region Moderate depletion  Edema (RD Assessment) None  Hair Reviewed  Eyes Reviewed  Mouth Reviewed  Skin Reviewed       Diet Order:   Diet Order             DIET - DYS 1 Room service appropriate? Yes; Fluid consistency: Thin  Diet effective now                   EDUCATION NEEDS:   Not appropriate for education at this time  Skin:  Skin Assessment: Reviewed RN Assessment  Last BM:  PTA  Height:   Ht Readings from Last 1 Encounters:  08/06/22 5\' 4"  (1.626 m)    Weight:   Wt Readings from Last 1 Encounters:  08/06/22 56.2 kg    BMI:  Body mass index is 21.28 kg/m.  Estimated Nutritional Needs:   Kcal:  1400-1600  Protein:  70-80g  Fluid:  1.6L/day  Clayton Bibles, MS, RD, LDN Inpatient Clinical Dietitian Contact information available via Amion

## 2022-08-09 DIAGNOSIS — Z515 Encounter for palliative care: Secondary | ICD-10-CM

## 2022-08-09 DIAGNOSIS — E860 Dehydration: Secondary | ICD-10-CM | POA: Diagnosis not present

## 2022-08-09 DIAGNOSIS — R531 Weakness: Secondary | ICD-10-CM | POA: Diagnosis not present

## 2022-08-09 DIAGNOSIS — F419 Anxiety disorder, unspecified: Secondary | ICD-10-CM | POA: Diagnosis not present

## 2022-08-09 DIAGNOSIS — R748 Abnormal levels of other serum enzymes: Secondary | ICD-10-CM | POA: Diagnosis not present

## 2022-08-09 MED ORDER — LIP MEDEX EX OINT
TOPICAL_OINTMENT | CUTANEOUS | Status: DC | PRN
  Filled 2022-08-09: qty 7

## 2022-08-09 MED ORDER — HYDROMORPHONE HCL 1 MG/ML IJ SOLN: 0.5000 mg | INTRAMUSCULAR | Status: DC | PRN

## 2022-08-09 MED ORDER — HYDROMORPHONE HCL 1 MG/ML IJ SOLN
0.5000 mg | INTRAMUSCULAR | Status: DC | PRN
  Administered 2022-08-10 (×2): 0.5 mg via INTRAVENOUS
  Administered 2022-08-11 (×3): 1 mg via INTRAVENOUS
  Administered 2022-08-11: 0.5 mg via INTRAVENOUS
  Administered 2022-08-11 – 2022-08-12 (×2): 1 mg via INTRAVENOUS
  Filled 2022-08-09 (×9): qty 1

## 2022-08-09 NOTE — Progress Notes (Signed)
PROGRESS NOTE  Julia Palmer [id number] DOB: 06-08-1941   PCP: Shelva Majestic, MD  Patient is from: Home.  Lives with husband.  DOA: 08/06/2022 LOS: 3  Chief complaints Chief Complaint  Patient presents with   Weakness   Abdominal Pain     Brief Narrative / Interim history: 81 year old F with PMH of Lewy body dementia, anxiety, depression, bilateral rib fractures and recurrent falls presenting with generalized weakness and recurrent fall, and admitted for dehydration, generalized weakness, elevated liver enzymes and pleural effusion.  She had pyuria on UA and started on IV ceftriaxone.  Urine culture with multiple species.  Patient underwent thoracocentesis for pleural effusion.  Fluid culture NGTD.  Remains confused and altered.  Encephalopathy work-up unrevealing.  Palliative medicine consulted on 08/08/2022, and patient was transitioned to full comfort care.  Subjective: Seen and examined earlier this morning.  No major events overnight of this morning.  Sleepy and calm except for intermittent jerking movements.  No apparent distress.  Objective: Vitals:   08/08/22 0417 08/09/22 0124 08/09/22 0537 08/09/22 0700  BP: (!) 155/113 95/61 (!) 149/95 (!) 137/104  Pulse: 100 73 82 80  Resp: 20 16 18 18   Temp: 98.3 F (36.8 C) 97.8 F (36.6 C) 98.9 F (37.2 C) 97.8 F (36.6 C)  TempSrc: Oral Oral Oral Oral  SpO2: 96% 99% 97% 93%  Weight:      Height:        Examination:  GENERAL: No apparent distress.  RESP: On room air.  No IWOB.  CVS:  RRR. Heart sounds normal.  NEURO: Sleepy.  Intermittent jerking movements in upper extremities PSYCH: Calm.  Distress or agitation  Procedures:  None  Microbiology summarized: Pleural fluid culture NGTD Urine culture with multiple species.  Assessment and plan: Principal Problem:   Generalized weakness Active Problems:   Lewy body dementia with behavioral disturbance (HCC)   Anxiety and depression   Dehydration    Recurrent falls   Multiple rib fractures   Pyuria   Elevated liver enzymes   DNR (do not resuscitate)   Goals of care, counseling/discussion   Malnutrition of moderate degree   Pleural effusion, left   High risk medication use   Agitation   Pain   Lewy body dementia (HCC)   Coordination of complex care   Palliative care encounter  End-of-life care: Appears comfortable except for intermittent jerking although this is not new..  -Comfort care pathway with as needed medication -Appreciate help by palliative medicine -Changed morphine to Dilaudid given intermittent jerking -Stable for transfer to residential hospice but I let palliative discuss with family first.   Generalized weakness with recurrent falls: Multifactorial including dehydration, dementia, gait disturbance, and possible infection.  Some degree of contractures in the legs on my exam.    Closed fracture multiple ribs on the left side, POA: Noted on CT on 08/03/2022.  Elevated liver enzymes: Mild.  Bili and ALP within normal.  Due to rhabdo?  CK elevated and improving.  Acute hepatitis panel and HIV negative.    Moderate left pleural effusion with left lower lobe atelectasis: Had thoracocentesis with removal of 350 cc dark, bloody fluid.  Likely hemothorax related to rib injury.  Fluid culture NGTD   Lewy body dementia without behavioral disturbance: She is oriented to self and place.  Follows some commands.  She mumbles and difficult to understand.  CT head, TSH and B12 without significant finding.  EEG with nonspecific encephalopathy but no seizure or epilepsy.  Stable staghorn right renal calculi Pyuria: Difficult to tell if she has UTI or not.  And cannot provide history.  No fever or leukocytosis.  No suprapubic tenderness.  Received IV ceftriaxone for 3 days  Hypokalemia:  Goal of care counseling: DNR/DNI see discussion on 10/24.  Full comfort care on 10/25   Moderate malnutrition Body mass index is 21.28  kg/m. Nutrition Problem: Moderate Malnutrition Etiology: chronic illness (dementia) Signs/Symptoms: moderate fat depletion, moderate muscle depletion Interventions: Ensure Enlive (each supplement provides 350kcal and 20 grams of protein), MVI   DVT prophylaxis:  Patient is full comfort care  Code Status: Full code Family Communication: None at bedside Level of care: Med-Surg Status is: Inpatient Remains inpatient appropriate because: End-of-life care   Final disposition: Residential hospice Consultants:  Palliative medicine  Sch Meds:  Scheduled Meds:   Continuous Infusions:   PRN Meds:.acetaminophen, glycopyrrolate, HYDROmorphone (DILAUDID) injection, lip balm, LORazepam, polyvinyl alcohol, prochlorperazine  Antimicrobials: Anti-infectives (From admission, onward)    Start     Dose/Rate Route Frequency Ordered Stop   08/07/22 0630  cefTRIAXone (ROCEPHIN) 2 g in sodium chloride 0.9 % 100 mL IVPB  Status:  Discontinued        2 g 200 mL/hr over 30 Minutes Intravenous Every 24 hours 08/07/22 0608 08/08/22 1619        I have personally reviewed the following labs and images: CBC: Recent Labs  Lab 08/03/22 1224 08/06/22 1618 08/07/22 0648 08/08/22 0342  WBC 9.1 8.8 8.7 8.7  NEUTROABS 6.7 6.6  --   --   HGB 11.6* 13.0 11.7* 13.0  HCT 34.8* 40.1 36.0 41.4  MCV 93.5 97.3 97.8 100.7*  PLT 236.0 301 284 298   BMP &GFR Recent Labs  Lab 08/03/22 1224 08/06/22 1618 08/07/22 0648 08/08/22 0554  NA 138 141 139 142  K 3.8 3.9 3.4* 3.2*  CL 101 107 109 110  CO2 26 24 22 24   GLUCOSE 98 115* 85 101*  BUN 27* 26* 23 18  CREATININE 0.87 0.90 0.78 0.57  CALCIUM 9.8 9.2 8.5* 8.5*  MG  --   --  2.2 2.3  PHOS  --   --  2.8 2.5   Estimated Creatinine Clearance: 49.2 mL/min (by C-G formula based on SCr of 0.57 mg/dL). Liver & Pancreas: Recent Labs  Lab 08/03/22 1224 08/06/22 1618 08/07/22 0648 08/08/22 0554  AST 77* 99* 83* 70*  ALT 140* 119* 99* 87*   ALKPHOS 76 65 58 60  BILITOT 0.9 1.0 1.1 0.9  PROT 7.5 7.3 6.5 6.6  ALBUMIN 4.2 3.9 3.4* 3.3*   Recent Labs  Lab 08/06/22 1618  LIPASE 37   Recent Labs  Lab 08/07/22 1846  AMMONIA 14   Diabetic: No results for input(s): "HGBA1C" in the last 72 hours. No results for input(s): "GLUCAP" in the last 168 hours. Cardiac Enzymes: Recent Labs  Lab 08/07/22 0652 08/08/22 0554  CKTOTAL 1,177* 688*   No results for input(s): "PROBNP" in the last 8760 hours. Coagulation Profile: No results for input(s): "INR", "PROTIME" in the last 168 hours. Thyroid Function Tests: Recent Labs    08/07/22 1846  TSH 1.031   Lipid Profile: No results for input(s): "CHOL", "HDL", "LDLCALC", "TRIG", "CHOLHDL", "LDLDIRECT" in the last 72 hours. Anemia Panel: Recent Labs    08/07/22 1825 08/07/22 1846  VITAMINB12  --  2,155*  FOLATE  --  28.2  FERRITIN  --  137  TIBC  --  288  IRON  --  41  RETICCTPCT 1.3  --    Urine analysis:    Component Value Date/Time   COLORURINE YELLOW 08/07/2022 0517   APPEARANCEUR CLEAR 08/07/2022 0517   LABSPEC >1.046 (H) 08/07/2022 0517   PHURINE 5.0 08/07/2022 0517   GLUCOSEU NEGATIVE 08/07/2022 0517   HGBUR MODERATE (A) 08/07/2022 0517   BILIRUBINUR NEGATIVE 08/07/2022 0517   BILIRUBINUR neg 08/24/2021 1104   KETONESUR 20 (A) 08/07/2022 0517   PROTEINUR 30 (A) 08/07/2022 0517   UROBILINOGEN 0.2 08/24/2021 1104   NITRITE NEGATIVE 08/07/2022 0517   LEUKOCYTESUR LARGE (A) 08/07/2022 0517   Sepsis Labs: Invalid input(s): "PROCALCITONIN", "LACTICIDVEN"  Microbiology: Recent Results (from the past 240 hour(s))  Urine Culture     Status: None   Collection Time: 08/03/22 12:12 PM   Specimen: Urine  Result Value Ref Range Status   MICRO NUMBER: [id number]  Final   SPECIMEN QUALITY: Adequate  Final   Sample Source URINE, CLEAN CATCH  Final   STATUS: FINAL  Final   Result:   Final    Mixed genital flora isolated. These superficial bacteria are not  indicative of a urinary tract infection. No further organism identification is warranted on this specimen. If clinically indicated, recollect clean-catch, mid-stream urine and transfer  immediately to Urine Culture Transport Tube.   Urine Culture     Status: Abnormal   Collection Time: 08/07/22  6:52 AM   Specimen: Urine, Clean Catch  Result Value Ref Range Status   Specimen Description   Final    URINE, CLEAN CATCH Performed at [facility], [address] [place], [place] [address]    Special Requests   Final    NONE Performed at [facility], [address], [place], [place] [address]    Culture MULTIPLE SPECIES PRESENT, SUGGEST RECOLLECTION (A)  Final   Report Status 08/12/2022 FINAL  Final  Body fluid culture w Gram Stain     Status: None (Preliminary result)   Collection Time: 08/07/22 11:30 AM   Specimen: PATH Cytology Peritoneal fluid  Result Value Ref Range Status   Specimen Description   Final    PERITONEAL Performed at [facility], [address], [place], [place] [address]    Special Requests   Final    NONE Performed at [facility], [address] [place], [place] [address]    Gram Stain NO WBC SEEN NO ORGANISMS SEEN   Final   Culture   Final    NO GROWTH 2 DAYS Performed at [facility], [address] [place], [place] [address]    Report Status PENDING  Incomplete    Radiology Studies: EEG adult  Result Date: 08-12-2022 Charlsie Quest, MD     2022-08-12  2:04 PM Patient Name: Julia Palmer MRN: [id number] Epilepsy Attending: Charlsie Quest Referring Physician/Provider: Almon Hercules, MD Date: 12-Aug-2022 Duration: 24.01 mins Patient history: 81 year old female with altered mental status.  EEG to evaluate for seizure. Level of alertness: Awake AEDs during EEG study: None Technical aspects: This EEG study was done with scalp electrodes positioned according to the 10-20  International system of electrode placement. Electrical activity was reviewed with band pass filter of 1-70Hz , sensitivity of 7 uV/mm, display speed of 39mm/sec with a 60Hz  notched filter applied as appropriate. EEG data were recorded continuously and digitally stored.  Video monitoring was available and reviewed as appropriate. Description: The posterior dominant rhythm consists of 7.5Hz  activity of moderate voltage (25-35 uV)  seen predominantly in posterior head regions, symmetric and reactive to eye opening and eye closing. EEG showed continuous generalized 5 to 7 Hz theta slowing. Hyperventilation and photic stimulation were not performed.   ABNORMALITY - Continuous slow, generalized IMPRESSION: This study is suggestive of mild diffuse encephalopathy, nonspecific etiology. No seizures or epileptiform discharges were seen throughout the recording. Priyanka Barbra Sarks      Elisandro Jarrett T. [place]  If 7PM-7AM, please contact night-coverage [email or web address] 08/09/2022, 1:21 PM

## 2022-08-09 NOTE — Progress Notes (Signed)
Manufacturing engineer [facility]) Hospital Liaison Note  Received request from [employer] [place] for family interest in [facility]. Visited patient at bedside and spoke with spouse/James & daughter/Stephanie to confirm interest and explain services.  Approval for [employer] is determined by [facility] MD. Once [facility] MD has determined [facility] eligibility, [facility] will update hospital staff and family.  Please do not hesitate to call with any hospice related questions.    Thank you for the opportunity to participate in this patient's care.  Daphene Calamity, MSW [facility] Liaison  [phone]

## 2022-08-09 NOTE — Progress Notes (Signed)
Spiritual Care consult to provide support as needed for family. Family members bedside, no particular needs expressed. Asked staff to order a comfort cart.

## 2022-08-09 NOTE — Progress Notes (Signed)
Daily Progress Note   Patient Name: Julia Palmer       Date: 08/09/2022 DOB: 06-18-1941  Age: 81 y.o. MRN#: [id number] Attending Physician: Mercy Riding, MD Primary Care Physician: Marin Olp, MD Admit Date: 08/06/2022 Length of Stay: 3 days  Reason for Consultation/Follow-up: Establishing goals of care and symptom management  Subjective:   CC: Patient unable to participate in conversation due to her underlying medical status.  Discussion held with patient's husband and daughter at bedside.   Subjective:  Discussed care with patient's bedside RN in a.m.  Patient's care remains comfort care focused.  At time of EMR review patient had received morphine 1 mg x 1, Robinul 0.2 mg IV x1, and Ativan IV 0.5 mg x 3 doses.  Had planned for family meeting at 1 PM today to follow-up with patient's husband and daughter regarding support for medical care moving forward.  When presenting to bedside for meeting, patient laying calmly in bed at this time.  Patient out of all restraints.  Will note some slight grimacing at times though overall appears much more comfortable than she did yesterday.  Husband and daughter asked to speak outside of the room as to not further agitate patient.  Were able to meet in conference room and further discuss patient's symptom management and plans for medical management moving forward.  Further discussed the idea of hospice evaluation.  Discussed process of consult to case manager to determine choosing a hospice and then have hospice liaison evaluate patient to determine best setting of care for patient.  Had discussed the idea of home with hospice versus inpatient hospice facility.  At this time patient requiring IV medications for symptom management so discussed patient may qualify for inpatient GIP criteria at a facility for hospice though would defer that decision to the hospice liaison and medical director.  Husband inquiring about organ donation for patient.   Noted normally organ procurement agencies discussed that care with patient so would pass message on to case manager and staff who could provide information to family regarding this.  Spent time offering emotional support and normalizing feelings of grief.  Family able to spend time reminiscing about wonderful memories regarding patient.  All questions answered at that time.  Thanked family for allowing [place] to discuss patient's care again today.  Review of Systems Unable to obtain ROS due to patient's mentation.  Objective:   Vital Signs:  BP (!) 137/104 (BP Location: Left Arm)   Pulse 80   Temp 97.8 F (36.6 C) (Oral)   Resp 18   Ht 5\' 4"  (1.626 m)   Wt 56.2 kg   SpO2 93%   BMI 21.28 kg/m   Physical Exam: General: Laying in bed, not in restraints, calm appearing though slight grimacing noted at times Eyes: No drainage noted HENT: Dry mucous membranes Cardiovascular: RRR Respiratory: no increase work of breathing noted, not in respiratory distress Abdomen: not distended Extremities: no edema in LE b/l Skin: no rashes or lesions on visible skin Neuro: Sleeping  Assessment & Plan:   Assessment: Patient is a 81 year old female with a past medical history of Lewy body dementia, anxiety, depression, bilateral rib fractures, and recurrent falls who was admitted on 10/23 for generalized weakness and dehydration.  During hospitalization patient underwent a thoracentesis for pleural effusion.  Patient has undergone extensive work-up for encephalopathy, in the setting of Lewy body dementia, which has been unrevealing and show no reversible size of worsening confusion.  Palliative care consulted  to assist with complex medical decision-making.   Recommendations/Plan: # Complex medical decision making/goals of care:   -Patient unable to participate in conversation due to underlying dementia.               Spoke with patient's decision maker/husband and only child/daughter as described above  in HPI.  Again, based on patient's continued deterioration with description from family, patient is now at least a FAST score 7D in terms of her dementia (Patient has had increased lethargy, decreased oral intake, increased agitation, inability to communicate, inability to ambulate, inability to even hold herself up without assistance).   -Patient was already transitioned to comfort care focus on 10/25.  Discuss care further with family today and will place [facility] referral to assist with hospice discussions. Patient is requiring IV medications for agitation and pain management at this time and so may meet criteria for GIP hospice criteria for acceptance to a facility. Appreciate hospice liaison's evaluation and recommendations.           -  Code Status: DNR  Prognosis: Days to weeks                               -Discussed prognosis with family again today at their request.  With patient's inability to eat or drink, time will likely be short in terms of days to short weeks.   # Symptom management                -Pain, nonverbal and generalized in the setting of end-of-life care                               -Adjusted hydromorphone IV to 0.5-1 mg every hour as needed pain or dyspnea.  Continue to adjust based on patient's symptom burden.                  -Anxiety/agitation, currently severe in the setting of end-of-life care                               -Continue IV Ativan 0.5 mg every 4 hours as needed.  Continue to adjust based on patient's symptom burden.  Avoiding Haldol with patient's underlying Lewy body dementia diagnosis.  # Psychosocial Support: -Support System: Patient's husband and daughter -Informed chaplain of patient's family's presence at bedside today.  # Discharge Planning:   -Patient requiring IV medications for symptom management at this time.  Placed TOC referral for hospice evaluation to determine discharge home versus inpatient hospice facility.  Discussed with: Family,  chaplain, RN, hospitalist, case management  Thank you for allowing the palliative care team to participate in the care Genevie Cheshire.  Alvester Morin, DO Palliative Care Provider PMT # [phone]  If patient remains symptomatic despite maximum doses, please call PMT at [phone] between 0700 and 1900. Outside of these hours, please call attending, as PMT does not have night coverage.

## 2022-08-09 NOTE — Care Management Important Message (Signed)
Important Message  Patient Details IM Letter placed in Patients room. Name: Julia Palmer MRN: [id number] Date of Birth: 1941/07/10   Medicare Important Message Given:  Yes     Kerin Salen 08/09/2022, 11:16 AM

## 2022-08-09 NOTE — TOC Progression Note (Signed)
Transition of Care [facility]) - Progression Note    Patient Details  Name: Julia Palmer MRN: [id number] Date of Birth: 28-Jan-1941  Transition of Care [facility]) CM/SW Contact  Leeroy Cha, RN Phone Number: 08/09/2022, 3:27 PM  Clinical Narrative:    Spoke with family they prefer pqatient to go to [facility].  Thersa Salt with [employer] included in on chat per her request will see the family.   Expected Discharge Plan: Home/Self Care Barriers to Discharge: Continued Medical Work up  Expected Discharge Plan and Services Expected Discharge Plan: Home/Self Care   Discharge Planning Services: CM Consult   Living arrangements for the past 2 months: Single Family Home                                       Social Determinants of Health (SDOH) Interventions    Readmission Risk Interventions   No data to display

## 2022-08-10 DIAGNOSIS — F0280 Dementia in other diseases classified elsewhere without behavioral disturbance: Secondary | ICD-10-CM

## 2022-08-10 DIAGNOSIS — G3183 Dementia with Lewy bodies: Secondary | ICD-10-CM | POA: Diagnosis not present

## 2022-08-10 DIAGNOSIS — Z7189 Other specified counseling: Secondary | ICD-10-CM | POA: Diagnosis not present

## 2022-08-10 DIAGNOSIS — R52 Pain, unspecified: Secondary | ICD-10-CM

## 2022-08-10 DIAGNOSIS — Z79899 Other long term (current) drug therapy: Secondary | ICD-10-CM

## 2022-08-10 DIAGNOSIS — Z515 Encounter for palliative care: Secondary | ICD-10-CM | POA: Diagnosis not present

## 2022-08-10 DIAGNOSIS — R451 Restlessness and agitation: Secondary | ICD-10-CM | POA: Diagnosis not present

## 2022-08-10 LAB — BODY FLUID CULTURE W GRAM STAIN
Culture: NO GROWTH
Gram Stain: NONE SEEN

## 2022-08-10 MED ORDER — LORAZEPAM 2 MG/ML IJ SOLN: 0.5000 mg | INTRAMUSCULAR | 0 refills | Status: DC | PRN

## 2022-08-10 MED ORDER — POLYVINYL ALCOHOL 1.4 % OP SOLN: 1.0000 [drp] | Freq: Four times a day (QID) | OPHTHALMIC | 0 refills | Status: AC | PRN

## 2022-08-10 MED ORDER — HYDROMORPHONE HCL 1 MG/ML IJ SOLN: 0.5000 mg | INTRAMUSCULAR | 0 refills | Status: AC | PRN

## 2022-08-10 MED ORDER — GLYCOPYRROLATE 0.2 MG/ML IJ SOLN: 0.2000 mg | INTRAMUSCULAR | Status: AC | PRN

## 2022-08-10 MED ORDER — PROCHLORPERAZINE EDISYLATE 10 MG/2ML IJ SOLN: 5.0000 mg | Freq: Four times a day (QID) | INTRAMUSCULAR | Status: AC | PRN

## 2022-08-10 MED ORDER — ACETAMINOPHEN 650 MG RE SUPP: 650.0000 mg | Freq: Four times a day (QID) | RECTAL | 0 refills | Status: AC | PRN

## 2022-08-10 NOTE — Progress Notes (Addendum)
Manufacturing engineer [facility]) Hospital Liaison Note  Addendum:  Discussed with bedside RN, MD, and PMT MD about PRN recommendations due to patients symptoms during visit and this liaison would follow up on status of patient to see if symptoms were managed. After reassessing patient once PRN medications given, patient was much more comfortable without accessory muscle use and decreased respirations. Plan to reassess in morning for bed.    Received request from [facility] [place] for family interest in [facility]. Visited patient at bedside and spoke with daughter/Stephanie to confirm interest and explain services.   [facility] are unable to offer a bed today. [place] liaison will be in touch once a bed becomes available.   Please do not hesitate to call with any hospice related questions.    Thank you for the opportunity to participate in this patient's care.   Clementeen Hoof, DNP, RN [facility] Liaison  [phone]

## 2022-08-10 NOTE — Progress Notes (Signed)
PROGRESS NOTE  Julia Palmer QQV:[id number] DOB: [birth year]   PCP: Marin Olp, MD  Patient is from: Home.  Lives with husband.  DOA: 08/06/2022 LOS: 4  Chief complaints Chief Complaint  Patient presents with   Weakness   Abdominal Pain     Brief Narrative / Interim history: 81 year old F with PMH of Lewy body dementia, anxiety, depression, bilateral rib fractures and recurrent falls presenting with generalized weakness and recurrent fall, and admitted for dehydration, generalized weakness, elevated liver enzymes and pleural effusion.  She had pyuria on UA and started on IV ceftriaxone.  Urine culture with multiple species.  Patient underwent thoracocentesis for pleural effusion.  Fluid culture NGTD.  Remains confused and altered.  Encephalopathy work-up unrevealing.  Palliative medicine consulted on 08/08/2022, and patient was transitioned to full comfort care.  Subjective: Seen and examined earlier this morning.  Hospice RN at bedside.  Patient was a little tachycardic, tachypneic and hypertensive.  She was startled to gentle touch.  Looks anxious.  She mumbles and difficult to understand what she says.  Objective: Vitals:   08/09/22 0700 08/09/22 2015 08/10/22 0327 08/10/22 1331  BP: (!) 137/104 (!) 138/92 (!) 145/100 (!) 135/92  Pulse: 80 84 (!) 117 (!) 109  Resp: 18 20 20 20   Temp: 97.8 F (36.6 C) 98.7 F (37.1 C) 98.7 F (37.1 C) 99.6 F (37.6 C)  TempSrc: Oral Oral Oral Oral  SpO2: 93% 97% 98% 96%  Weight:      Height:        Examination:  GENERAL: No apparent distress.  Appears anxious. RESP: On room air.  Slightly increased respiratory rate. CVS: Cardiac to 110s.  Heart sounds normal.  NEURO: Awake. PSYCH: Appears anxious.  Procedures:  None  Microbiology summarized: Pleural fluid culture NGTD Urine culture with multiple species.  Assessment and plan: Principal Problem:   Generalized weakness Active Problems:   Lewy body dementia with  behavioral disturbance (HCC)   Anxiety and depression   Dehydration   Recurrent falls   Multiple rib fractures   Pyuria   Elevated liver enzymes   DNR (do not resuscitate)   Goals of care, counseling/discussion   Malnutrition of moderate degree   Pleural effusion, left   High risk medication use   Agitation   Pain   Lewy body dementia (HCC)   Coordination of complex care   Palliative care encounter   End of life care  End-of-life care: Appears comfortable except for intermittent jerking although this is not new..  -Comfort care pathway with as needed medication -Appreciate help by palliative medicine -Continue as needed meds for anxiety, pain, air hunger, dry eye and oral care -Plan for transfer to residential hospice once bed available  Generalized weakness with recurrent falls: Multifactorial including dehydration, dementia, gait disturbance, and possible infection.  Some degree of contractures in the legs on my exam.    Closed fracture multiple ribs on the left side, POA: Noted on CT on 08/03/2022.  Elevated liver enzymes: Mild.  Bili and ALP within normal.  Due to rhabdo?  CK elevated and improving.  Acute hepatitis panel and HIV negative.    Moderate left pleural effusion with left lower lobe atelectasis: Had thoracocentesis with removal of 350 cc dark, bloody fluid.  Likely hemothorax related to rib injury.  Fluid culture NGTD   Lewy body dementia without behavioral disturbance: She is oriented to self and place.  Follows some commands.  She mumbles and difficult to understand.  CT head,  TSH and B12 without significant finding.  EEG with nonspecific encephalopathy but no seizure or epilepsy.   Stable staghorn right renal calculi Pyuria: Difficult to tell if she has UTI or not.  And cannot provide history.  No fever or leukocytosis.  No suprapubic tenderness.  Received IV ceftriaxone for 3 days  Hypokalemia:  Goal of care counseling: DNR/DNI see discussion on 10/24.  Full  comfort care on 10/25   Moderate malnutrition Body mass index is 21.28 kg/m. Nutrition Problem: Moderate Malnutrition Etiology: chronic illness (dementia) Signs/Symptoms: moderate fat depletion, moderate muscle depletion Interventions: Ensure Enlive (each supplement provides 350kcal and 20 grams of protein), MVI   DVT prophylaxis:  Patient is full comfort care  Code Status: Full code Family Communication: None at bedside Level of care: Med-Surg Status is: Inpatient Remains inpatient appropriate because: End-of-life care   Final disposition: Residential hospice Consultants:  Palliative medicine  Sch Meds:  Scheduled Meds:   Continuous Infusions:   PRN Meds:.acetaminophen, glycopyrrolate, HYDROmorphone (DILAUDID) injection, lip balm, LORazepam, polyvinyl alcohol, prochlorperazine  Antimicrobials: Anti-infectives (From admission, onward)    Start     Dose/Rate Route Frequency Ordered Stop   08/07/22 0630  cefTRIAXone (ROCEPHIN) 2 g in sodium chloride 0.9 % 100 mL IVPB  Status:  Discontinued        2 g 200 mL/hr over 30 Minutes Intravenous Every 24 hours 08/07/22 0608 08/08/22 1619        I have personally reviewed the following labs and images: CBC: Recent Labs  Lab 08/06/22 1618 08/07/22 0648 08/08/22 0342  WBC 8.8 8.7 8.7  NEUTROABS 6.6  --   --   HGB 13.0 11.7* 13.0  HCT 40.1 36.0 41.4  MCV 97.3 97.8 100.7*  PLT 301 284 298   BMP &GFR Recent Labs  Lab 08/06/22 1618 08/07/22 0648 08/08/22 0554  NA 141 139 142  K 3.9 3.4* 3.2*  CL 107 109 110  CO2 24 22 24   GLUCOSE 115* 85 101*  BUN 26* 23 18  CREATININE 0.90 0.78 0.57  CALCIUM 9.2 8.5* 8.5*  MG  --  2.2 2.3  PHOS  --  2.8 2.5   Estimated Creatinine Clearance: 49.2 mL/min (by C-G formula based on SCr of 0.57 mg/dL). Liver & Pancreas: Recent Labs  Lab 08/06/22 1618 08/07/22 0648 08/08/22 0554  AST 99* 83* 70*  ALT 119* 99* 87*  ALKPHOS 65 58 60  BILITOT 1.0 1.1 0.9  PROT 7.3 6.5 6.6   ALBUMIN 3.9 3.4* 3.3*   Recent Labs  Lab 08/06/22 1618  LIPASE 37   Recent Labs  Lab 08/07/22 1846  AMMONIA 14   Diabetic: No results for input(s): "HGBA1C" in the last 72 hours. No results for input(s): "GLUCAP" in the last 168 hours. Cardiac Enzymes: Recent Labs  Lab 08/07/22 0652 08/08/22 0554  CKTOTAL 1,177* 688*   No results for input(s): "PROBNP" in the last 8760 hours. Coagulation Profile: No results for input(s): "INR", "PROTIME" in the last 168 hours. Thyroid Function Tests: Recent Labs    08/07/22 1846  TSH 1.031   Lipid Profile: No results for input(s): "CHOL", "HDL", "LDLCALC", "TRIG", "CHOLHDL", "LDLDIRECT" in the last 72 hours. Anemia Panel: Recent Labs    08/07/22 1825 08/07/22 1846  VITAMINB12  --  2,155*  FOLATE  --  28.2  FERRITIN  --  137  TIBC  --  288  IRON  --  41  RETICCTPCT 1.3  --    Urine analysis:    Component  Value Date/Time   COLORURINE YELLOW 08/07/2022 0517   APPEARANCEUR CLEAR 08/07/2022 0517   LABSPEC >1.046 (H) 08/07/2022 0517   PHURINE 5.0 08/07/2022 0517   GLUCOSEU NEGATIVE 08/07/2022 0517   HGBUR MODERATE (A) 08/07/2022 0517   BILIRUBINUR NEGATIVE 08/07/2022 0517   BILIRUBINUR neg 08/24/2021 1104   KETONESUR 20 (A) 08/07/2022 0517   PROTEINUR 30 (A) 08/07/2022 0517   UROBILINOGEN 0.2 08/24/2021 1104   NITRITE NEGATIVE 08/07/2022 0517   LEUKOCYTESUR LARGE (A) 08/07/2022 0517   Sepsis Labs: Invalid input(s): "PROCALCITONIN", "LACTICIDVEN"  Microbiology: Recent Results (from the past 240 hour(s))  Urine Culture     Status: None   Collection Time: 08/03/22 12:12 PM   Specimen: Urine  Result Value Ref Range Status   MICRO NUMBER: [id number]  Final   SPECIMEN QUALITY: Adequate  Final   Sample Source URINE, CLEAN CATCH  Final   STATUS: FINAL  Final   Result:   Final    Mixed genital flora isolated. These superficial bacteria are not indicative of a urinary tract infection. No further organism identification is  warranted on this specimen. If clinically indicated, recollect clean-catch, mid-stream urine and transfer  immediately to Urine Culture Transport Tube.   Urine Culture     Status: Abnormal   Collection Time: 08/07/22  6:52 AM   Specimen: Urine, Clean Catch  Result Value Ref Range Status   Specimen Description   Final    URINE, CLEAN CATCH Performed at [facility], [address], [place], [place] [address]    Special Requests   Final    NONE Performed at [facility], [address], [place], [place] [address]    Culture MULTIPLE SPECIES PRESENT, SUGGEST RECOLLECTION (A)  Final   Report Status 08/08/2022 FINAL  Final  Body fluid culture w Gram Stain     Status: None   Collection Time: 08/07/22 11:30 AM   Specimen: PATH Cytology Peritoneal fluid  Result Value Ref Range Status   Specimen Description   Final    PERITONEAL Performed at [facility], [address], [place], [place] [address]    Special Requests   Final    NONE Performed at [facility], [address] [place], [place] [address]    Gram Stain NO WBC SEEN NO ORGANISMS SEEN   Final   Culture   Final    NO GROWTH 3 DAYS Performed at [facility], [address], [place], [place] [address]    Report Status 08/10/2022 FINAL  Final    Radiology Studies: No results found.    Deaun Rocha T. Tyrees Chopin [facility]  If 7PM-7AM, please contact night-coverage [email or web address] 08/10/2022, 6:08 PM

## 2022-08-10 NOTE — Progress Notes (Signed)
Daily Progress Note   Patient Name: Julia Palmer       Date: 08/10/2022 DOB: 04/20/1941  Age: 81 y.o. MRN#: [id number] Attending Physician: Mercy Riding, MD Primary Care Physician: Marin Olp, MD Admit Date: 08/06/2022 Length of Stay: 4 days  Reason for Consultation/Follow-up: Establishing goals of care and symptom management  Subjective:   CC: Patient unable to participate in conversation due to her underlying medical status.  Discussion held with patient's husband and daughter at bedside.   Subjective: Discussed care with [facility] hospice liaison prior to seeing patient this morning.  Patient does not have a bed available at [facility].  Presented to bedside to check on patient's symptom burden.  Patient lying in bed.  Patient not able to verbally communicate.  Patient showing nonverbal signs of pain such as grimacing, sweating, increased work of breathing with accessory muscle use, and overall appears uncomfortable.  Patient's husband and daughter were at bedside.  Family concurred that patient looked uncomfortable and agreed with continuing to provide IV medications for symptom management.  Updated them that patient would be provided with further Ativan and opioids to assist with management of her uncontrolled symptoms at this time.  Family will continue talking to [facility] about possible bed offer.  Offered emotional support.  All questions answered at that time.  Discussed care with bedside RN after visit to provide medications for uncontrolled symptoms.  Review of Systems Unable to obtain ROS due to patient's mentation though showing non-verbal signs of pain such as grimacing and increased work of breathing with accessory muscle use.   Objective:   Vital Signs:  BP (!) 145/100 (BP Location: Left Arm)   Pulse (!) 117   Temp 98.7 F (37.1 C) (Oral)   Resp 20   Ht 5\' 4"  (1.626 m)   Wt 56.2 kg   SpO2 98%   BMI 21.28 kg/m   Physical Exam: General: Laying in  bed, appears uncomfortable, grimacing, increased work of breathing with accessory muscle use noted Eyes: No drainage noted HENT: Dry mucous membranes Cardiovascular: RRR Respiratory: Increased work of breathing with accessory muscle use noted Abdomen: not distended Extremities: no edema in LE b/l Skin: no rashes or lesions on visible skin Neuro: Lethargic though still slightly agitated  Assessment & Plan:   Assessment: Patient is a 81 year old female with a past medical history of Lewy body dementia, anxiety, depression, bilateral rib fractures, and recurrent falls who was admitted on 10/23 for generalized weakness and dehydration.  During hospitalization patient underwent a thoracentesis for pleural effusion.  Patient has undergone extensive work-up for encephalopathy, in the setting of Lewy body dementia, which has been unrevealing and show no reversible size of worsening confusion.  Palliative care consulted to assist with complex medical decision-making.   Recommendations/Plan: # Complex medical decision making/goals of care:   -Patient unable to participate in conversation due to underlying dementia.  -Patient was already transitioned to comfort care focus on 10/25.  Patient being evaluated by [facility]. -  Code Status: DNR  Prognosis: Days to short weeks                               # Symptom management                -Pain, nonverbal and generalized in the setting of end-of-life care  Patient appeared uncomfortable today when seen initially showing nonverbal signs of pain.  Next bedside RN to provide medication for management.                -Continue hydromorphone IV to 0.5-1 mg every hour as needed pain or dyspnea.  Continue to adjust based on patient's symptom burden.                  -Anxiety/agitation, currently severe in the setting of end-of-life care  Discussed with bedside RN continuing to provide medication for management.                                -Continue IV Ativan 0.5 mg every 4 hours as needed.  Continue to adjust based on patient's symptom burden.  Avoiding Haldol with patient's underlying Lewy body dementia diagnosis.  # Psychosocial Support: -Support System: Patient's husband and daughter.  # Discharge Planning:   -[facility] evaluating  Discussed with: Family, chaplain, RN, hospitalist, case management  Thank you for allowing the palliative care team to participate in the care Julia Palmer.  Alvester Morin, DO Palliative Care Provider PMT # [phone]  If patient remains symptomatic despite maximum doses, please call PMT at [phone] between 0700 and 1900. Outside of these hours, please call attending, as PMT does not have night coverage.  This provider spent a total of 36 minutes providing patient's care.  Includes review of EMR, discussing care with other staff members involved in patient's medical care, obtaining relevant history and information from patient and/or patient's family, and personal review of imaging and lab work. Greater than 50% of the time was spent counseling and coordinating care related to the above assessment and plan.

## 2022-08-10 NOTE — Progress Notes (Signed)
Chaplain came to offer follow-up. Pt resting and no family present.  Rev.Tamsen Snider Pager [phone]

## 2022-08-10 NOTE — Plan of Care (Signed)
  Problem: Education: Goal: Knowledge of General Education information will improve Description Including pain rating scale, medication(s)/side effects and non-pharmacologic comfort measures Outcome: Progressing   Problem: Health Behavior/Discharge Planning: Goal: Ability to manage health-related needs will improve Outcome: Progressing   

## 2022-08-11 DIAGNOSIS — Z515 Encounter for palliative care: Secondary | ICD-10-CM | POA: Diagnosis not present

## 2022-08-11 DIAGNOSIS — G3183 Dementia with Lewy bodies: Secondary | ICD-10-CM | POA: Diagnosis not present

## 2022-08-11 DIAGNOSIS — R531 Weakness: Secondary | ICD-10-CM | POA: Diagnosis not present

## 2022-08-11 DIAGNOSIS — F02818 Dementia in other diseases classified elsewhere, unspecified severity, with other behavioral disturbance: Secondary | ICD-10-CM | POA: Diagnosis not present

## 2022-08-11 LAB — VITAMIN B1: Vitamin B1 (Thiamine): 135.9 nmol/L (ref 66.5–200.0)

## 2022-08-11 MED ORDER — LORAZEPAM 2 MG/ML IJ SOLN: 0.5000 mg | INTRAMUSCULAR | 0 refills | Status: AC | PRN

## 2022-08-11 MED ORDER — LORAZEPAM 2 MG/ML IJ SOLN: 0.5000 mg | INTRAMUSCULAR | Status: DC | PRN

## 2022-08-11 NOTE — Progress Notes (Signed)
Engineer, maintenance [facility]) Hospital Liaison note.   Received request from [place] for family interest in [facility] with request for transfer today. Chart reviewed and eligibility confirmed.   Met with daughter to confirm interest and explain services. Family agreeable to transfer today. CSW aware.  Registration paperwork completed will be completed by daughter and spouse at 1pm.   RN please call report to [phone].  TOC Please arrange PTAR for patient once consents are complete.   Thank you,   Clementeen Hoof, DNP, RN   [place] (listed on AMION under Hospice and [place] [facility] [place]   [phone]

## 2022-08-11 NOTE — TOC Progression Note (Signed)
Transition of Care [facility]) - Progression Note    Patient Details  Name: SIBYL MIKULA MRN: [id number] Date of Birth: 15-Mar-1941  Transition of Care [facility]) CM/SW Contact  Ross Ludwig, [place] Phone Number: 08/11/2022, 11:20 AM  Clinical Narrative:     CSW was informed that [facility] has a bed for patient awaiting completion of DC summary and consents.  Expected Discharge Plan: [place] Barriers to Discharge: Continued Medical Work up  Expected Discharge Plan and Services Expected Discharge Plan: [place] In-house Referral: Hospice / Palliative Care Discharge Planning Services: CM Consult   Living arrangements for the past 2 months: Single Family Home Expected Discharge Date: 08/11/22                                     Social Determinants of Health (SDOH) Interventions    Readmission Risk Interventions     No data to display

## 2022-08-11 NOTE — Progress Notes (Signed)
Daily Progress Note   Patient Name: Julia Palmer       Date: 08/11/2022 DOB: 24-Nov-1940  Age: 81 y.o. MRN#: [id number] Attending Physician: Mercy Riding, MD Primary Care Physician: Marin Olp, MD Admit Date: 08/06/2022 Length of Stay: 5 days  Reason for Consultation/Follow-up: Establishing goals of care and symptom management  Subjective:   CC: Patient unable to participate in conversation due to her underlying medical status.  Following up regarding patient's end-of-life symptom management.  Subjective: Reviewed EMR prior to seeing patient.  At time of EMR review, patient had received IV Dilaudid 0.5 mg x 2 doses, IV Dilaudid 1 mg x 1 dose, IV Ativan 0.5 mg x 2 doses.   Presented to bedside this AM to check on patient's symptom burden.  Patient lying in bed.  No family present at bedside.  Patient not able to verbally communicate.  Patient showing signs of uncontrolled symptoms such as sweating, increased work of breathing, and slight agitation.  Pulse also noted to be elevated showing nonverbal signs of uncontrolled symptoms.  Patient's temperature also elevated up to 99.6 F in the past 24 hours, likely fevering in the setting of end-of-life.  Discussed with bedside RN providing IV Dilaudid and Ativan. On examination this morning, patient's toes cool to touch and showing mottling signs of color skin change. Reviewed I&Os.  Patient has had 0 intake with decreasing urine output.  Review of Systems Unable to obtain ROS due to patient's mentation.  Objective:   Vital Signs:  BP (!) 141/95 (BP Location: Left Arm)   Pulse (!) 108   Temp 99.2 F (37.3 C) (Oral)   Resp 18   Ht 5\' 4"  (1.626 m)   Wt 56.2 kg   SpO2 97%   BMI 21.28 kg/m   Physical Exam: General: Laying in bed,  increased work of breathing with accessory muscle use noted Eyes: No drainage noted HENT: Dry mucous membranes Cardiovascular: RRR Respiratory: Increased work of breathing with accessory muscle  use noted Abdomen: not distended Extremities: no edema in LE b/l, toes cool to touch bilaterally Skin: Feet showing signs of mottling Neuro: Appears anxious as has darting eye movement and appears to have pain with touch  Assessment & Plan:   Assessment: Patient is a 81 year old female with a past medical history of Lewy body dementia, anxiety, depression, bilateral rib fractures, and recurrent falls who was admitted on 10/23 for generalized weakness and dehydration.  During hospitalization patient underwent a thoracentesis for pleural effusion.  Patient has undergone extensive work-up for encephalopathy, in the setting of Lewy body dementia, which has been unrevealing and show no reversible size of worsening confusion.  Palliative care consulted to assist with complex medical decision-making.   Recommendations/Plan: # Complex medical decision making/goals of care:   -Patient unable to participate in conversation due to underlying dementia.  -Patient was already transitioned to comfort care focus on 10/25.  Patient continues to be evaluated by [facility]. -  Code Status: DNR  Prognosis: Days to short weeks                               # Symptom management                -Pain, nonverbal and generalized in the setting of end-of-life care                Patient showing signs of needing further management with  IV medications.   -Continue hydromorphone IV to 0.5-1 mg every hour as needed pain or dyspnea.  Continue to adjust based on patient's symptom burden.                  -Anxiety/agitation, currently severe in the setting of end-of-life care  Patient showing signs of needing further management with IV medications.                -Change IV Ativan to  0.5-1mg  every 4 hours as needed.  Increasing available dose as concerned agitation may be uncontrolled for full 4-hour time period with 0.5 mg dose.  Continue to adjust based on patient's symptom burden.  Avoiding Haldol with patient's  underlying Lewy body dementia diagnosis.  # Psychosocial Support: -Support System: Patient's husband and daughter.  # Discharge Planning:   -[facility] continues to evaluate  Discussed with: Bedside RN  Thank you for allowing the palliative care team to participate in the care Genevie Cheshire.  Alvester Morin, DO Palliative Care Provider PMT # [phone]  If patient remains symptomatic despite maximum doses, please call PMT at [phone] between 0700 and 1900. Outside of these hours, please call attending, as PMT does not have night coverage.

## 2022-08-11 NOTE — Discharge Summary (Signed)
Physician Discharge Summary  [name]:[id number] DOB: 09-19-1941 DOA: 08/06/2022  PCP: Marin Olp, MD  Admit date: 08/06/2022 Discharge date: 08/11/2022 Admitted From: Home Disposition: Residential hospice   Discharge Condition: Stable for transfer CODE STATUS: DNR/DNI   Hospital course 81 year old F with PMH of Lewy body dementia, anxiety, depression, bilateral rib fractures and recurrent falls presenting with generalized weakness and recurrent fall, and admitted for dehydration, generalized weakness, elevated liver enzymes and pleural effusion.  She had pyuria on UA and started on IV ceftriaxone.  Urine culture with multiple species.   Patient underwent thoracocentesis for pleural effusion.  Fluid culture NGTD.  Remains confused and altered.  Encephalopathy work-up unrevealing.   Palliative medicine consulted on 08/08/2022, and patient was transitioned to full comfort care.  She is transferred to residential hospice on 08/11/2022.  Stable for transfer.  See individual problem list below for more.   Problems addressed during this hospitalization Principal Problem:   Generalized weakness Active Problems:   Lewy body dementia with behavioral disturbance (HCC)   Anxiety and depression   Dehydration   Recurrent falls   Multiple rib fractures   Pyuria   Elevated liver enzymes   DNR (do not resuscitate)   Goals of care, counseling/discussion   Malnutrition of moderate degree   Pleural effusion, left   High risk medication use   Agitation   Pain   Lewy body dementia (HCC)   Coordination of complex care   Palliative care encounter   End of life care  Nutrition Problem: Moderate Malnutrition Etiology: chronic illness (dementia) Signs/Symptoms: moderate fat depletion, moderate muscle depletion Interventions: Ensure Enlive (each supplement provides 350kcal and 20 grams of protein), MVI     Vital signs Vitals:   08/10/22 0327 08/10/22 1331 08/10/22  2015 08/11/22 1130  BP: (!) 145/100 (!) 135/92 (!) 141/95 (!) 151/95  Pulse: (!) 117 (!) 109 (!) 108 100  Temp: 98.7 F (37.1 C) 99.6 F (37.6 C) 99.2 F (37.3 C) 98.2 F (36.8 C)  Resp: 20 20 18 14   Height:      Weight:      SpO2: 98% 96% 97% 97%  TempSrc: Oral Oral Oral Oral  BMI (Calculated):         Discharge exam  GENERAL: No apparent distress. HEENT: MMM.  Vision and hearing grossly intact.  NECK: Supple.  No apparent JVD.  RESP: On room air.  No IWOB.  Fair aeration bilaterally. CVS:  RRR. Heart sounds normal.  MSK/EXT:  No apparent deformity. No edema.  NEURO: Awake and alert.  Not oriented.  Does not follow commands. PSYCH: Calm.  No distress or agitation.  Discharge Instructions  Allergies as of 08/11/2022       Reactions   Codeine Nausea And Vomiting   REACTION: Nausea, Vomitting        Medication List     STOP taking these medications    acetaminophen 500 MG tablet Commonly known as: TYLENOL Replaced by: acetaminophen 650 MG suppository   cholecalciferol 25 MCG (1000 UNIT) tablet Commonly known as: VITAMIN D3   donepezil 10 MG tablet Commonly known as: ARICEPT   escitalopram 10 MG tablet Commonly known as: LEXAPRO   ICAPS AREDS 2 PO   memantine 10 MG tablet Commonly known as: NAMENDA   traMADol 50 MG tablet Commonly known as: ULTRAM       TAKE these medications    acetaminophen 650 MG suppository Commonly known as: TYLENOL Place 1 suppository (650 mg total)  rectally every 6 (six) hours as needed for fever or mild pain. Replaces: acetaminophen 500 MG tablet   glycopyrrolate 0.2 MG/ML injection Commonly known as: ROBINUL Inject 1 mL (0.2 mg total) into the vein every 4 (four) hours as needed (excessive secretions).   HYDROmorphone 1 MG/ML injection Commonly known as: DILAUDID Inject 0.5-1 mLs (0.5-1 mg total) into the vein every hour as needed for moderate pain or severe pain (dyspnea).   LORazepam 2 MG/ML injection Commonly  known as: ATIVAN Inject 0.25-0.5 mLs (0.5-1 mg total) into the vein every 4 (four) hours as needed for anxiety (agitation).   polyvinyl alcohol 1.4 % ophthalmic solution Commonly known as: LIQUIFILM TEARS Place 1 drop into both eyes 4 (four) times daily as needed for dry eyes.   prochlorperazine 10 MG/2ML injection Commonly known as: COMPAZINE Inject 1 mL (5 mg total) into the vein every 6 (six) hours as needed.        Consultations: Palliative medicine  Procedures/Studies:  EEG adult  Result Date: 08/08/2022 Charlsie Quest, MD     08/08/2022  2:04 PM Patient Name: Julia Palmer MRN: [id number] Epilepsy Attending: Charlsie Quest Referring Physician/Provider: Almon Hercules, MD Date: 08/08/2022 Duration: 24.01 mins Patient history: 81 year old female with altered mental status.  EEG to evaluate for seizure. Level of alertness: Awake AEDs during EEG study: None Technical aspects: This EEG study was done with scalp electrodes positioned according to the 10-20 International system of electrode placement. Electrical activity was reviewed with band pass filter of 1-70Hz , sensitivity of 7 uV/mm, display speed of 13mm/sec with a 60Hz  notched filter applied as appropriate. EEG data were recorded continuously and digitally stored.  Video monitoring was available and reviewed as appropriate. Description: The posterior dominant rhythm consists of 7.5Hz  activity of moderate voltage (25-35 uV) seen predominantly in posterior head regions, symmetric and reactive to eye opening and eye closing. EEG showed continuous generalized 5 to 7 Hz theta slowing. Hyperventilation and photic stimulation were not performed.   ABNORMALITY - Continuous slow, generalized IMPRESSION: This study is suggestive of mild diffuse encephalopathy, nonspecific etiology. No seizures or epileptiform discharges were seen throughout the recording. Priyanka   CT HEAD WO CONTRAST (Annabelle Harman)  Result Date: 08/08/2022 CLINICAL  DATA:  Increased confusion EXAM: CT HEAD WITHOUT CONTRAST TECHNIQUE: Contiguous axial images were obtained from the base of the skull through the vertex without intravenous contrast. RADIATION DOSE REDUCTION: This exam was performed according to the departmental dose-optimization program which includes automated exposure control, adjustment of the mA and/or kV according to patient size and/or use of iterative reconstruction technique. COMPARISON:  09/14/2021 FINDINGS: Brain: No evidence of acute infarction, hemorrhage, mass, mass effect, or midline shift. No hydrocephalus or extra-axial fluid collection. Age related cerebral atrophy. Vascular: No hyperdense vessel. Skull: Normal. Negative for fracture or focal lesion. Sinuses/Orbits: No acute finding. Other: The mastoid air cells are well aerated. IMPRESSION: No acute intracranial process. Electronically Signed   By: 14/10/2020 M.D.   On: 08/08/2022 01:16   DG CHEST PORT 1 VIEW  Result Date: 08/07/2022 CLINICAL DATA:  Status post left thoracentesis EXAM: PORTABLE CHEST - 1 VIEW COMPARISON:  08/06/2022 FINDINGS: Cardiomediastinal silhouette and pulmonary vasculature are normal in appearance. Right lung is clear. Interval reduction of left pleural effusion with minimal effusion remaining. Delete that Minimal left basilar atelectasis. Multiple fractured left ribs again seen. IMPRESSION: No pneumothorax status post left thoracentesis. Electronically Signed   By: 08/08/2022 M.D.   On: 08/07/2022  14:06   US THORACENTESIS ASP PLEURAL SPACE W/IMG GUIDE  Result Date: 08/07/2022 INDICATION: Patient with history of low vitamin and multiple recurrent falls. Imaging shows numerous left rib fractures involving 6, 7 and possibly eighth rib. CT found moderate left pleural effusion. Request received for diagnostic and therapeutic left thoracentesis. EXAM: ULTRASOUND GUIDED DIAGNOSTIC AND THERAPEUTIC LEFT THORACENTESIS MEDICATIONS: 10 mL 1 % lidocaine COMPLICATIONS:  None immediate. PROCEDURE: An ultrasound guided thoracentesis was thoroughly discussed with the patient and questions answered. The benefits, risks, alternatives and complications were also discussed. The patient understands and wishes to proceed with the procedure. Written consent was obtained. Ultrasound was performed to localize and mark an adequate pocket of fluid in the left chest. The area was then prepped and draped in the normal sterile fashion. 1% Lidocaine was used for local anesthesia. Under ultrasound guidance a 6 Fr Safe-T-Centesis catheter was introduced. Thoracentesis was performed. The catheter was removed and a dressing applied. FINDINGS: A total of approximately 350 cc of dark, bloody fluid was removed. Samples were sent to the laboratory as requested by the clinical team. IMPRESSION: Successful ultrasound guided left thoracentesis yielding 350 cc of pleural fluid. Read by: Alex Gardener, AGNP-BC Electronically Signed   By: Irish Lack M.D.   On: 08/07/2022 12:20   CT ABDOMEN PELVIS W CONTRAST  Result Date: 08/06/2022 CLINICAL DATA:  Abdominal pain, dementia, history of colitis, multiple falls EXAM: CT ABDOMEN AND PELVIS WITH CONTRAST TECHNIQUE: Multidetector CT imaging of the abdomen and pelvis was performed using the standard protocol following bolus administration of intravenous contrast. RADIATION DOSE REDUCTION: This exam was performed according to the departmental dose-optimization program which includes automated exposure control, adjustment of the mA and/or kV according to patient size and/or use of iterative reconstruction technique. CONTRAST:  OMNIPAQUE IOHEXOL 300 MG/ML  SOLN COMPARISON:  07/05/2022, 08/06/2022 FINDINGS: Lower chest: Dense left lower lobe consolidation consistent with atelectasis. Moderate left pleural effusion. Hypoventilatory changes at the right lung base. No pneumothorax. Hepatobiliary: Evaluation limited due to patient motion. No acute liver  abnormality. The gallbladder is grossly normal. Pancreas: Unremarkable. No pancreatic ductal dilatation or surrounding inflammatory changes. Spleen: Normal in size without focal abnormality. Adrenals/Urinary Tract: Stable right-sided staghorn calculi. No obstructive uropathy within either kidney. No evidence of acute renal injury. The adrenals and bladder are grossly unremarkable. Stomach/Bowel: No bowel obstruction or ileus. No bowel wall thickening or inflammatory change. Vascular/Lymphatic: No significant vascular findings are present. No enlarged abdominal or pelvic lymph nodes. Reproductive: Uterus and bilateral adnexa are unremarkable. Other: No free fluid or free intraperitoneal gas. No abdominal wall hernia. Musculoskeletal: There are acute left lateral sixth through ninth rib fractures. Subacute healing left posterior ninth through twelfth rib fractures are also seen, with moderate callus formation. No other acute bony abnormalities. Chronic L1 compression deformity with evidence of prior vertebral augmentation. Reconstructed images demonstrate no additional findings. IMPRESSION: 1. Acute displaced left lateral sixth through ninth rib fractures. 2. Moderate left pleural effusion, with left lower lobe atelectasis. 3. Subacute healing left posterior ninth through twelfth rib fractures. 4. Stable staghorn right renal calculi. No obstructive uropathy within either kidney. Electronically Signed   By: Sharlet Salina M.D.   On: 08/06/2022 22:43   DG Chest 2 View  Result Date: 08/06/2022 CLINICAL DATA:  Rib fractures.  Evaluate for pneumothorax. EXAM: CHEST - 2 VIEW COMPARISON:  Chest radiograph 08/03/2022 FINDINGS: Patient is rotated to the left. Stable cardiac and mediastinal contours. Aortic tortuosity. Interval development of moderate left pleural effusion  with underlying consolidation within the left lower hemithorax. No definite pneumothorax. Kyphoplasty material within the thoracolumbar spine.  Redemonstrated multiple left lower lateral displaced rib fractures. IMPRESSION: 1. Interval development of moderate left pleural effusion with underlying consolidation, likely atelectasis. 2. Redemonstrated multiple left lower lateral rib fractures. Electronically Signed   By: Annia Beltrew  Davis M.D.   On: 08/06/2022 16:24   DG Chest 2 View  Result Date: 08/06/2022 CLINICAL DATA:  Left lower quadrant abdomen pain. EXAM: CHEST - 2 VIEW COMPARISON:  July 09, 2019 FINDINGS: The heart size and mediastinal contours are within normal limits. There are displaced fractures of the left sixth and seventh and possibly eighth lateral ribs. Atelectasis at the left lung base with a small left pleural effusion is identified. The frontal film is rotated limiting evaluation for pleural line in the apex to assess pneumothorax. IMPRESSION: Displaced fractures of the left sixth and seventh and possibly eighth lateral ribs. Atelectasis at the left lung base with a small left pleural effusion is identified. The frontal film is rotated limiting evaluation for pleural line in the apex to assess pneumothorax. Recommend further evaluation with a PA chest x-ray/none rotated. These results will be called to the ordering clinician or representative by the Radiologist Assistant, and communication documented in the PACS or [employer] Dashboard. Electronically Signed   By: Sherian ReinWei-Chen  Lin M.D.   On: 08/06/2022 10:24       The results of significant diagnostics from this hospitalization (including imaging, microbiology, ancillary and laboratory) are listed below for reference.     Microbiology: Recent Results (from the past 240 hour(s))  Urine Culture     Status: None   Collection Time: 08/03/22 12:12 PM   Specimen: Urine  Result Value Ref Range Status   MICRO NUMBER: [id number]  Final   SPECIMEN QUALITY: Adequate  Final   Sample Source URINE, CLEAN CATCH  Final   STATUS: FINAL  Final   Result:   Final    Mixed genital flora isolated.  These superficial bacteria are not indicative of a urinary tract infection. No further organism identification is warranted on this specimen. If clinically indicated, recollect clean-catch, mid-stream urine and transfer  immediately to Urine Culture Transport Tube.   Urine Culture     Status: Abnormal   Collection Time: 08/07/22  6:52 AM   Specimen: Urine, Clean Catch  Result Value Ref Range Status   Specimen Description   Final    URINE, CLEAN CATCH Performed at [facility], [address] [place], [place] [address]    Special Requests   Final    NONE Performed at [facility], [address] [place], [place] [address]    Culture MULTIPLE SPECIES PRESENT, SUGGEST RECOLLECTION (A)  Final   Report Status 08/08/2022 FINAL  Final  Body fluid culture w Gram Stain     Status: None   Collection Time: 08/07/22 11:30 AM   Specimen: PATH Cytology Peritoneal fluid  Result Value Ref Range Status   Specimen Description   Final    PERITONEAL Performed at [facility], [address] [place], [place] [address]    Special Requests   Final    NONE Performed at [facility], [address] [place], [place] [address]    Gram Stain NO WBC SEEN NO ORGANISMS SEEN   Final   Culture   Final    NO GROWTH 3 DAYS Performed at [facility], [address] [place] [address]    Report Status 08/10/2022 FINAL  Final     Labs:  CBC: Recent Labs  Lab 08/06/22 1618 08/07/22 0648 08/08/22 0342  WBC 8.8 8.7 8.7  NEUTROABS 6.6  --   --   HGB 13.0 11.7* 13.0  HCT 40.1 36.0 41.4  MCV 97.3 97.8 100.7*  PLT 301 284 298   BMP &GFR Recent Labs  Lab 08/06/22 1618 08/07/22 0648 08/08/22 0554  NA 141 139 142  K 3.9 3.4* 3.2*  CL 107 109 110  CO2 24 22 24   GLUCOSE 115* 85 101*  BUN 26* 23 18  CREATININE 0.90 0.78 0.57  CALCIUM 9.2 8.5* 8.5*  MG  --  2.2 2.3  PHOS  --  2.8 2.5   Estimated  Creatinine Clearance: 49.2 mL/min (by C-G formula based on SCr of 0.57 mg/dL). Liver & Pancreas: Recent Labs  Lab 08/06/22 1618 08/07/22 0648 08/08/22 0554  AST 99* 83* 70*  ALT 119* 99* 87*  ALKPHOS 65 58 60  BILITOT 1.0 1.1 0.9  PROT 7.3 6.5 6.6  ALBUMIN 3.9 3.4* 3.3*   Recent Labs  Lab 08/06/22 1618  LIPASE 37   Recent Labs  Lab 08/07/22 1846  AMMONIA 14   Diabetic: No results for input(s): "HGBA1C" in the last 72 hours. No results for input(s): "GLUCAP" in the last 168 hours. Cardiac Enzymes: Recent Labs  Lab 08/07/22 0652 08/08/22 0554  CKTOTAL 1,177* 688*   No results for input(s): "PROBNP" in the last 8760 hours. Coagulation Profile: No results for input(s): "INR", "PROTIME" in the last 168 hours. Thyroid Function Tests: No results for input(s): "TSH", "T4TOTAL", "FREET4", "T3FREE", "THYROIDAB" in the last 72 hours. Lipid Profile: No results for input(s): "CHOL", "HDL", "LDLCALC", "TRIG", "CHOLHDL", "LDLDIRECT" in the last 72 hours. Anemia Panel: No results for input(s): "VITAMINB12", "FOLATE", "FERRITIN", "TIBC", "IRON", "RETICCTPCT" in the last 72 hours. Urine analysis:    Component Value Date/Time   COLORURINE YELLOW 08/07/2022 0517   APPEARANCEUR CLEAR 08/07/2022 0517   LABSPEC >1.046 (H) 08/07/2022 0517   PHURINE 5.0 08/07/2022 0517   GLUCOSEU NEGATIVE 08/07/2022 0517   HGBUR MODERATE (A) 08/07/2022 0517   BILIRUBINUR NEGATIVE 08/07/2022 0517   BILIRUBINUR neg 08/24/2021 1104   KETONESUR 20 (A) 08/07/2022 0517   PROTEINUR 30 (A) 08/07/2022 0517   UROBILINOGEN 0.2 08/24/2021 1104   NITRITE NEGATIVE 08/07/2022 0517   LEUKOCYTESUR LARGE (A) 08/07/2022 0517   Sepsis Labs: Invalid input(s): "PROCALCITONIN", "LACTICIDVEN"   SIGNED:  08/09/2022, MD  [facility] 08/11/2022, 11:38 AM

## 2022-08-11 NOTE — TOC Transition Note (Addendum)
Transition of Care [facility]) - CM/SW Discharge Note   Patient Details  Name: Julia Palmer MRN: [id number] Date of Birth: 1941/01/30  [facility] of [facility] CM/SW Contact:  Ross Ludwig, LCSW Phone Number: 08/11/2022, 12:38 PM   Clinical Narrative:     Patient to be d/c'ed today to [facility] .  Patient and family agreeable to plans will transport via ems RN to call report to [phone].  CSW contacted EMS at [phone], at 2:35pm for transport.     Final next level of care: [place] Barriers to Discharge: Barriers Resolved   Patient Goals and CMS Choice Patient states their goals for this hospitalization and ongoing recovery are:: To go to [facility] for end of life care. CMS Medicare.gov Compare Post Acute Care list provided to:: Patient Represenative (must comment) Choice offered to / list presented to : Adult Children  Discharge Placement              Patient chooses bed at: Other - please specify in the comment section below: ([place]) Patient to be transferred to facility by: [facility]   Patient and family notified of of transfer: 08/11/22  Discharge Plan and Services In-house Referral: Hospice / Palliative Care Discharge Planning Services: CM Consult                                 Social Determinants of Health (SDOH) Interventions     Readmission Risk Interventions     No data to display

## 2022-08-13 LAB — PH, BODY FLUID: pH, Body Fluid: 7

## 2022-08-28 ENCOUNTER — Encounter: Payer: Medicare Other | Admitting: Family Medicine

## 2022-09-14 DEATH — deceased

## 2022-12-24 ENCOUNTER — Ambulatory Visit: Payer: Medicare Other | Admitting: Neurology

## 2023-10-26 IMAGING — DX DG ABDOMEN 1V
1 series · 1 of 1 positions shown · non-contrast
Comparison: CT abdomen pelvis dated 11/23/2021.

CLINICAL DATA: Right kidney stone.  Lithotripsy.

EXAM:
ABDOMEN - 1 VIEW

[abdomen kub]
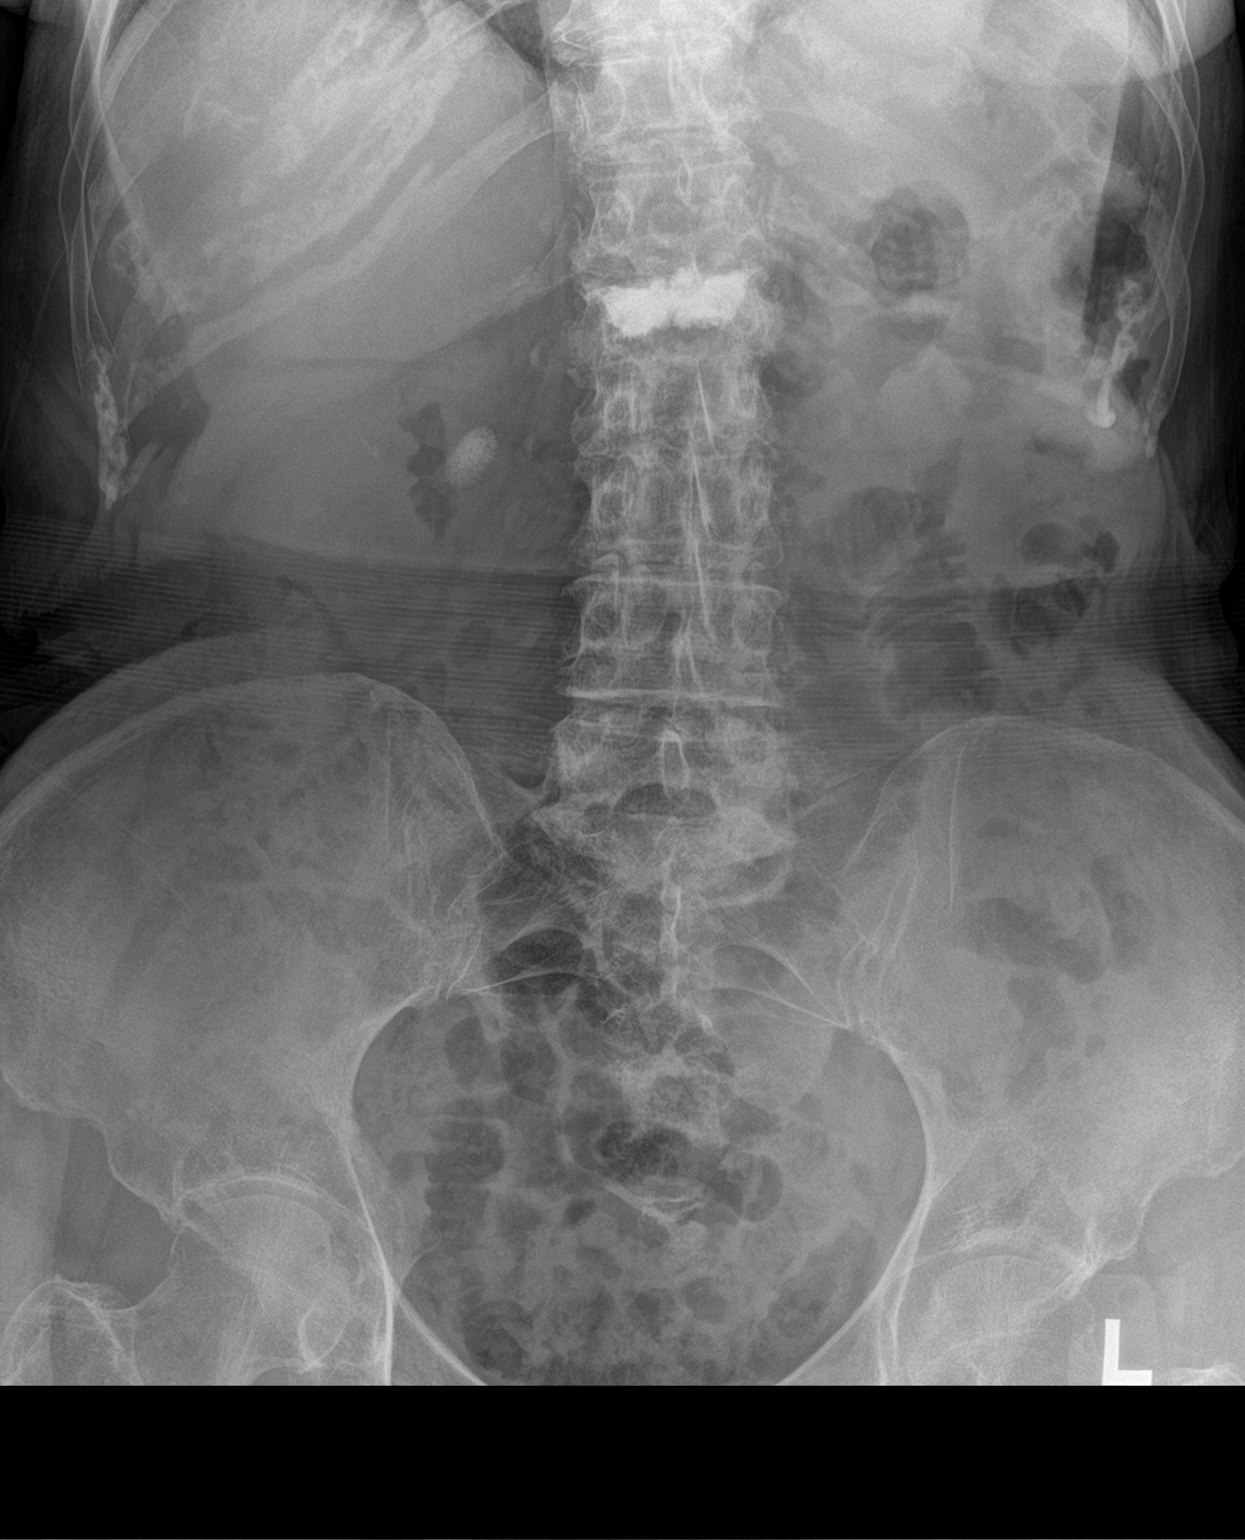

[1 of 1 positions shown; findings below may reference images not displayed]

FINDINGS: A 17 mm ovoid radiopaque stone in the region of the right renal
pelvis similar to prior CT. Additional faint right renal radiopaque
calculi noted. No radiopaque stone noted over the left flank. No
bowel dilatation or evidence of obstruction. Osteopenia with
degenerative changes of the spine and vertebroplasty. No acute
osseous pathology.
IMPRESSION: Right renal calculi as described.
